# Patient Record
Sex: Female | Born: 1965 | Race: White | Hispanic: No | Marital: Single | Smoking: Current every day smoker
Health system: Southern US, Community
[De-identification: ages and names within clinical notes are randomized; demographics above are authoritative.]

## PROBLEM LIST (undated history)

## (undated) DIAGNOSIS — L680 Hirsutism: Secondary | ICD-10-CM

## (undated) DIAGNOSIS — K529 Noninfective gastroenteritis and colitis, unspecified: Secondary | ICD-10-CM

## (undated) DIAGNOSIS — F200 Paranoid schizophrenia: Secondary | ICD-10-CM

## (undated) DIAGNOSIS — R55 Syncope and collapse: Secondary | ICD-10-CM

## (undated) DIAGNOSIS — F209 Schizophrenia, unspecified: Secondary | ICD-10-CM

## (undated) DIAGNOSIS — G4733 Obstructive sleep apnea (adult) (pediatric): Secondary | ICD-10-CM

## (undated) DIAGNOSIS — G8929 Other chronic pain: Secondary | ICD-10-CM

## (undated) DIAGNOSIS — R7309 Other abnormal glucose: Secondary | ICD-10-CM

## (undated) DIAGNOSIS — F329 Major depressive disorder, single episode, unspecified: Secondary | ICD-10-CM

## (undated) DIAGNOSIS — Z9981 Dependence on supplemental oxygen: Secondary | ICD-10-CM

## (undated) DIAGNOSIS — R569 Unspecified convulsions: Secondary | ICD-10-CM

## (undated) DIAGNOSIS — N200 Calculus of kidney: Secondary | ICD-10-CM

## (undated) DIAGNOSIS — M7918 Myalgia, other site: Secondary | ICD-10-CM

## (undated) DIAGNOSIS — F32A Depression, unspecified: Secondary | ICD-10-CM

## (undated) DIAGNOSIS — K219 Gastro-esophageal reflux disease without esophagitis: Secondary | ICD-10-CM

## (undated) DIAGNOSIS — R51 Headache: Secondary | ICD-10-CM

## (undated) DIAGNOSIS — I2510 Atherosclerotic heart disease of native coronary artery without angina pectoris: Secondary | ICD-10-CM

## (undated) DIAGNOSIS — C3490 Malignant neoplasm of unspecified part of unspecified bronchus or lung: Secondary | ICD-10-CM

## (undated) DIAGNOSIS — J189 Pneumonia, unspecified organism: Secondary | ICD-10-CM

## (undated) DIAGNOSIS — N189 Chronic kidney disease, unspecified: Secondary | ICD-10-CM

## (undated) DIAGNOSIS — Z8619 Personal history of other infectious and parasitic diseases: Secondary | ICD-10-CM

## (undated) DIAGNOSIS — M5134 Other intervertebral disc degeneration, thoracic region: Secondary | ICD-10-CM

## (undated) DIAGNOSIS — C3412 Malignant neoplasm of upper lobe, left bronchus or lung: Secondary | ICD-10-CM

## (undated) DIAGNOSIS — Z87442 Personal history of urinary calculi: Secondary | ICD-10-CM

## (undated) DIAGNOSIS — I739 Peripheral vascular disease, unspecified: Secondary | ICD-10-CM

## (undated) DIAGNOSIS — R519 Headache, unspecified: Secondary | ICD-10-CM

## (undated) DIAGNOSIS — R030 Elevated blood-pressure reading, without diagnosis of hypertension: Secondary | ICD-10-CM

## (undated) DIAGNOSIS — F4310 Post-traumatic stress disorder, unspecified: Secondary | ICD-10-CM

## (undated) DIAGNOSIS — E66813 Obesity, class 3: Secondary | ICD-10-CM

## (undated) DIAGNOSIS — K625 Hemorrhage of anus and rectum: Secondary | ICD-10-CM

## (undated) DIAGNOSIS — Z860101 Personal history of adenomatous and serrated colon polyps: Secondary | ICD-10-CM

## (undated) DIAGNOSIS — J449 Chronic obstructive pulmonary disease, unspecified: Secondary | ICD-10-CM

## (undated) DIAGNOSIS — I890 Lymphedema, not elsewhere classified: Secondary | ICD-10-CM

## (undated) DIAGNOSIS — G2581 Restless legs syndrome: Secondary | ICD-10-CM

## (undated) DIAGNOSIS — M5030 Other cervical disc degeneration, unspecified cervical region: Secondary | ICD-10-CM

## (undated) DIAGNOSIS — F17210 Nicotine dependence, cigarettes, uncomplicated: Secondary | ICD-10-CM

## (undated) DIAGNOSIS — R590 Localized enlarged lymph nodes: Secondary | ICD-10-CM

## (undated) DIAGNOSIS — G4730 Sleep apnea, unspecified: Secondary | ICD-10-CM

## (undated) DIAGNOSIS — I872 Venous insufficiency (chronic) (peripheral): Secondary | ICD-10-CM

## (undated) HISTORY — DX: Schizophrenia, unspecified: F20.9

## (undated) HISTORY — DX: Other chronic pain: G89.29

## (undated) HISTORY — PX: TUBAL LIGATION: SHX77

## (undated) HISTORY — PX: OTHER SURGICAL HISTORY: SHX169

## (undated) HISTORY — PX: CHOLECYSTECTOMY: SHX55

## (undated) HISTORY — PX: KNEE SURGERY: SHX244

## (undated) HISTORY — PX: ESOPHAGOGASTRODUODENOSCOPY: SHX1529

## (undated) HISTORY — PX: CERVICAL SPINE SURGERY: SHX589

## (undated) HISTORY — DX: Gastro-esophageal reflux disease without esophagitis: K21.9

## (undated) HISTORY — PX: SPINE SURGERY: SHX786

## (undated) HISTORY — DX: Unspecified convulsions: R56.9

---

## 1998-02-26 ENCOUNTER — Emergency Department (HOSPITAL_COMMUNITY): Admission: EM | Admit: 1998-02-26 | Discharge: 1998-02-26 | Payer: Self-pay | Admitting: Emergency Medicine

## 1998-03-01 ENCOUNTER — Emergency Department (HOSPITAL_COMMUNITY): Admission: EM | Admit: 1998-03-01 | Discharge: 1998-03-01 | Payer: Self-pay | Admitting: Emergency Medicine

## 1998-03-09 ENCOUNTER — Emergency Department (HOSPITAL_COMMUNITY): Admission: EM | Admit: 1998-03-09 | Discharge: 1998-03-09 | Payer: Self-pay | Admitting: Emergency Medicine

## 2006-08-14 DIAGNOSIS — G8929 Other chronic pain: Secondary | ICD-10-CM | POA: Insufficient documentation

## 2006-11-06 ENCOUNTER — Emergency Department: Payer: Self-pay | Admitting: Emergency Medicine

## 2006-11-09 ENCOUNTER — Ambulatory Visit (HOSPITAL_BASED_OUTPATIENT_CLINIC_OR_DEPARTMENT_OTHER): Admission: RE | Admit: 2006-11-09 | Discharge: 2006-11-09 | Payer: Self-pay | Admitting: Orthopedic Surgery

## 2007-08-05 ENCOUNTER — Emergency Department: Payer: Self-pay | Admitting: Internal Medicine

## 2007-08-12 ENCOUNTER — Emergency Department: Payer: Self-pay | Admitting: Emergency Medicine

## 2007-10-17 ENCOUNTER — Emergency Department: Payer: Self-pay | Admitting: Emergency Medicine

## 2007-10-18 ENCOUNTER — Other Ambulatory Visit: Payer: Self-pay

## 2007-11-20 ENCOUNTER — Emergency Department: Payer: Self-pay | Admitting: Internal Medicine

## 2008-07-04 ENCOUNTER — Encounter: Admission: RE | Admit: 2008-07-04 | Discharge: 2008-07-09 | Payer: Self-pay | Admitting: Anesthesiology

## 2008-07-09 ENCOUNTER — Ambulatory Visit: Payer: Self-pay | Admitting: Anesthesiology

## 2008-07-19 ENCOUNTER — Ambulatory Visit (HOSPITAL_COMMUNITY): Admission: RE | Admit: 2008-07-19 | Discharge: 2008-07-19 | Payer: Self-pay | Admitting: Anesthesiology

## 2009-01-29 ENCOUNTER — Emergency Department: Payer: Self-pay | Admitting: Emergency Medicine

## 2009-10-16 ENCOUNTER — Ambulatory Visit: Payer: Self-pay | Admitting: Family Medicine

## 2009-10-30 ENCOUNTER — Ambulatory Visit: Payer: Self-pay | Admitting: Family Medicine

## 2010-07-21 NOTE — Op Note (Signed)
Brandi Fowler, Brandi Fowler              ACCOUNT NO.:  [id number]   MEDICAL RECORD NO.:  [id number]          PATIENT TYPE:  AMB   LOCATION:  DSC                          FACILITY:  [facility]   PHYSICIAN:  Mila Homer. Sherlean Foot, M.D. DATE OF BIRTH:  Aug 06, 1965   DATE OF PROCEDURE:  11/09/2006  DATE OF DISCHARGE:                               OPERATIVE REPORT   SURGEON:  Mila Homer. Sherlean Foot, M.D.   ASSISTANT:  None.   ANESTHESIA:  MAC.   PREOPERATIVE DIAGNOSIS:  Left knee internal derangement.   POSTOPERATIVE DIAGNOSIS:  Left knee plica syndrome.   INDICATIONS FOR PROCEDURE:  The patient is a 45 year old with a worker's  compensation injury to the knee and no improvement at three months but  with a normal MRI scan.  Diagnostic arthroscopy was suggested and she  elected for me to do the procedure.  Informed consent was obtained.   DESCRIPTION OF PROCEDURE:  The patient was laid supine and administered  MAC anesthesia.  Left leg was prepped and draped in usual sterile  fashion.  Inferolateral, inferomedial portals were created with a #11  blade, blunt trocar and cannula.  Diagnostic arthroscopy revealed no  chondromalacia about the knee.  There was a large synovitic plica on the  medial side.  This was debrided.  I think this was the only pathologic  lesion in the knee.  I checked all three compartments again.  ACL, PCL  were normal.  There were no loose bodies.  I then evacuated the knee of  fluid and instruments and then placed 4-0 nylon sutures in each portal  and dressed with Xeroform dressing sponges, sterile Webril and Ace wrap.   COMPLICATIONS:  None.   DRAINS:  None.           ______________________________  Mila Homer. Sherlean Foot, M.D.     SDL/MEDQ  D:  11/09/2006  T:  11/09/2006  Job:  [id number]

## 2010-07-21 NOTE — Assessment & Plan Note (Signed)
Br[facility].  I  evaluated him and reviewed the Health and History form and 14-point  review of systems.  I examined her, talked to her, and with her  permission case manager to the room.  She is an individual with  extensive medical history, left Fowler pain 45 years old, injured at work.  Date of injury June 2008 slip.  Reviewed records inclusive of Dr.  Wells Fowler assessment and Dr. Lynelle Fowler assessment, and I have reviewed  available imaging and the questions that are opposed to the assessment  today.  Ms. Brandi Fowler arrives here, and as I arrived  here on time, as I  entered the room, I find her in pajamas, somewhat disheveled, very  anxious and pressured.  We introduced each other, and began our initial  historical elements.  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  She frequently  see the emergency department, and gets injections for pain, and  apparently has been told by the ER they will not do that much anymore.  She relates her pain is 10/10, catastrophizes to most activities of  daily living.  She describes herself as almost invalid, unable to even  make sandwich.  She known __________ from a day ongoing pain, made worse  by walking, bending, sitting, standing; improves with heat and ice as  well as  TENS unit.  She has been on a multitude of medications, and she  is not sure which medicines help her.  She states she cannot even climbs  stairs, she cannot walk more than 5 minutes.  She sees herself as  totally disabled, unable to return to any kind of work.  She has  assistance with bathing, toileting, dressing, meal prep, household  duties, and shopping.  She relates this as numbness, tingling and some  weakness, although she has no breakaway.  Associated symptoms of neck  problems are also described as spondylitic, addressed as Dr. Wells Fowler  note, and attached to chart.   I reviewed available imaging, hard copy  and report.  A 14-point review of systems.  She denies significant  medical illnesses.   SOCIAL HISTORY:  She is single, lives alone, and we talked about the  legal history, she states that she has been convicted of crime.  She  states that those were mostly vehicle climbs, and has denied any other  issues here.  She is a smoker, now down to 1 pack a day from 2 packs per  day and she started at age 44.  She sees no relationship to her smoking  and her current difficulty with well health concerns.   SURGICAL HISTORY:  Arthroscopy to the left Fowler.   FAMILY HISTORY:  Diabetes, psychiatric disease, and disability.  The  psychiatric disease is undefined and I asked her specifically about  schizophrenia and bipolar disease.  She then relates to me that she was  recently diagnosed as having schizophrenia, her family doctors sending  her to Dr. Fannie Fowler and bridged her with antidepressants.  She states no wish  to harm self or others.  She states she has heard voices when she was  very young.   Review of systems and social history otherwise noncontributory, pain  problem.   PHYSICAL EXAMINATION:  GENERAL:  The patient is disheveled, somewhat  chaotic in appearance, and pressured speech.  She is oriented x3.  She  has  a limp with her gait, but it is variable.  HEENT:  Otherwise unremarkable with exception of poor dentition.  CHEST:  Clear to auscultation and percussion, increased AP diameter.  CARDIAC:  Regular rate and rhythm without rub, murmur, or gallop.  ABDOMEN:  Obese, benign, no hepatosplenomegaly.  MUSCULOSKELETAL:  She has diffuse suprascapular, paracervical, and  paralumbar myofascial with Fortin  test positive, distractible  myofascial elements.  I observed her Fowler, the left and right Fowler are  equal temperature, and no evidence of pseudomotor changes, full range of  motion, distractible to examination.  NEUROLOGIC:  She is intact  neurologically, and one ask for active  participation in weightbearing and movement, has exaggerated pain  behaviors.   IMPRESSION:  1. Osteoarthritis of the ankle.  2. Osteoarthritis of the Fowler.  3. Myofascial pain,  cervical lumbar.   PLAN:  1. I see no evidence of CRPS at this visit.  She describes pain, and      presents exaggerated to physical findings.  2. To be complete, we are going to order three-phase bone scan which      is nonspecific, but helpful in understanding washout effect, and if      this is not true a possibility for CRPS.  It is not pathognomonic.  3. As I go to questioning, it strikes me that she has either      undiagnosed schizoaffective disorder or bipolar disease.  This is      clearly contributory to her presentation today.  She comes very      anxious and agitated at simple question line that normally would      not be initiating a confrontational tone.  Also of note, she states      that she left Dr. Vear Fowler' office when they did not get along, and      in fact, Dr. Vear Fowler also had the same problem with some of her      compulsive activities.  I can see this is problematic as well.  4. Cigarette cessation is mandatory for best outcome.  I tried to      explained to her the contributory nature of this problem, and she      does not believe if that is the case.  5. I will see her in 2 weeks and have a better opportunity to      understand where to head next.  She has been through multiple      procedures, physical therapy, multiple medication trials, and she      wants to get out of pain.  We will work towards that goal, but it      is going to be approaching from multiple perspectives.  If she is      schizoaffective, we will unlikely proceed with any intervention,      and be very careful with what we prescribed.  The schizoaffective      and bipolar disease were not related to workman's comp.  Those were      preexisting, and not associated with  the injury.  6. Ms. Brandi Fowler was not entirely truthful with me when I asked her      about her arrest history.  She has been arrested for drug      paraphernalia, use and possession, possession of schedule 4;  DWI      and a previous possession of controlled substances, and when I      asked her about that she states  that they were wrong.  She was not      convicted, it was a friend of hers and, in fact, the offender      document does not clarify that.  She does have alias.   We will see her in 2 weeks and move forward with treatment  recommendations.  At this point, I am going to hold her out of work if I  do not see her for returning to work, by her own comments, although I do  think with proper treatment she might have a potential for return to  gainful employment.  Discharge instructions given.            ______________________________  Celene Kras, MD     HH/MedQ  D:  07/09/2008 11:51:44  T:  07/10/2008 01:46:38  Job #:  [id number]

## 2010-07-24 NOTE — Assessment & Plan Note (Signed)
Brandi Fowler has had a bone scan performed, three-phase.  I have  reviewed the report.  It appears that there is no evidence of CRPS,  supporting our clinical assumption, and we will relates this to the  patient.  Unfortunately, the patient has elected not to return, and the  door will remain open.           ______________________________  Celene Kras, MD     HH/MedQ  D:  08/27/2008 12:53:25  T:  08/28/2008 01:31:06  Job #:  [id number]

## 2010-08-18 ENCOUNTER — Ambulatory Visit: Payer: Self-pay | Admitting: Pain Medicine

## 2010-09-22 ENCOUNTER — Ambulatory Visit: Payer: Self-pay | Admitting: Pain Medicine

## 2010-12-18 LAB — POCT HEMOGLOBIN-HEMACUE: Operator id: 145061

## 2011-09-28 DIAGNOSIS — E669 Obesity, unspecified: Secondary | ICD-10-CM | POA: Diagnosis present

## 2011-12-08 ENCOUNTER — Ambulatory Visit: Payer: Self-pay | Admitting: Gastroenterology

## 2011-12-10 LAB — PATHOLOGY REPORT

## 2011-12-23 ENCOUNTER — Ambulatory Visit: Payer: Self-pay | Admitting: Emergency Medicine

## 2011-12-23 LAB — HEMOGLOBIN: HGB: 13.2 g/dL (ref 12.0–16.0)

## 2011-12-30 ENCOUNTER — Ambulatory Visit: Payer: Self-pay | Admitting: Emergency Medicine

## 2011-12-31 LAB — CBC
MCHC: 33 g/dL (ref 32.0–36.0)
MCV: 96 fL (ref 80–100)
RDW: 14 % (ref 11.5–14.5)

## 2011-12-31 LAB — COMPREHENSIVE METABOLIC PANEL
Albumin: 4 g/dL (ref 3.4–5.0)
Anion Gap: 9 (ref 7–16)
BUN: 14 mg/dL (ref 7–18)
Bilirubin,Total: 0.4 mg/dL (ref 0.2–1.0)
Chloride: 107 mmol/L (ref 98–107)
EGFR (African American): >60
Osmolality: 279 (ref 275–301)
Potassium: 3.9 mmol/L (ref 3.5–5.1)
SGOT(AST): 38 U/L — ABNORMAL HIGH (ref 15–37)
Sodium: 139 mmol/L (ref 136–145)
Total Protein: 7.6 g/dL (ref 6.4–8.2)

## 2012-01-01 ENCOUNTER — Ambulatory Visit: Payer: Self-pay | Admitting: Neurology

## 2012-01-01 LAB — HEPATIC FUNCTION PANEL A (ARMC)
Albumin: 3.7 g/dL (ref 3.4–5.0)
Alkaline Phosphatase: 89 U/L (ref 50–136)
Bilirubin,Total: 0.4 mg/dL (ref 0.2–1.0)
SGOT(AST): 24 U/L (ref 15–37)
SGPT (ALT): 38 U/L (ref 12–78)
Total Protein: 7.5 g/dL (ref 6.4–8.2)

## 2012-01-01 LAB — CBC WITH DIFFERENTIAL/PLATELET
Basophil %: 0.6 %
Eosinophil #: 0 10*3/uL (ref 0.0–0.7)
HCT: 39.8 % (ref 35.0–47.0)
HGB: 14 g/dL (ref 12.0–16.0)
Lymphocyte %: 7.8 %
Monocyte %: 5.7 %
Neutrophil #: 15.2 10*3/uL — ABNORMAL HIGH (ref 1.4–6.5)
Neutrophil %: 85.8 %
RBC: 4.13 10*6/uL (ref 3.80–5.20)
WBC: 17.8 10*3/uL — ABNORMAL HIGH (ref 3.6–11.0)

## 2012-01-01 LAB — URINALYSIS, COMPLETE
Bacteria: NONE SEEN
Blood: NEGATIVE
Ph: 5 (ref 4.5–8.0)
Protein: NEGATIVE
Specific Gravity: 1.021 (ref 1.003–1.030)
WBC UR: 2 /HPF (ref 0–5)

## 2012-01-02 ENCOUNTER — Inpatient Hospital Stay: Payer: Self-pay | Admitting: Emergency Medicine

## 2012-01-02 LAB — CBC WITH DIFFERENTIAL/PLATELET
Basophil %: 0.9 %
Eosinophil %: 1.7 %
HCT: 39.3 % (ref 35.0–47.0)
HGB: 13.3 g/dL (ref 12.0–16.0)
Lymphocyte #: 1.5 10*3/uL (ref 1.0–3.6)
MCV: 96 fL (ref 80–100)
Monocyte %: 7.6 %
Neutrophil #: 8.3 10*3/uL — ABNORMAL HIGH (ref 1.4–6.5)

## 2012-01-25 ENCOUNTER — Emergency Department: Payer: Self-pay | Admitting: Emergency Medicine

## 2012-01-25 LAB — CBC WITH DIFFERENTIAL/PLATELET
Basophil %: 0.7 %
Eosinophil #: 0.2 10*3/uL (ref 0.0–0.7)
Eosinophil %: 2.6 %
HGB: 13.2 g/dL (ref 12.0–16.0)
Lymphocyte #: 2.3 10*3/uL (ref 1.0–3.6)
MCH: 33.2 pg (ref 26.0–34.0)
MCHC: 35 g/dL (ref 32.0–36.0)
MCV: 95 fL (ref 80–100)
Monocyte #: 0.6 x10 3/mm (ref 0.2–0.9)
Neutrophil #: 5.7 10*3/uL (ref 1.4–6.5)
Neutrophil %: 64.4 %

## 2012-01-25 LAB — URINALYSIS, COMPLETE
Bilirubin,UR: NEGATIVE
Blood: NEGATIVE
Nitrite: POSITIVE
Ph: 5 (ref 4.5–8.0)
Specific Gravity: 1.024 (ref 1.003–1.030)
Squamous Epithelial: 7

## 2012-01-25 LAB — BASIC METABOLIC PANEL
BUN: 14 mg/dL (ref 7–18)
EGFR (African American): >60
EGFR (Non-African Amer.): >60
Glucose: 101 mg/dL — ABNORMAL HIGH (ref 65–99)
Sodium: 140 mmol/L (ref 136–145)

## 2012-01-25 LAB — PREGNANCY, URINE: Pregnancy Test, Urine: NEGATIVE m[IU]/mL

## 2012-02-02 ENCOUNTER — Ambulatory Visit: Payer: Self-pay | Admitting: Neurology

## 2012-03-24 ENCOUNTER — Ambulatory Visit: Payer: Self-pay | Admitting: Neurology

## 2012-03-24 LAB — CREATININE, SERUM
Creatinine: 0.58 mg/dL — ABNORMAL LOW (ref 0.60–1.30)
EGFR (African American): >60

## 2014-04-02 ENCOUNTER — Ambulatory Visit: Payer: Self-pay | Admitting: Pain Medicine

## 2014-04-08 ENCOUNTER — Ambulatory Visit: Payer: Self-pay | Admitting: Pain Medicine

## 2014-04-23 ENCOUNTER — Ambulatory Visit: Payer: Self-pay | Admitting: Pain Medicine

## 2014-05-07 ENCOUNTER — Ambulatory Visit: Payer: Self-pay | Admitting: Pain Medicine

## 2014-05-20 ENCOUNTER — Ambulatory Visit: Payer: Self-pay | Admitting: Pain Medicine

## 2014-06-18 ENCOUNTER — Ambulatory Visit
Admit: 2014-06-18 | Disposition: A | Discharge status: Home / Self Care | Payer: Self-pay | Attending: Pain Medicine | Admitting: Pain Medicine

## 2014-06-25 ENCOUNTER — Ambulatory Visit
Admit: 2014-06-25 | Disposition: A | Discharge status: Home / Self Care | Payer: Self-pay | Attending: Pain Medicine | Admitting: Pain Medicine

## 2014-06-25 NOTE — H&P (Signed)
PATIENT NAME:  Brandi Fowler, Brandi Fowler MR#:  [id number] DATE OF BIRTH:  04-18-1965  DATE OF ADMISSION:  12/31/2011  INDICATION FOR ADMISSION: Severe abdominal pain, hypoxia, mental status change.   CLINICAL NOTE: This 49 year old woman underwent elective cholecystectomy yesterday. Postoperative course was notable for poor p.o. tolerance and episodes of nausea through the night. As of this morning the patient was able to tolerate p.o. well, was voiding and had a bowel movement. At 11:00 a.m. her family member noted a change in her mental status for 2 or 3 minutes. She was unresponsive and she had no memory for the event. She was brought by rescue to the Emergency Department. Evaluation at that time is notable for moderate hypoxia with a saturation of 85%, improving to the high 90s on 3 liters. White blood cell count was noted to be 22,000 and she was appreciated to have right upper quadrant tenderness. She is admitted for further evaluation.   PAST MEDICAL HISTORY:  1. Long smoking history dating back almost 40 years. She is managed for chronic obstructive pulmonary disease and chronic back pain with long-acting narcotics through the [facility].  2. Depression.  3. Anxiety.  4. Schizophrenia. 5. Chronic back and knee pain.   ADMISSION MEDICATIONS:  1. Advair Diskus 500 mcg/50 mcg b.i.d.  2. Baclofen 20 mg q.i.d.  3. Gabapentin 600 mg t.i.d.  4. Metoclopramide 10 mg t.i.d.  5. Omeprazole 20 mg 2 p.o. daily. 6. Opana ER extended release 40 mg q.12h.  7. Oxycodone/Percocet p.r.n.  8. ProAir HFA 90 mcg 2 puffs twice a day if needed.   MEDICATION ALLERGIES: Penicillin with swelling and rash.   PHYSICAL EXAMINATION:  GENERAL: Examination showed the patient to be awake, alert, and oriented. She could describe events prior to and after the "seizure" described by family members.   VITAL SIGNS ON ADMISSION: Temperature 96.8 with a pulse 121, respirations 44, blood pressure 143/76, saturation 84%.  Most recent blood pressure completed one hour after admission showed a pulse 99, blood pressure 114/68 and sat of 96%.   HEAD AND NECK: Unremarkable. Sclerae were clear. Mucous membranes were moist.   LUNGS: Decreased breath sounds on the right base. No wheezes, rales, or rhonchi. No inhibition to deep inspiration.   CARDIAC: Mild tachycardia with a rate of 120. No murmurs or gallops.   ABDOMEN: Modestly distended/obese. Decreased bowel sounds were noted. She showed minimal discomfort with pressure on the left side. No referred pain. She did show tenderness in the right upper quadrant. Port site dressings were intact.   EXTREMITIES: Femoral pulses were intact as were pedal pulses. No peripheral edema was appreciated.   LABORATORY, RADIOLOGICAL AND DIAGNOSTIC DATA: Plain AP chest x-ray showed no free air or evidence of significant infiltrate. The radiologist reported bibasilar atelectasis. Plain films of the abdomen showed nonspecific gas pattern with a few scantly dilated small bowel loops. No air-fluid levels. No free air. Ultrasound of the right upper quadrant showed no evidence of fluid collection. Common bile duct 0.75 cm.   Laboratory studies showed a white blood cell count of 22,600 with a hemoglobin of 13.9 and a platelet count of 324,000. Liver function studies were notable only for scant elevation of SGOT at 38 (upper limit of normal 37). Basic metabolic panel is notable for an elevated blood sugar of 112, creatinine 0.7.   ASSESSMENT AND PLAN: The etiology of the patient's severe pain is unclear. The white blood cell count may be secondary to nausea and vomiting described  overnight and mild dehydration. No preprocedure labs are available. The patient's attending surgeon was unavailable to admit the patient. I did speak with him by phone. He described the procedures as being uneventful and normal cholangiograms. These have indeed been reviewed. The cystic duct was fairly prominent as was the  remaining biliary tree. No evidence of bile extravasation on plain films from the cholangiogram. Of note, the contrast from the cholangiograms is within the right colon.   The patient will be admitted and a medical consultation obtained for the reported seizure disorder as well as ongoing management of her pulmonary dysfunction.   ____________________________ Robert Bellow, MD jwb:ap D: 12/31/2011 16:50:04 ET T: 12/31/2011 17:20:31 ET JOB#: [id number]  cc: Robert Bellow, MD, <Dictator> Valera Castle, MD Masud S. Phylis Bougie, MD  Epsie Walthall Amedeo Kinsman MD ELECTRONICALLY SIGNED 01/02/2012 12:28

## 2014-06-25 NOTE — Consult Note (Signed)
PATIENT NAME:  Brandi Fowler, Brandi Fowler MR#:  [id number] DATE OF BIRTH:  [birth year]  DATE OF CONSULTATION:  01/01/2012  REFERRING PHYSICIAN:   CONSULTING PHYSICIAN:  Leotis Pain, MD  HISTORY OF PRESENT ILLNESS: This is a 49 year old female postoperative day two status post elective cholecystectomy which was performed laparoscopically. Postoperative patient was noted to have poor p.o. intact, nausea and vomiting, being sent to the hospital with a brief period of altered mental status and suspected to be possible seizure activity. When she came into the Emergency Department she was noted to have moderate hypoxia with saturation about 85%. She was noted also to have white blood cell count of 22,000 and complained of diffuse pain mostly in her abdomen. When questioned about seizure activity patient states that she doesn't remember the event but was told by her boyfriend that she stiffened up for a few minutes and wouldn't respond to him. At that time there was no tongue biting, no urinary incontinence. While admitted to the hospital patient had CT chest with pulmonary embolus protocol because of signs of hypoxia which was a negative study. She had a CAT scan of the head which did not show any acute abnormalities. While in nuclear medicine she had a suspected seizure activity which was not documented. Postoperative because of the pain she was started Dilaudid. At home she chronically takes oxycodone.   PAST MEDICAL HISTORY: 1. Long term history of smoking, at least 40 years.  2. Depression, anxiety, schizophrenia.  3. Chronic back and knee pain.   MEDICATIONS ON ADMISSION: 1. Advair. 2. Baclofen. 3. Gabapentin.  4. Metoclopramide.  5. Omeprazole.  6. Opana ER extended release. 7. Oxycodone.  8. Percocet. 9. ProAir.   LABORATORY, DIAGNOSTIC AND RADIOLOGICAL DATA: Patient had CT abdomen and pelvis that showed only suspicious bilateral pulmonary atelectasis. CT chest with pulmonary embolus protocol as  described above. No signs of pulmonary embolism. Patient was admitted with a white blood cell count of 22,000, overnight it decreased to 17.8  SOCIAL HISTORY: Described above. She is a chronic smoker.   FAMILY HISTORY: Noncontributory.   PHYSICAL EXAMINATION: VITAL SIGNS: Temperature 98.2, pulse 117, blood pressure 115/79, pulse ox 90% to 94% on 2 liters nasal cannula.   NEUROLOGICAL: Patient is able to tell me the day of the week but was unable to tell me the month and the date, was able to tell me the year and the president. Cranial nerve examination pupils are symmetrical and reactive bilaterally. Extraocular movements are intact. Visual fields appear to be intact. Sensation is intact bilaterally. Tongue is midline. Palate elevates symmetrically. Shoulder shrub is intact bilaterally. Motor strength examination 4+/5 bilateral upper extremities. She appears to be 4- on the lower extremities bilaterally as this is due to pain in her abdomen. There is normal sensation to light touch and temperature. Reflexes are 2+ symmetrically. Coordination, finger to nose intact. Gait was not assessed.  ASSESSMENT: This is a 49 year old female status post cholecystectomy presents with nausea, vomiting, altered mental status and suspected two episodes of seizure activity. Of note, patient was started on hydromorphone because of pain which can lower seizure threshold.   PLAN: Limit opioid use as it can lower seizure threshold. At the same time we are not sure if this was definitely provoked by Dilaudid use. Rule out signs of infection as patient's white count is over 17 today but it is declined, also less likely to cause seizure activity. Would start patient on Keppra dose of 750 mg twice a day as  there is no renal impairment. Would obtain an EEG and MRI of the brain which could be done as outpatient. Patient was told not to drive and to call [employer] to report he suspected seizure activity.  Thank you. It was a pleasure  seeing this patient.  ____________________________ Leotis Pain, MD yz:cms D: 01/01/2012 13:07:37 ET T: 01/01/2012 13:49:02 ET  JOB#: [id number] Leotis Pain MD ELECTRONICALLY SIGNED 02/22/2012 19:06

## 2014-06-25 NOTE — Consult Note (Signed)
Brief Consult Note: Comments: 49 yr old female s/p lapchole ,yesterday with hypoxic respiratory failure d/d include possible aspiration pneumonitis with  nausea,vomitng and h/o genral anesthesia,and taking pain meds;cover iwth clinda,o2,advair,proair also evaluate for PE as she is hypoxc and tachycardia:CT chest ordered possible seizure acivity at home and witnessed seizure while in nuclear medcicine;ct chead,ativan prn, hypoxia related seizure?EEG,neuro consult,ativan.  Electronic Signatures: Epifanio Lesches (MD)  (Signed 25-Oct-13 18:02)  Authored: Brief Consult Note   Last Updated: 25-Oct-13 18:02 by Epifanio Lesches (MD)

## 2014-06-25 NOTE — Consult Note (Signed)
PATIENT NAME:  Brandi Fowler, FORS MR#:  [id number] DATE OF BIRTH:  09-23-1965  DATE OF CONSULTATION:  12/31/2011  REFERRING PHYSICIAN:  Hervey Ard, MD CONSULTING PHYSICIAN:  Epifanio Lesches, MD  REASON FOR CONSULT: Shortness of breath and hypoxia.   HISTORY OF PRESENT ILLNESS: The patient is a 49 year old female status post laparoscopic cholecystectomy yesterday by Dr. Phylis Bougie. She came in this morning because the patient had abdominal pain. According to fiance the patient had some spaghetti around 8 and after that she went to sleep.  Her fiance noted her shoulders and body tensed.  She looked like she was about to have a seizure, and at that time she also had some drooling from her mouth.  No tongue biting, no jerky movements, just stiffness. No tonoclonic seizure was identified.  Oxygen saturations were 84% when EMS arrived.  They put her on three liters and sats went up to 91. At this time in the ER saturations were around 90 on 4 liters. The patient is awake, alert, and oriented, complains of abdominal pain. The patient had general anesthesia yesterday for surgery and after that she went home. The patient had some pain last night with nausea, unable to take anything last night.  This morning she ate some spaghetti.  She is on chronic pain medications and she is on Opana 40 mg extended release along with oxycodone.  The patient also is on  Percocet 5/325.  The patient called? the pain medicines this morning. The episode started around noon.  I was asked to see her for hypoxia. The patient has no cough, no fever. She feels short of breath with minimal activity. No leg pain.   PAST MEDICAL HISTORY:  1. History of chronic obstructive pulmonary disease. 2. Chronic back pain. 3. History of neuropathy.   ALLERGIES: She is allergic to penicillin.   SOCIAL HISTORY: Smokes about 1-1/2 packs per day. She started to smoke at the age of [age].  No alcohol. No drugs.   FAMILY HISTORY: No hypertension or  diabetes.   PAST SURGICAL HISTORY:  1. Disc fusion in the back. 2. Cholecystectomy.  3. Tubal ligation.   MEDICATIONS:  1. Advair 500/50, 1 puff twice a day.  2. Baclofen 20 mg 4 times daily.  3. Gabapentin 600 mg p.o. t.i.d.  4. Reglan 10 mg p.o. t.i.d.  5. Omeprazole 20 mg p.o. 2 times daily. 6. Opana ER 40 mg p.o. q.12 hours.  7. Oxycodone 10 mg p.o. 4 times daily as needed for pain. 8. Percocet 5/325, 1 tablet every four hours as needed for pain, started yesterday. 9. ProAir 2 puffs 2 times daily as needed for shortness of breath.    REVIEW OF SYSTEMS: CONSTITUTIONAL: Has fatigue. EYES: No blurred vision. ENT: No tinnitus. No epistaxis. No difficulty swallowing. RESPIRATORY: Has shortness of breath with minimal activity. No cough. CARDIOVASCULAR: No chest pain. GI: Has abdominal pain and nausea, abdominal pain mainly in the right upper quadrant. GU: No dysuria. ENDOCRINE: No polyuria or nocturia. INTEGUMENT: No skin rashes. MUSCULOSKELETAL: Complains of back pain. NEUROLOGIC: The patient has no numbness or weakness and no headache. No Postictal signs. PSYCH: No anxiety or insomnia.   PHYSICAL EXAMINATION:  VITAL SIGNS: Temperature 96.8, pulse 121, respirations 44, blood pressure 133/76, O2 sats right now she is on 4 liters saturating around 90,  but 84 on room air.  GENERAL:  Alert, awake, and oriented, not in distress.   HEENT: Head atraumatic, normocephalic. Pupils equally reacting to light. Extraocular movements intact.  ENT: No tympanic membrane congestion. No turbinate hypertrophy. No oropharyngeal erythema.   NECK: Normal range of motion. No JVD. No carotid bruit.  CARDIOVASCULAR: S1 and S2 regular. No murmurs. PMI not displaced. Good pedal and femoral pulses. No extremity edema. Slightly tachycardic.   RESPIRATORY: Diminished air entry bilaterally but no wheezing.   ABDOMEN: Slightly tender in epigastric, right upper quadrant. Dressing present status post   surgery  yesterday.   MUSCULOSKELETAL: Strength 5/5 upper and lower extremities.   SKIN: No skin rashes.   NEUROLOGICAL: Cranial nerves II through XII intact. Power 5/5 in upper and lower extremities. Sensations are intact. Deep tendon reflexes 2+ bilaterally.   PSYCH: Oriented to time, place, and person.   LABORATORY, DIAGNOSTIC, AND RADIOLOGIC DATA: Abdominal ultrasound shows no abdominal fluid collection in the gallbladder? and mild dilation of CBD, not unusual for post cholecystectomy state. pH 7.38,  pCO2 38, pO2 is 67. Chest x-ray shows findings of bibasilar atelectasis and follow-up chest x-ray AP and lateral views useful. WBC up to 22.6, hemoglobin 13.9, hematocrit 42.1, platelets 324, sodium 139, potassium 3.9, chloride 107, bicarbonate 23, BUN 14, creatinine 0.7, glucose 112. LFTs within normal limits.   ASSESSMENT AND PLAN:  1. This is a 49 year old female status post cholecystectomy, found to have hypoxic respiratory failure likely secondary to aspiration because of general anesthesia and also nausea and vomiting episodes. The patient will be hydrated. Repeat chest x-ray tomorrow. Also we have to rule out pulmonary emboli because of tachycardia and hypoxia. A CT of the chest has been ordered. Right now she has already received a dose of clindamycin in the Emergency Room. We will put her on Zosyn and continue oxygen to keep sats around 90.  2. History of smoking with chronic obstructive pulmonary disease.  Her lungs have no wheezing today.  Continue Advair that she takes. 3. Abdominal pain status post surgery. She is on hydromorphone. Seen by surgery, Dr. Bary Castilla. The patient is admitted to their service for possible biliary leak. The patient is on Zofran and pain medicines and clear liquid diet according to them. 4. Chronic pain. She is on Dilaudid at this time, so hold Opana. Can continue her baclofen and gabapentin if needed.      TIME SPENT: Time spent on consult including reviewing  documentation and speaking with surgeon  took more than 60 minutes.     ____________________________ Epifanio Lesches, MD sk:bjt D: 12/31/2011 17:02:56 ET T: 01/01/2012 12:17:37 ET JOB#: [id number]  cc: Epifanio Lesches, MD, <Dictator> Epifanio Lesches MD ELECTRONICALLY SIGNED 01/18/2012 19:41

## 2014-06-25 NOTE — Op Note (Signed)
PATIENT NAME:  Brandi Fowler, Brandi Fowler MR#:  [id number] DATE OF BIRTH:  1965/06/24  DATE OF PROCEDURE:  12/30/2011  PREOPERATIVE DIAGNOSIS: Acute cholecystitis.  POSTOPERATIVE DIAGNOSIS: Acute cholecystitis.  PROCEDURE: Laparoscopic cholecystectomy with cholangiogram.   HISTORY: This patient was brought to surgery. After putting her to sleep, a small incision was made above the umbilicus. After cutting skin and subcutaneous tissue, the abdomen was entered through with direct vision and Hassan trocar was inserted. Another trocar was inserted in the epigastric region, two 5 mm put in the right upper quadrant of the abdomen. First of all the gallbladder was found to have multiple adhesions. Those were released and the gallbladder was then lifted up. Dissection was done over the gallbladder/cystic duct junction and the cystic duct was then found out and the cystic artery was then clipped and cut. The cystic duct was then held with a clamp and a cholangiogram was performed which was normal. The cystic duct was then clipped and cut and the gallbladder was then removed from the liver bed without any problem. After the gallbladder was removed, we checked out to make sure there was no bleeding and there was no biliary leak. All the trocars were removed under direct vision. The gallbladder was recovered in a bag and pulled out through the belly button. The patient's skin was closed with staples. She tolerated the procedure well and was sent to the recovery room in satisfactory condition.  ____________________________ Welford Roche Phylis Bougie, MD msh:slb D: 12/30/2011 15:05:12 ET T: 12/30/2011 15:16:30 ET JOB#: [id number]  cc: Abhiraj Dozal S. Phylis Bougie, MD, <Dictator> Valera Castle, MD Sharene Butters MD ELECTRONICALLY SIGNED 01/04/2012 12:49

## 2014-06-26 ENCOUNTER — Ambulatory Visit
Admit: 2014-06-26 | Disposition: A | Discharge status: Home / Self Care | Payer: Self-pay | Attending: Pain Medicine | Admitting: Pain Medicine

## 2014-07-30 ENCOUNTER — Ambulatory Visit: Payer: Medicare Other | Attending: Pain Medicine | Admitting: Pain Medicine

## 2014-07-30 ENCOUNTER — Telehealth: Payer: Self-pay | Admitting: Pain Medicine

## 2014-07-30 ENCOUNTER — Encounter: Payer: Self-pay | Admitting: Pain Medicine

## 2014-07-30 VITALS — BP 109/73 | HR 88 | Temp 98.2°F | Resp 18 | Ht 65.0 in | Wt 239.0 lb

## 2014-07-30 DIAGNOSIS — M5134 Other intervertebral disc degeneration, thoracic region: Secondary | ICD-10-CM | POA: Insufficient documentation

## 2014-07-30 DIAGNOSIS — M542 Cervicalgia: Secondary | ICD-10-CM | POA: Diagnosis present

## 2014-07-30 DIAGNOSIS — Z9889 Other specified postprocedural states: Secondary | ICD-10-CM | POA: Diagnosis not present

## 2014-07-30 DIAGNOSIS — M546 Pain in thoracic spine: Secondary | ICD-10-CM | POA: Diagnosis present

## 2014-07-30 DIAGNOSIS — G588 Other specified mononeuropathies: Secondary | ICD-10-CM | POA: Insufficient documentation

## 2014-07-30 DIAGNOSIS — M545 Low back pain: Secondary | ICD-10-CM | POA: Diagnosis present

## 2014-07-30 DIAGNOSIS — M47816 Spondylosis without myelopathy or radiculopathy, lumbar region: Secondary | ICD-10-CM

## 2014-07-30 DIAGNOSIS — M5030 Other cervical disc degeneration, unspecified cervical region: Secondary | ICD-10-CM | POA: Insufficient documentation

## 2014-07-30 DIAGNOSIS — M47894 Other spondylosis, thoracic region: Secondary | ICD-10-CM | POA: Insufficient documentation

## 2014-07-30 DIAGNOSIS — M47814 Spondylosis without myelopathy or radiculopathy, thoracic region: Secondary | ICD-10-CM

## 2014-07-30 DIAGNOSIS — M51369 Other intervertebral disc degeneration, lumbar region without mention of lumbar back pain or lower extremity pain: Secondary | ICD-10-CM | POA: Insufficient documentation

## 2014-07-30 DIAGNOSIS — M5136 Other intervertebral disc degeneration, lumbar region: Secondary | ICD-10-CM | POA: Insufficient documentation

## 2014-07-30 NOTE — Patient Instructions (Addendum)
Continue present medications.. As discussed we will call Dr. Dara Lords your neurologist to inform him that we would prefer that he prescribed medications for treatment of your pain since we feel that you are at significant risk if you take such medication  F/U PCP for evaliation of  BP and general medical  condition.  F/U surgical evaluation.  F/U neurological evaluation.  May consider radiofrequency rhizolysis or intraspinal procedures pending response to present treatment and F/U evaluation.  Patient to call [facility] should patient have concerns prior to scheduled return appointment.

## 2014-07-30 NOTE — Progress Notes (Signed)
   Subjective:    Patient ID: Brandi Fowler, female    DOB: 1965-12-24, 49 y.o.   MRN: [id number]  HPI    Review of Systems     Objective:   Physical Exam        Assessment & Plan:

## 2014-07-30 NOTE — Progress Notes (Signed)
   Subjective:    Patient ID: Brandi Fowler, female    DOB: 21-Aug-1965, 49 y.o.   MRN: [id number]  HPI  Patient is 49 year old female returns to [place] for further evaluation and treatment of pain occurring in the upper mid and lower back region and the cervical region as well. Days visit we discussed patient's medication informed patient that we would prefer to have patient's neurologist prescribed medication for treatment of patient's pain and consideration of patient's seizures and general medical condition. The patient  provided [place] with the  name of Dr. Dara Lords her neurologist and we informed Dr. Dara Lords of our desire to have him prescribed medication for treatment of patient's pain is felt the patient was at significant risk due to her seizures and general medical condition. The patient and daughter were understanding and in agreement with suggested treatment plan.  Review of Systems     Objective:   Physical Exam  There was tenderness over the splenius capitis and occipitalis muscles of moderate degree. With moderate tenderness over the region of the cervical facet cervical paraspinal musculature regions. Palpation over the thoracic facet thoracic paraspinal musculature region with tinged palpation of moderately severe degree especially the upper and mid thoracic regions and the region of the subscapular area. There was a well-healed surgical scar the cervical region anteriorly without increased warmth or erythema in the region of the scar. There was unremarkable Spurling's maneuver and patient appeared to be with slightly decreased grip strength Tinel and Phalen's maneuver without increase of pain of significant degree. Facets lumbar paraspinal muscular region was a tends to palpation of moderate degree there was no tenderness over the region of the greater trochanteric region of significant degree. Straight leg raising tolerates approximately 20 without increased pain with  dorsiflexion noted. Abdomen was nontender and no costovertebral angle tenderness was noted.      Assessment & Plan:    Degenerative disc disease cervical spine Status post surgical intervention cervical region  Degenerative disc disease thoracic spine  Thoracic facet syndrome  Intercostal neuralgia    Plan   Continue present medications.. At this time patient is to follow-up with Dr. Dara Lords her neurologist to discuss medications. In consideration of patient's seizures in general medical condition we we'll avoid prescribing medications for treatment of patient's condition. Patient is understanding and will follow-up with Dr. Dara Lords as we discussed  F/U PCP for evaliation of  BP and general medical  condition.  F/U surgical evaluation.  F/U neurological evaluation.  May consider radiofrequency rhizolysis or intraspinal procedures pending response to present treatment and F/U evaluation.  Patient to call [facility] should patient have concerns prior to scheduled return appointment.

## 2014-07-30 NOTE — Progress Notes (Signed)
Discharged at 1135, ambulatory.

## 2014-07-30 NOTE — Telephone Encounter (Signed)
Pt does not understand why she cannot get something to help with her pain and why does she need to go to neurologist as they will not help her with pain. Please call to discuss with patient .

## 2014-08-01 NOTE — Telephone Encounter (Signed)
Juliann Pulse  Please refer this message to [place] . As explained to patient and patient's daughter at time of her previous appointment due to patient's seizures which have become increasingly more frequent I am reluctant to prescribe any medications for this patient. Patient clearly understood this and gave me the name of her neurologist Dr. Dara Lords because I told her that I would not be prescribing medications for her and felt that her neurologist may be willing to prescribe medications for her since he was treating her seizures. However I did not tell the patient that the neurologist will prescribe medications for her. I told the patient that she may also need to be referred to a tertiary pain clinic for further evaluation and treatment and that the tertiary pain clinic may be willing to prescribe medications for her condition both the patient and her daughter were with a clear understanding that the reason that I was not going to prescribe medications for her was due to her seizures which are becoming increasingly more frequent and because of her general medical condition. I also informed the patient that I would not perform any additional procedures at this time however I would consider a thoracic epidural steroid injection if the patient wished for me to do such. Please discuss this with me so that we can assist the patient. At this time the patient was to see her neurologist Dr. Dara Lords and was going to see if Dr. Dara Lords was willing to prescribe medications for treatment of her pain. I remain available to discuss additional options for this patient as I also explained to the patient and her daughter on the day of her previous visit Thank you

## 2014-09-03 ENCOUNTER — Ambulatory Visit: Payer: Medicare Other | Attending: Pain Medicine | Admitting: Pain Medicine

## 2014-09-03 ENCOUNTER — Encounter: Payer: Self-pay | Admitting: Pain Medicine

## 2014-09-03 ENCOUNTER — Other Ambulatory Visit: Payer: Self-pay | Admitting: Unknown Physician Specialty

## 2014-09-03 VITALS — BP 114/80 | HR 79 | Temp 97.9°F | Resp 20 | Ht 65.0 in | Wt 240.0 lb

## 2014-09-03 DIAGNOSIS — G588 Other specified mononeuropathies: Secondary | ICD-10-CM | POA: Diagnosis not present

## 2014-09-03 DIAGNOSIS — Z981 Arthrodesis status: Secondary | ICD-10-CM | POA: Diagnosis not present

## 2014-09-03 DIAGNOSIS — M5134 Other intervertebral disc degeneration, thoracic region: Secondary | ICD-10-CM

## 2014-09-03 DIAGNOSIS — G40909 Epilepsy, unspecified, not intractable, without status epilepticus: Secondary | ICD-10-CM | POA: Insufficient documentation

## 2014-09-03 DIAGNOSIS — M47816 Spondylosis without myelopathy or radiculopathy, lumbar region: Secondary | ICD-10-CM

## 2014-09-03 DIAGNOSIS — M79602 Pain in left arm: Secondary | ICD-10-CM | POA: Diagnosis present

## 2014-09-03 DIAGNOSIS — F209 Schizophrenia, unspecified: Secondary | ICD-10-CM | POA: Diagnosis not present

## 2014-09-03 DIAGNOSIS — M47894 Other spondylosis, thoracic region: Secondary | ICD-10-CM

## 2014-09-03 DIAGNOSIS — M79601 Pain in right arm: Secondary | ICD-10-CM | POA: Diagnosis present

## 2014-09-03 DIAGNOSIS — M5030 Other cervical disc degeneration, unspecified cervical region: Secondary | ICD-10-CM | POA: Insufficient documentation

## 2014-09-03 DIAGNOSIS — M542 Cervicalgia: Secondary | ICD-10-CM | POA: Diagnosis present

## 2014-09-03 DIAGNOSIS — M47814 Spondylosis without myelopathy or radiculopathy, thoracic region: Secondary | ICD-10-CM

## 2014-09-03 DIAGNOSIS — M5136 Other intervertebral disc degeneration, lumbar region: Secondary | ICD-10-CM

## 2014-09-03 DIAGNOSIS — R112 Nausea with vomiting, unspecified: Secondary | ICD-10-CM

## 2014-09-03 NOTE — Progress Notes (Signed)
Subjective:    Patient ID: Brandi Fowler, female    DOB: 1966/02/18, 49 y.o.   MRN: [id number]  HPI  Patient is 49 year old female returns to [place] for further evaluation and treatment of pain involving the neck upper extremity regions upper back region. Today's visit we discussed patient's condition and explained to patient that we would prefer to avoid prescribing medications for treatment of her pain and consideration of patient's seizure disorder and general medical condition. Patient was accompanied by her daughter on today's visit and this was explained to the daughter as well. We had charge nurse Ms. Jocelyn Lamer knees, and into the exam room as well to explained to patient. I'll be prefer to avoid prescribing medications for treatment of patient's condition. At this time we recommend patient return to Dr.Olmedo to discuss referral to [facility] pain clinic or other tertiary pain clinic for treatment of patient's condition. We also informed patient that if patient had a wrist excessive response to the treatment regimen at [facility] other facility after 3 months that we will consider assuming the treatment of the patient . The patient stated that she was told that surgery of her neck would get rid of the pain between her shoulder blades and that she was also told that her gallbladder surgery would get rid of the pain between her shoulder blades. The patient was quite frustrated as stated that she is just being caused from 1 Dr. to another. After a rather lengthy discussion with the patient and patient's daughter the patient was understanding and agreed with suggested treatment plan to see with evaluation at [facility] or other tertiary pain clinic All understanding and in agreement with suggested treatment plan.      Review of Systems     Objective:   Physical Exam  There was tenderness of the splenius capitis and occipitalis regions of mild to moderate degree. With no new lesions of the head  and neck noted. Patient appeared to be with unremarkable Spurling's maneuver. Patient was with slightly decreased grip strength. Tinel and Phalen's maneuver were without increase of pain of significant degree. Palpation over the trapezius levator scapula rhomboid musculature regions reproduced severe pain. There was tenderness over the lumbar paraspinal musculature region lumbar facet region a moderate degree. Palpation of the PSIS and PII S region reproduced mild discomfort. Straight leg raising was tolerates approximately 30 without increased pain with dorsiflexion noted. No sensory deficit of dermatomal distribution was detected. There was negative clonus negative Homans. Abdomen nontender and no costovertebral angle tenderness noted.      Assessment & Plan:  Degenerative disc disease cervical spine Status post C5-6 and C7 anterior cervical fusion with C5-6 level solidly fused right facet arthritis at C3-4  Cervical facet syndrome  Intercostal neuralgia  Seizure disorder  Schizophrenia     Plan   Continue present medications. We informed patient that we would be unable to prescribed medications for treatment of her pain and consideration of her first seizure disorder and other medical conditions. Therefore we recommended patient return to Dr.Olmedo to discuss referral to [facility] are other tertiary pain clinic for treatment. We will consider treatment of patient and patient has successfully response to treatment regimen at [facility] and continues to have successful response for 3 months after which we will consider assuming patient's care  F/U PCP for evaliation of  BP and general medical  Condition.  Cervical epidural steroid injection or cervical facet, medial branch nerve, blocks to be performed should patient wished  to proceed with such treatment.  F/U surgical evaluation. Patient to undergo neurosurgical reevaluation as discussed   F/U neurological evaluation Dr. Dara Lords and Dr.  Manuella Ghazi  F/U psych evaluation with Dr. Jacqualine Code as discussed  May consider radiofrequency rhizolysis or intraspinal procedures pending response to present treatment and F/U evaluation.  Patient to call [facility] Management Center should patient have concerns prior to scheduled return appointment.

## 2014-09-03 NOTE — Patient Instructions (Addendum)
Continue present medications. We are unable to prescribed medications for treatment of your pain at this time and recommend that you return to Dr. Astrid Divine or Dr Kym Groom to discuss referral to a tertiary pain clinic for evaluation and treatment of your pain   F/U PCP, Dr.Aldridge or Dr Kym Groom,  for evaliation of  BP and general medical  Condition.  F/U site with Dr. Jacqualine Code  F/U surgical evaluation.  F/U neurological evaluation with Dr. Dara Lords as discussed follow-up Dr. Manuella Ghazi as needed  May consider radiofrequency rhizolysis or intraspinal procedures pending response to present treatment and F/U evaluation.  Patient to call [facility] Management Center should patient have concerns prior to scheduled return appointment.

## 2014-09-03 NOTE — Progress Notes (Signed)
Safety precautions to be maintained throughout the outpatient stay will include: orient to surroundings, keep bed in low position, maintain call bell within reach at all times, provide assistance with transfer out of bed and ambulation.  

## 2014-09-05 ENCOUNTER — Ambulatory Visit: Payer: Medicare Other

## 2014-09-12 ENCOUNTER — Encounter
Admission: RE | Admit: 2014-09-12 | Discharge: 2014-09-12 | Disposition: A | Discharge status: Home / Self Care | Payer: Medicare Other | Source: Ambulatory Visit | Attending: Unknown Physician Specialty | Admitting: Unknown Physician Specialty

## 2014-09-12 DIAGNOSIS — R112 Nausea with vomiting, unspecified: Secondary | ICD-10-CM | POA: Insufficient documentation

## 2014-09-12 MED ORDER — TECHNETIUM TC 99M SULFUR COLLOID
2.0000 | Freq: Once | INTRAVENOUS | Status: AC | PRN
  Administered 2014-09-12: 2.16 via INTRAVENOUS

## 2014-09-26 ENCOUNTER — Other Ambulatory Visit
Admission: RE | Admit: 2014-09-26 | Discharge: 2014-09-26 | Disposition: A | Discharge status: Home / Self Care | Payer: Medicare Other | Attending: Nurse Practitioner | Admitting: Nurse Practitioner

## 2014-09-26 DIAGNOSIS — R197 Diarrhea, unspecified: Secondary | ICD-10-CM | POA: Insufficient documentation

## 2014-09-28 LAB — WBCS, STOOL: WBCs, Stool: NONE SEEN

## 2014-09-29 LAB — STOOL CULTURE

## 2014-09-30 ENCOUNTER — Other Ambulatory Visit
Admission: RE | Admit: 2014-09-30 | Discharge: 2014-09-30 | Disposition: A | Discharge status: Home / Self Care | Payer: Medicare Other | Attending: Nurse Practitioner | Admitting: Nurse Practitioner

## 2014-09-30 DIAGNOSIS — R197 Diarrhea, unspecified: Secondary | ICD-10-CM | POA: Insufficient documentation

## 2014-09-30 LAB — C DIFFICILE QUICK SCREEN W PCR REFLEX
C Diff antigen: NEGATIVE
C Diff toxin: NEGATIVE

## 2014-10-02 LAB — GIARDIA, EIA; OVA/PARASITE: Giardia Ag, Stl: NEGATIVE

## 2014-10-02 LAB — O&P RESULT

## 2014-11-18 ENCOUNTER — Ambulatory Visit: Payer: Medicare Other | Admitting: Anesthesiology

## 2014-11-18 ENCOUNTER — Encounter: Payer: Self-pay | Admitting: *Deleted

## 2014-11-18 ENCOUNTER — Encounter
Admission: RE | Disposition: A | Discharge status: Home / Self Care | Payer: Self-pay | Source: Ambulatory Visit | Attending: Unknown Physician Specialty

## 2014-11-18 ENCOUNTER — Ambulatory Visit
Admission: RE | Admit: 2014-11-18 | Discharge: 2014-11-18 | Disposition: A | Discharge status: Home / Self Care | Payer: Medicare Other | Source: Ambulatory Visit | Attending: Unknown Physician Specialty | Admitting: Unknown Physician Specialty

## 2014-11-18 DIAGNOSIS — K640 First degree hemorrhoids: Secondary | ICD-10-CM | POA: Insufficient documentation

## 2014-11-18 DIAGNOSIS — J449 Chronic obstructive pulmonary disease, unspecified: Secondary | ICD-10-CM | POA: Diagnosis not present

## 2014-11-18 DIAGNOSIS — Z1211 Encounter for screening for malignant neoplasm of colon: Secondary | ICD-10-CM | POA: Insufficient documentation

## 2014-11-18 DIAGNOSIS — Z888 Allergy status to other drugs, medicaments and biological substances status: Secondary | ICD-10-CM | POA: Diagnosis not present

## 2014-11-18 DIAGNOSIS — Z881 Allergy status to other antibiotic agents status: Secondary | ICD-10-CM | POA: Insufficient documentation

## 2014-11-18 DIAGNOSIS — K529 Noninfective gastroenteritis and colitis, unspecified: Secondary | ICD-10-CM | POA: Diagnosis not present

## 2014-11-18 DIAGNOSIS — K219 Gastro-esophageal reflux disease without esophagitis: Secondary | ICD-10-CM | POA: Diagnosis not present

## 2014-11-18 DIAGNOSIS — D123 Benign neoplasm of transverse colon: Secondary | ICD-10-CM | POA: Insufficient documentation

## 2014-11-18 DIAGNOSIS — Z880 Allergy status to penicillin: Secondary | ICD-10-CM | POA: Diagnosis not present

## 2014-11-18 DIAGNOSIS — R569 Unspecified convulsions: Secondary | ICD-10-CM | POA: Diagnosis not present

## 2014-11-18 DIAGNOSIS — Z885 Allergy status to narcotic agent status: Secondary | ICD-10-CM | POA: Insufficient documentation

## 2014-11-18 DIAGNOSIS — F17210 Nicotine dependence, cigarettes, uncomplicated: Secondary | ICD-10-CM | POA: Diagnosis not present

## 2014-11-18 DIAGNOSIS — G8929 Other chronic pain: Secondary | ICD-10-CM | POA: Diagnosis not present

## 2014-11-18 DIAGNOSIS — J45909 Unspecified asthma, uncomplicated: Secondary | ICD-10-CM | POA: Insufficient documentation

## 2014-11-18 DIAGNOSIS — D128 Benign neoplasm of rectum: Secondary | ICD-10-CM | POA: Insufficient documentation

## 2014-11-18 DIAGNOSIS — D120 Benign neoplasm of cecum: Secondary | ICD-10-CM | POA: Insufficient documentation

## 2014-11-18 DIAGNOSIS — F209 Schizophrenia, unspecified: Secondary | ICD-10-CM | POA: Insufficient documentation

## 2014-11-18 HISTORY — PX: COLONOSCOPY WITH PROPOFOL: SHX5780

## 2014-11-18 SURGERY — COLONOSCOPY WITH PROPOFOL
Anesthesia: General

## 2014-11-18 MED ORDER — PROPOFOL 10 MG/ML IV BOLUS
INTRAVENOUS | Status: DC | PRN
  Administered 2014-11-18: 50 mg via INTRAVENOUS

## 2014-11-18 MED ORDER — PROPOFOL INFUSION 10 MG/ML OPTIME
INTRAVENOUS | Status: DC | PRN
  Administered 2014-11-18: 120 ug/kg/min via INTRAVENOUS

## 2014-11-18 MED ORDER — SODIUM CHLORIDE 0.9 % IV SOLN
INTRAVENOUS | Status: DC
  Administered 2014-11-18 (×3): via INTRAVENOUS

## 2014-11-18 MED ORDER — SODIUM CHLORIDE 0.9 % IV SOLN: INTRAVENOUS | Status: DC

## 2014-11-18 MED ORDER — IPRATROPIUM-ALBUTEROL 0.5-2.5 (3) MG/3ML IN SOLN
3.0000 mL | Freq: Four times a day (QID) | RESPIRATORY_TRACT | Status: DC
  Administered 2014-11-18: 3 mL via RESPIRATORY_TRACT

## 2014-11-18 MED ORDER — PHENYLEPHRINE HCL 10 MG/ML IJ SOLN
INTRAMUSCULAR | Status: DC | PRN
  Administered 2014-11-18: 100 ug via INTRAVENOUS

## 2014-11-18 MED ORDER — LIDOCAINE HCL (CARDIAC) 20 MG/ML IV SOLN
INTRAVENOUS | Status: DC | PRN
  Administered 2014-11-18: 20 mg via INTRAVENOUS

## 2014-11-18 MED ORDER — MIDAZOLAM HCL 2 MG/2ML IJ SOLN
INTRAMUSCULAR | Status: DC | PRN
  Administered 2014-11-18: 1 mg via INTRAVENOUS

## 2014-11-18 MED ORDER — GLYCOPYRROLATE 0.2 MG/ML IJ SOLN
INTRAMUSCULAR | Status: DC | PRN
  Administered 2014-11-18: 0.2 mg via INTRAVENOUS

## 2014-11-18 NOTE — Anesthesia Preprocedure Evaluation (Signed)
Anesthesia Evaluation  Patient identified by MRN, date of birth, ID band Patient awake    Reviewed: Allergy & Precautions, NPO status , Patient's Chart, lab work & pertinent test results  History of Anesthesia Complications Negative for: history of anesthetic complications  Airway Mallampati: III  TM Distance: >3 FB Neck ROM: Full    Dental  (+) Upper Dentures, Edentulous Upper, Edentulous Lower   Pulmonary COPD (albuterol used this am),  COPD inhaler, Current Smoker (1 ppd),           Cardiovascular      Neuro/Psych Seizures - (2ndary to withdrawl from Opana),  Schizophrenia  Neuromuscular disease (intercostal neuralgia)    GI/Hepatic GERD (not on meds now)  ,  Endo/Other    Renal/GU      Musculoskeletal   Abdominal   Peds  Hematology   Anesthesia Other Findings   Reproductive/Obstetrics                             Anesthesia Physical Anesthesia Plan  ASA: III and emergent  Anesthesia Plan: General   Post-op Pain Management:    Induction: Intravenous  Airway Management Planned:   Additional Equipment:   Intra-op Plan:   Post-operative Plan:   Informed Consent: I have reviewed the patients History and Physical, chart, labs and discussed the procedure including the risks, benefits and alternatives for the proposed anesthesia with the patient or authorized representative who has indicated his/her understanding and acceptance.     Plan Discussed with:   Anesthesia Plan Comments:         Anesthesia Quick Evaluation

## 2014-11-18 NOTE — Transfer of Care (Signed)
Immediate Anesthesia Transfer of Care Note  Patient: Brandi Fowler  Procedure(s) Performed: Procedure(s): COLONOSCOPY WITH PROPOFOL (N/A)  Patient Location: PACU  Anesthesia Type:General  Level of Consciousness: awake, alert , oriented and patient cooperative  Airway & Oxygen Therapy: Patient Spontanous Breathing and Patient connected to nasal cannula oxygen  Post-op Assessment: Report given to RN, Post -op Vital signs reviewed and stable and Patient moving all extremities X 4  Post vital signs: Reviewed and stable  Last Vitals:  Filed Vitals:   11/18/14 1542  BP: 130/80  Pulse: 125  Temp: 36.6 C  Resp: 27    Complications: No apparent anesthesia complications

## 2014-11-18 NOTE — Anesthesia Postprocedure Evaluation (Signed)
  Anesthesia Post-op Note  Patient: Brandi Fowler  Procedure(s) Performed: Procedure(s): COLONOSCOPY WITH PROPOFOL (N/A)  Anesthesia type:General  Patient location: PACU  Post pain: Pain level controlled  Post assessment: Post-op Vital signs reviewed, Patient's Cardiovascular Status Stable, Respiratory Function Stable, Patent Airway and No signs of Nausea or vomiting  Post vital signs: Reviewed and stable  Last Vitals:  Filed Vitals:   11/18/14 1610  BP: 115/102  Pulse: 123  Temp:   Resp: 20    Level of consciousness: awake, alert  and patient cooperative  Complications: No apparent anesthesia complications

## 2014-11-18 NOTE — H&P (Signed)
Primary Care Physician:  Valera Castle, MD Primary Gastroenterologist:  Dr. Vira Agar  Pre-Procedure History & Physical:c HPI:  Brandi Fowler is a 49 y.o. female is here for an colonoscopy.   Past Medical History  Diagnosis Date  . Seizures   . Chronic pain   . Schizophrenia   . GERD (gastroesophageal reflux disease)     Past Surgical History  Procedure Laterality Date  . Cholecystectomy    . Spine surgery      Prior to Admission medications   Medication Sig Start Date End Date Taking? Authorizing Provider  citalopram (CELEXA) 20 MG tablet Take 20 mg by mouth daily.   Yes Historical Provider, MD  diazepam (VALIUM) 10 MG tablet Take 10 mg by mouth at bedtime.   Yes Historical Provider, MD  topiramate (TOPAMAX) 200 MG tablet Take 250 mg by mouth 2 (two) times daily.    Yes Historical Provider, MD  levETIRAcetam (KEPPRA) 750 MG tablet Take 750 mg by mouth 2 (two) times daily.    Historical Provider, MD  metoCLOPramide (REGLAN) 10 MG tablet Take 10 mg by mouth 3 (three) times daily.    Historical Provider, MD  omeprazole (PRILOSEC) 20 MG capsule Take 20 mg by mouth 2 (two) times daily.    Historical Provider, MD    Allergies as of 10/14/2014 - Review Complete 09/12/2014  Allergen Reaction Noted  . Abilify [aripiprazole] Swelling 07/30/2014  . Bromfenac  07/30/2014  . Buprenorphine  07/30/2014  . Bupropion  07/30/2014  . Ciprofloxacin  07/30/2014  . Clindamycin/lincomycin  07/30/2014  . Clonazepam  07/30/2014  . Codeine  07/30/2014  . Doxepin  07/30/2014  . Etodolac  07/30/2014  . Fentanyl  07/30/2014  . Flexeril [cyclobenzaprine]  07/30/2014  . Flurbiprofen  07/30/2014  . Gabapentin  07/30/2014  . Hydrocodone  07/30/2014  . Ibuprofen Other (See Comments) 07/30/2014  . Indocin [indomethacin]  07/30/2014  . Ketoprofen  07/30/2014  . Latuda [lurasidone hcl]  07/30/2014  . Lyrica [pregabalin]  07/30/2014  . Meloxicam  07/30/2014  . Methadone  07/30/2014  .  Mirtazapine  07/30/2014  . Nabumetone  07/30/2014  . Naproxen  07/30/2014  . Opana [oxymorphone hcl] Other (See Comments) 07/30/2014  . Oxaprozin  07/30/2014  . Oxycodone  07/30/2014  . Paroxetine  07/30/2014  . Penicillins Swelling 07/30/2014  . Seroquel [quetiapine fumarate]  07/30/2014  . Suboxone [buprenorphine hcl-naloxone hcl] Other (See Comments) 07/30/2014  . Tolmetin  07/30/2014  . Tramadol  07/30/2014  . Tylenol [acetaminophen]  07/30/2014  . Zoloft [sertraline hcl] Nausea And Vomiting 07/30/2014    Family History  Problem Relation Age of Onset  . Alcohol abuse Mother   . Cancer Mother   . COPD Mother   . Hearing loss Mother   . Vision loss Mother   . Alcohol abuse Father   . Depression Father   . Early death Father   . Mental illness Father     Social History   Social History  . Marital Status: Single    Spouse Name: N/A  . Number of Children: N/A  . Years of Education: N/A   Occupational History  . Not on file.   Social History Main Topics  . Smoking status: Current Every Day Smoker -- 0.25 packs/day    Types: Cigarettes  . Smokeless tobacco: Not on file  . Alcohol Use: 0.6 oz/week    1 Standard drinks or equivalent per week  . Drug Use: Not on file  .  Sexual Activity: Not on file   Other Topics Concern  . Not on file   Social History Narrative    Review of Systems: See HPI, otherwise negative ROS  Physical Exam: BP 133/82 mmHg  Pulse 116  Temp(Src) 99 F (37.2 C) (Tympanic)  Resp 19  Ht 5\' 5"  (1.651 m)  Wt 113.399 kg (250 lb)  BMI 41.60 kg/m2  SpO2 96%  LMP  General:   Alert,  pleasant and cooperative in NAD Head:  Normocephalic and atraumatic. Neck:  Supple; no masses or thyromegaly. Lungs:  Clear throughout to auscultation.    Heart:  Regular rate and rhythm. Abdomen:  Soft, nontender and nondistended. Normal bowel sounds, without guarding, and without rebound.   Neurologic:  Alert and  oriented x4;  grossly normal  neurologically.  Impression/Plan: Brandi Fowler is here for an colonoscopy to be performed for screening and chronic diarrhea.  Risks, benefits, limitations, and alternatives regarding  colonoscopy have been reviewed with the patient.  Questions have been answered.  All parties agreeable.   Gaylyn Cheers, MD  11/18/2014, 2:58 PM

## 2014-11-18 NOTE — Op Note (Signed)
[facility] Gastroenterology Patient Name: Brandi Fowler Procedure Date: 11/18/2014 2:56 PM MRN: [id number] Account #: [id number] Date of Birth: [birth year] Admit Type: Outpatient Age: 49 Room: [facility] ENDO ROOM 1 Gender: Female Note Status: Finalized Procedure:         Colonoscopy Indications:       Screening for colorectal malignant neoplasm Providers:         Manya Silvas, MD Referring MD:      Wynona Canes. Kym Groom, MD (Referring MD) Medicines:         Propofol per Anesthesia Complications:     No immediate complications. Procedure:         Pre-Anesthesia Assessment:                    - After reviewing the risks and benefits, the patient was                     deemed in satisfactory condition to undergo the procedure.                    After obtaining informed consent, the colonoscope was                     passed under direct vision. Throughout the procedure, the                     patient's blood pressure, pulse, and oxygen saturations                     were monitored continuously. The Colonoscope was                     introduced through the anus and advanced to the the cecum,                     identified by appendiceal orifice and ileocecal valve. The                     patient tolerated the procedure. The quality of the bowel                     preparation was adequate to identify polyps. Findings:      A small polyp was found in the cecum. The polyp was sessile. The polyp       was removed with a hot snare. Resection and retrieval were complete.      A small polyp was found in the transverse colon. The polyp was sessile.       The polyp was removed with a hot snare. Resection and retrieval were       complete.      A medium polyp was found in the rectum. The polyp was sessile. The polyp       was removed with a hot snare. Resection and retrieval were complete.      Internal hemorrhoids were found during endoscopy. The hemorrhoids were   medium-sized and Grade I (internal hemorrhoids that do not prolapse). Impression:        - One small polyp in the cecum. Resected and retrieved.                    - One small polyp in the transverse colon. Resected and  retrieved.                    - One medium polyp in the rectum. Resected and retrieved.                    - Internal hemorrhoids. Recommendation:    - Await pathology results. Manya Silvas, MD 11/18/2014 3:38:21 PM This report has been signed electronically. Number of Addenda: 0 Note Initiated On: 11/18/2014 2:56 PM Scope Withdrawal Time: 0 hours 21 minutes 30 seconds  Total Procedure Duration: 0 hours 28 minutes 39 seconds       [facility]

## 2014-11-18 NOTE — Anesthesia Procedure Notes (Signed)
Performed by: Elbony Mcclimans Pre-anesthesia Checklist: Patient identified, Emergency Drugs available, Suction available, Patient being monitored and Timeout performed Patient Re-evaluated:Patient Re-evaluated prior to inductionOxygen Delivery Method: Nasal cannula Intubation Type: IV induction       

## 2014-11-19 ENCOUNTER — Encounter: Payer: Self-pay | Admitting: Unknown Physician Specialty

## 2014-11-20 LAB — SURGICAL PATHOLOGY

## 2015-01-11 ENCOUNTER — Encounter: Payer: Self-pay | Admitting: Emergency Medicine

## 2015-01-11 ENCOUNTER — Emergency Department: Payer: Medicare Other

## 2015-01-11 ENCOUNTER — Emergency Department
Admission: EM | Admit: 2015-01-11 | Discharge: 2015-01-11 | Disposition: A | Discharge status: Home / Self Care | Payer: Medicare Other | Attending: Emergency Medicine | Admitting: Emergency Medicine

## 2015-01-11 DIAGNOSIS — Y9289 Other specified places as the place of occurrence of the external cause: Secondary | ICD-10-CM | POA: Insufficient documentation

## 2015-01-11 DIAGNOSIS — Z79891 Long term (current) use of opiate analgesic: Secondary | ICD-10-CM | POA: Insufficient documentation

## 2015-01-11 DIAGNOSIS — S92331A Displaced fracture of third metatarsal bone, right foot, initial encounter for closed fracture: Secondary | ICD-10-CM | POA: Diagnosis not present

## 2015-01-11 DIAGNOSIS — Z880 Allergy status to penicillin: Secondary | ICD-10-CM | POA: Insufficient documentation

## 2015-01-11 DIAGNOSIS — S92301A Fracture of unspecified metatarsal bone(s), right foot, initial encounter for closed fracture: Secondary | ICD-10-CM

## 2015-01-11 DIAGNOSIS — S92351A Displaced fracture of fifth metatarsal bone, right foot, initial encounter for closed fracture: Secondary | ICD-10-CM | POA: Insufficient documentation

## 2015-01-11 DIAGNOSIS — Y9301 Activity, walking, marching and hiking: Secondary | ICD-10-CM | POA: Diagnosis not present

## 2015-01-11 DIAGNOSIS — W010XXA Fall on same level from slipping, tripping and stumbling without subsequent striking against object, initial encounter: Secondary | ICD-10-CM | POA: Diagnosis not present

## 2015-01-11 DIAGNOSIS — S92341A Displaced fracture of fourth metatarsal bone, right foot, initial encounter for closed fracture: Secondary | ICD-10-CM | POA: Diagnosis not present

## 2015-01-11 DIAGNOSIS — Z720 Tobacco use: Secondary | ICD-10-CM | POA: Insufficient documentation

## 2015-01-11 DIAGNOSIS — S99911A Unspecified injury of right ankle, initial encounter: Secondary | ICD-10-CM | POA: Diagnosis present

## 2015-01-11 DIAGNOSIS — Z79899 Other long term (current) drug therapy: Secondary | ICD-10-CM | POA: Diagnosis not present

## 2015-01-11 DIAGNOSIS — Y998 Other external cause status: Secondary | ICD-10-CM | POA: Diagnosis not present

## 2015-01-11 DIAGNOSIS — S92321A Displaced fracture of second metatarsal bone, right foot, initial encounter for closed fracture: Secondary | ICD-10-CM | POA: Diagnosis not present

## 2015-01-11 HISTORY — DX: Chronic obstructive pulmonary disease, unspecified: J44.9

## 2015-01-11 HISTORY — DX: Noninfective gastroenteritis and colitis, unspecified: K52.9

## 2015-01-11 HISTORY — DX: Other cervical disc degeneration, unspecified cervical region: M50.30

## 2015-01-11 MED ORDER — MORPHINE SULFATE ER 15 MG PO TBCR: 15.0000 mg | EXTENDED_RELEASE_TABLET | Freq: Two times a day (BID) | ORAL | Status: DC

## 2015-01-11 MED ORDER — OXYCODONE HCL ER 10 MG PO T12A
10.0000 mg | EXTENDED_RELEASE_TABLET | Freq: Two times a day (BID) | ORAL | Status: DC
  Administered 2015-01-11: 10 mg via ORAL

## 2015-01-11 MED ORDER — OXYCODONE HCL ER 10 MG PO T12A
EXTENDED_RELEASE_TABLET | ORAL | Status: AC
  Administered 2015-01-11: 10 mg via ORAL
  Filled 2015-01-11: qty 1

## 2015-01-11 MED ORDER — MORPHINE SULFATE (PF) 4 MG/ML IV SOLN
4.0000 mg | Freq: Once | INTRAVENOUS | Status: AC
  Administered 2015-01-11: 4 mg via INTRAMUSCULAR
  Filled 2015-01-11: qty 1

## 2015-01-11 NOTE — ED Notes (Signed)
Per verbal order from Dr Cinda Quest, pt given 1--10 mg oxycontin due to severe pain from multiple fracture in right foot. Dr Cinda Quest ordered this medication due to pt not being able to fill prescription for pain medication that was ordered with discharge

## 2015-01-11 NOTE — ED Notes (Addendum)
Pt here from home via [facility] with c/o right ankle pain and right top of foot pain. Pt with redness and swelling noted to both areas. Pt reports she tripped and fell tonight.

## 2015-01-11 NOTE — ED Provider Notes (Signed)
[facility] Emergency Department Provider Note ____________________________________________  Time seen: Approximately 6:42 PM  I have reviewed the triage vital signs and the nursing notes.   HISTORY  Chief Complaint Ankle Pain  HPI Brandi Fowler is a 49 y.o. female who presents to the emergency department for evaluationof right foot and ankle pain. She states that she was walking her dog when his leash got wrapped around her foot and caused her to trip and fall. She complains of severe pain in the top of her right foot and ankle.   Past Medical History  Diagnosis Date  . Seizures ([place])   . Chronic pain   . Schizophrenia ([place])   . GERD (gastroesophageal reflux disease)   . COPD (chronic obstructive pulmonary disease) ([place])   . DDD (degenerative disc disease), cervical   . Chronic diarrhea     Patient Active Problem List   Diagnosis Date Noted  . 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 DDD (degenerative disc disease), lumbar 07/30/2014  . Thoracic facet syndrome 07/30/2014  . Facet syndrome, lumbar 07/30/2014  . 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 Cholecystectomy    . Spine surgery    . Colonoscopy with propofol N/A 11/18/2014    Procedure: COLONOSCOPY WITH PROPOFOL;  Surgeon: Manya Silvas, MD;  Location: [facility] ENDOSCOPY;  Service: Endoscopy;  Laterality: N/A;    Current Outpatient Rx  Name  Route  Sig  Dispense  Refill  . citalopram (CELEXA) 20 MG tablet   Oral   Take 20 mg by mouth daily.         . diazepam (VALIUM) 10 MG tablet   Oral   Take 10 mg by mouth at bedtime.         . levETIRAcetam (KEPPRA) 750 MG tablet   Oral   Take 750 mg by mouth 2 (two) times daily.         . metoCLOPramide (REGLAN) 10 MG tablet   Oral   Take 10 mg by mouth 3 (three) times daily.         Marland Kitchen morphine (MS CONTIN) 15 MG 12 hr tablet   Oral   Take 1 tablet (15 mg total) by  mouth every 12 (twelve) hours.   12 tablet   0   . omeprazole (PRILOSEC) 20 MG capsule   Oral   Take 20 mg by mouth 2 (two) times daily.         Marland Kitchen topiramate (TOPAMAX) 200 MG tablet   Oral   Take 250 mg by mouth 2 (two) times daily.            Allergies Abilify; Bromfenac; Buprenorphine; Bupropion; Ciprofloxacin; Clindamycin/lincomycin; Clonazepam; Codeine; Colestipol; Doxepin; Etodolac; Fentanyl; Flexeril; Flurbiprofen; Gabapentin; Hydrocodone; Ibuprofen; Indocin; Ketoprofen; Latuda; Lyrica; Meloxicam; Methadone; Metronidazole; Mirtazapine; Nabumetone; Naproxen; Opana; Oxaprozin; Oxycodone; Paroxetine; Penicillins; Seroquel; Suboxone; Tolmetin; Tramadol; Tylenol; and Zoloft  Family History  Problem Relation Age of Onset  . Alcohol abuse Mother   . Cancer Mother   . COPD Mother   . Hearing loss Mother   . Vision loss Mother   . Alcohol abuse Father   . Depression Father   . Early death Father   . Mental illness Father     Social History Social History  Substance Use Topics  . Smoking status: Current Every Day Smoker -- 0.25 packs/day    Types: Cigarettes  . Smokeless tobacco: None  . Alcohol Use: 0.6 oz/week  1 Standard drinks or equivalent per week    Review of Systems Constitutional: No recent illness. Eyes: No visual changes. ENT: No sore throat. Cardiovascular: Denies chest pain or palpitations. Respiratory: Denies shortness of breath. Gastrointestinal: No abdominal pain.  Genitourinary: Negative for dysuria. Musculoskeletal: Pain in right foot and ankle Skin: Negative for rash. Neurological: Negative for headaches, focal weakness or numbness. 10-point ROS otherwise negative.  ____________________________________________   PHYSICAL EXAM:  VITAL SIGNS: ED Triage Vitals  Enc Vitals Group     BP --      Pulse Rate 01/11/15 1814 107     Resp 01/11/15 1814 24     Temp 01/11/15 1814 97.9 F (36.6 C)     Temp Source 01/11/15 1814 Oral     SpO2  01/11/15 1814 97 %     Weight 01/11/15 1814 242 lb (109.77 kg)     Height 01/11/15 1814 5\' 5"  (1.651 m)     Head Cir --      Peak Flow --      Pain Score 01/11/15 1814 10     Pain Loc --      Pain Edu? --      Excl. in [place]? --     Constitutional: Alert and oriented. Well appearing and in no acute distress. Eyes: Conjunctivae are normal. EOMI. Head: Atraumatic. Nose: No congestion/rhinnorhea. Neck: No stridor.  Respiratory: Normal respiratory effort.   Musculoskeletal: Tenderness in the midfoot and over the lateral malleolus of the right foot and ankle. Neurologic:  Normal speech and language. No gross focal neurologic deficits are appreciated. Speech is normal. No gait instability. Skin:  Skin is warm, dry and intact. Atraumatic. Psychiatric: Mood and affect are normal. Speech and behavior are normal.  ____________________________________________   LABS (all labs ordered are listed, but only abnormal results are displayed)  Labs Reviewed - No data to display ____________________________________________  RADIOLOGY  Proximal metatarsal fractures crossing the 2nd-5th with fracture of the 4th metatarsal head.  ____________________________________________   PROCEDURES  Procedure(s) performed:  SPLINT APPLICATION Date/Time: 54:27 AM Authorized by: Sherrie George Consent: Verbal consent obtained. Risks and benefits: risks, benefits and alternatives were discussed Consent given by: patient Splint applied by: ER Tech Location details: right foot Splint type: Posterior toes to mid calf Supplies used: OCL and ACE Post-procedure: The splinted body part was neurovascularly unchanged following the procedure. Patient tolerance: Patient tolerated the procedure well with no immediate complications.      ____________________________________________   INITIAL IMPRESSION / ASSESSMENT AND PLAN / ED COURSE  Pertinent labs & imaging results that were available during my care of  the patient were reviewed by me and considered in my medical decision making (see chart for details).  Case discussed with Dr. Daine Gip who viewed the films as well. Patient is to follow up in the clinic. She will remain non weight bearing. She was given MS Contin for pain as that is one of the few she is not allergic to.  ____________________________________________   FINAL CLINICAL IMPRESSION(S) / ED DIAGNOSES  Final diagnoses:  Multiple closed fractures of metatarsal bone, right, initial encounter       Victorino Dike, FNP 01/12/15 1019  Daymon Larsen, MD 01/15/15 [phone]

## 2015-01-11 NOTE — Discharge Instructions (Signed)
Metatarsal Fracture A metatarsal fracture is a break in a metatarsal bone. Metatarsal bones connect your toe bones to your ankle bones. CAUSES This type of fracture may be caused by:  A sudden twisting of your foot.  A fall onto your foot.  Overuse or repetitive exercise. RISK FACTORS This condition is more likely to develop in people who:  Play contact sports.  Have a bone disease.  Have a low calcium level. SYMPTOMS Symptoms of this condition include:  Pain that is worse when walking or standing.  Pain when pressing on the foot or moving the toes.  Swelling.  Bruising on the top or bottom of the foot.  A foot that appears shorter than the other one. DIAGNOSIS This condition is diagnosed with a physical exam. You may also have imaging tests, such as:  X-rays.  A CT scan.  MRI. TREATMENT Treatment for this condition depends on its severity and whether a bone has moved out of place. Treatment may involve:  Rest.  Wearing foot support such as a cast, splint, or boot for several weeks.  Using crutches.  Surgery to move bones back into the right position. Surgery is usually needed if there are many pieces of broken bone or bones that are very out of place (displaced fracture).  Physical therapy. This may be needed to help you regain full movement and strength in your foot. You will need to return to your health care provider to have X-rays taken until your bones heal. Your health care provider will look at the X-rays to make sure that your foot is healing well. HOME CARE INSTRUCTIONS  If You Have a Cast:  Do not stick anything inside the cast to scratch your skin. Doing that increases your risk of infection.  Check the skin around the cast every day. Report any concerns to your health care provider. You may put lotion on dry skin around the edges of the cast. Do not apply lotion to the skin underneath the cast.  Keep the cast clean and dry. If You Have a Splint  or a Supportive Boot:  Wear it as directed by your health care provider. Remove it only as directed by your health care provider.  Loosen it if your toes become numb and tingle, or if they turn cold and blue.  Keep it clean and dry. Bathing  Do not take baths, swim, or use a hot tub until your health care provider approves. Ask your health care provider if you can take showers. You may only be allowed to take sponge baths for bathing.  If your health care provider approves bathing and showering, cover the cast or splint with a watertight plastic bag to protect it from water. Do not let the cast or splint get wet. Managing Pain, Stiffness, and Swelling  If directed, apply ice to the injured area (if you have a splint, not a cast).  Put ice in a plastic bag.  Place a towel between your skin and the bag.  Leave the ice on for 20 minutes, 2-3 times per day.  Move your toes often to avoid stiffness and to lessen swelling.  Raise (elevate) the injured area above the level of your heart while you are sitting or lying down. Driving  Do not drive or operate heavy machinery while taking pain medicine.  Do not drive while wearing foot support on a foot that you use for driving. Activity  Return to your normal activities as directed by your health care  provider. Ask your health care provider what activities are safe for you.  Perform exercises as directed by your health care provider or physical therapist. Safety  Do not use the injured foot to support your body weight until your health care provider says that you can. Use crutches as directed by your health care provider. General Instructions  Do not put pressure on any part of the cast or splint until it is fully hardened. This may take several hours.  Do not use any tobacco products, including cigarettes, chewing tobacco, or e-cigarettes. Tobacco can delay bone healing. If you need help quitting, ask your health care  provider.  Take medicines only as directed by your health care provider.  Keep all follow-up visits as directed by your health care provider. This is important. SEEK MEDICAL CARE IF:  You have a fever.  Your cast, splint, or boot is too loose or too tight.  Your cast, splint, or boot is damaged.  Your pain medicine is not helping.  You have pain, tingling, or numbness in your foot that is not going away. SEEK IMMEDIATE MEDICAL CARE IF:  You have severe pain.  You have tingling or numbness in your foot that is getting worse.  Your foot feels cold or becomes numb.  Your foot changes color.   This information is not intended to replace advice given to you by your health care provider. Make sure you discuss any questions you have with your health care provider.   Document Released: 11/14/2001 Document Revised: 07/09/2014 Document Reviewed: 12/19/2013 Elsevier Interactive Patient Education 2016 [place] or Splint Care Casts and splints support injured limbs and keep bones from moving while they heal. It is important to care for your cast or splint at home.  HOME CARE INSTRUCTIONS  Keep the cast or splint uncovered during the drying period. It can take 24 to 48 hours to dry if it is made of plaster. A fiberglass cast will dry in less than 1 hour.  Do not rest the cast on anything harder than a pillow for the first 24 hours.  Do not put weight on your injured limb or apply pressure to the cast until your health care provider gives you permission.  Keep the cast or splint dry. Wet casts or splints can lose their shape and may not support the limb as well. A wet cast that has lost its shape can also create harmful pressure on your skin when it dries. Also, wet skin can become infected.  Cover the cast or splint with a plastic bag when bathing or when out in the rain or snow. If the cast is on the trunk of the body, take sponge baths until the cast is removed.  If your  cast does become wet, dry it with a towel or a blow dryer on the cool setting only.  Keep your cast or splint clean. Soiled casts may be wiped with a moistened cloth.  Do not place any hard or soft foreign objects under your cast or splint, such as cotton, toilet paper, lotion, or powder.  Do not try to scratch the skin under the cast with any object. The object could get stuck inside the cast. Also, scratching could lead to an infection. If itching is a problem, use a blow dryer on a cool setting to relieve discomfort.  Do not trim or cut your cast or remove padding from inside of it.  Exercise all joints next to the injury that are  not immobilized by the cast or splint. For example, if you have a long leg cast, exercise the hip joint and toes. If you have an arm cast or splint, exercise the shoulder, elbow, thumb, and fingers.  Elevate your injured arm or leg on 1 or 2 pillows for the first 1 to 3 days to decrease swelling and pain.It is best if you can comfortably elevate your cast so it is higher than your heart. SEEK MEDICAL CARE IF:   Your cast or splint cracks.  Your cast or splint is too tight or too loose.  You have unbearable itching inside the cast.  Your cast becomes wet or develops a soft spot or area.  You have a bad smell coming from inside your cast.  You get an object stuck under your cast.  Your skin around the cast becomes red or raw.  You have new pain or worsening pain after the cast has been applied. SEEK IMMEDIATE MEDICAL CARE IF:   You have fluid leaking through the cast.  You are unable to move your fingers or toes.  You have discolored (blue or white), cool, painful, or very swollen fingers or toes beyond the cast.  You have tingling or numbness around the injured area.  You have severe pain or pressure under the cast.  You have any difficulty with your breathing or have shortness of breath.  You have chest pain.   This information is not  intended to replace advice given to you by your health care provider. Make sure you discuss any questions you have with your health care provider.   Document Released: 02/20/2000 Document Revised: 12/13/2012 Document Reviewed: 08/31/2012 Elsevier Interactive Patient Education [employer].

## 2015-12-15 ENCOUNTER — Other Ambulatory Visit: Payer: Self-pay | Admitting: Pain Medicine

## 2016-03-02 DIAGNOSIS — F17200 Nicotine dependence, unspecified, uncomplicated: Secondary | ICD-10-CM | POA: Insufficient documentation

## 2016-04-26 ENCOUNTER — Ambulatory Visit (INDEPENDENT_AMBULATORY_CARE_PROVIDER_SITE_OTHER): Payer: Medicare Other | Admitting: Licensed Clinical Social Worker

## 2016-04-26 DIAGNOSIS — F200 Paranoid schizophrenia: Secondary | ICD-10-CM | POA: Diagnosis not present
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
## 2016-05-18 ENCOUNTER — Ambulatory Visit: Payer: Medicare Other | Admitting: Licensed Clinical Social Worker

## 2016-06-01 ENCOUNTER — Ambulatory Visit (INDEPENDENT_AMBULATORY_CARE_PROVIDER_SITE_OTHER): Payer: Medicare Other | Admitting: Licensed Clinical Social Worker

## 2016-06-01 ENCOUNTER — Other Ambulatory Visit
Admission: RE | Admit: 2016-06-01 | Discharge: 2016-06-01 | Disposition: A | Discharge status: Home / Self Care | Payer: Medicare Other | Source: Ambulatory Visit | Attending: Nurse Practitioner | Admitting: Nurse Practitioner

## 2016-06-01 DIAGNOSIS — F200 Paranoid schizophrenia: Secondary | ICD-10-CM

## 2016-06-01 DIAGNOSIS — K529 Noninfective gastroenteritis and colitis, unspecified: Secondary | ICD-10-CM | POA: Insufficient documentation

## 2016-06-01 LAB — GASTROINTESTINAL PANEL BY PCR, STOOL (REPLACES STOOL CULTURE)

## 2016-06-01 LAB — C DIFFICILE QUICK SCREEN W PCR REFLEX
C Diff antigen: NEGATIVE
C Diff interpretation: NOT DETECTED
C Diff toxin: NEGATIVE

## 2016-06-03 NOTE — Progress Notes (Signed)
   THERAPIST PROGRESS NOTE  Session Time: 44min  Participation Level: Active  Behavioral Response: DisheveledAlertEuthymic  Type of Therapy: Individual Therapy  Treatment Goals addressed: Coping  Interventions: Supportive  Summary: Brandi Fowler is a 51 y.o. female who presents with continued symptoms of her diagnosis.  LCSW discussed what psychotherapy is and is not and the importance of the therapeutic relationship to include open and honest communication between client and therapist and building trust.  Reviewed advantages and disadvantages of the therapeutic process and limitations to the therapeutic relationship including LCSW's role in maintaining the safety of the client, others and those in client's care.    Suicidal/Homicidal: No  Therapist Response: LCSW provided Patient with  emotional support and encouragement.  Normalized her feelings.  Commended Patient on her progress and reinforced the importance of client staying focused on her own strengths and resources and resiliency. Processed various strategies for dealing with stressors.    Plan: Return again in 2 weeks.  Diagnosis: Axis I: Paranoid Schizophrenia    Axis II: No diagnosis    Lubertha South, LCSW 06/03/2016

## 2016-06-14 ENCOUNTER — Ambulatory Visit: Payer: Medicare Other | Admitting: Psychiatry

## 2016-06-29 ENCOUNTER — Other Ambulatory Visit: Payer: Self-pay | Admitting: Family

## 2016-06-29 ENCOUNTER — Ambulatory Visit
Admission: RE | Admit: 2016-06-29 | Discharge: 2016-06-29 | Disposition: A | Discharge status: Home / Self Care | Payer: Medicare Other | Source: Ambulatory Visit | Attending: Family | Admitting: Family

## 2016-06-29 DIAGNOSIS — J441 Chronic obstructive pulmonary disease with (acute) exacerbation: Secondary | ICD-10-CM | POA: Insufficient documentation

## 2016-07-20 ENCOUNTER — Other Ambulatory Visit: Payer: Self-pay | Admitting: *Deleted

## 2016-07-20 ENCOUNTER — Ambulatory Visit
Admission: RE | Admit: 2016-07-20 | Discharge: 2016-07-20 | Disposition: A | Discharge status: Home / Self Care | Payer: Medicare Other | Source: Ambulatory Visit | Attending: Specialist | Admitting: Specialist

## 2016-07-20 ENCOUNTER — Ambulatory Visit
Admission: RE | Admit: 2016-07-20 | Discharge: 2016-07-20 | Disposition: A | Discharge status: Home / Self Care | Payer: Medicare Other | Source: Ambulatory Visit | Attending: *Deleted | Admitting: *Deleted

## 2016-07-20 DIAGNOSIS — M546 Pain in thoracic spine: Secondary | ICD-10-CM | POA: Insufficient documentation

## 2016-07-26 ENCOUNTER — Encounter (HOSPITAL_COMMUNITY): Payer: Self-pay | Admitting: Psychiatry

## 2016-07-26 ENCOUNTER — Ambulatory Visit (INDEPENDENT_AMBULATORY_CARE_PROVIDER_SITE_OTHER): Payer: Medicare Other | Admitting: Psychiatry

## 2016-07-26 VITALS — BP 128/74 | HR 90 | Ht 65.5 in | Wt 266.4 lb

## 2016-07-26 DIAGNOSIS — Z811 Family history of alcohol abuse and dependence: Secondary | ICD-10-CM | POA: Diagnosis not present

## 2016-07-26 DIAGNOSIS — F17210 Nicotine dependence, cigarettes, uncomplicated: Secondary | ICD-10-CM | POA: Diagnosis not present

## 2016-07-26 DIAGNOSIS — Z818 Family history of other mental and behavioral disorders: Secondary | ICD-10-CM | POA: Diagnosis not present

## 2016-07-26 DIAGNOSIS — F200 Paranoid schizophrenia: Secondary | ICD-10-CM | POA: Diagnosis not present

## 2016-07-26 MED ORDER — DIVALPROEX SODIUM 250 MG PO DR TAB: 250.0000 mg | DELAYED_RELEASE_TABLET | Freq: Two times a day (BID) | ORAL | 0 refills | Status: DC

## 2016-07-26 MED ORDER — HALOPERIDOL 1 MG PO TABS: 1.0000 mg | ORAL_TABLET | Freq: Every day | ORAL | 0 refills | Status: DC
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
## 2016-08-06 ENCOUNTER — Encounter: Payer: Self-pay | Admitting: *Deleted

## 2016-08-09 ENCOUNTER — Ambulatory Visit
Admission: RE | Admit: 2016-08-09 | Payer: Medicare Other | Source: Ambulatory Visit | Admitting: Unknown Physician Specialty

## 2016-08-09 ENCOUNTER — Encounter: Admission: RE | Payer: Self-pay | Source: Ambulatory Visit

## 2016-08-09 HISTORY — DX: Major depressive disorder, single episode, unspecified: F32.9

## 2016-08-09 HISTORY — DX: Calculus of kidney: N20.0

## 2016-08-09 HISTORY — DX: Obesity, class 3: E66.813

## 2016-08-09 HISTORY — DX: Other chronic pain: G89.29

## 2016-08-09 HISTORY — DX: Myalgia, other site: M79.18

## 2016-08-09 HISTORY — DX: Headache, unspecified: R51.9

## 2016-08-09 HISTORY — DX: Chronic kidney disease, unspecified: N18.9

## 2016-08-09 HISTORY — DX: Headache: R51

## 2016-08-09 HISTORY — DX: Hemorrhage of anus and rectum: K62.5

## 2016-08-09 HISTORY — DX: Depression, unspecified: F32.A

## 2016-08-09 HISTORY — DX: Morbid (severe) obesity due to excess calories: E66.01

## 2016-08-09 SURGERY — COLONOSCOPY WITH PROPOFOL
Anesthesia: General

## 2016-08-17 ENCOUNTER — Ambulatory Visit (HOSPITAL_COMMUNITY): Payer: Self-pay | Admitting: Psychiatry

## 2016-11-18 ENCOUNTER — Encounter: Payer: Self-pay | Admitting: *Deleted

## 2016-11-22 ENCOUNTER — Encounter: Admission: RE | Payer: Self-pay | Source: Ambulatory Visit

## 2016-11-22 ENCOUNTER — Ambulatory Visit
Admission: RE | Admit: 2016-11-22 | Payer: Medicare Other | Source: Ambulatory Visit | Admitting: Unknown Physician Specialty

## 2016-11-22 SURGERY — COLONOSCOPY WITH PROPOFOL
Anesthesia: General

## 2017-08-11 ENCOUNTER — Other Ambulatory Visit: Payer: Self-pay | Admitting: Physician Assistant

## 2017-08-11 DIAGNOSIS — M546 Pain in thoracic spine: Secondary | ICD-10-CM

## 2018-02-09 ENCOUNTER — Encounter (INDEPENDENT_AMBULATORY_CARE_PROVIDER_SITE_OTHER): Payer: Self-pay | Admitting: Vascular Surgery

## 2018-02-09 ENCOUNTER — Ambulatory Visit (INDEPENDENT_AMBULATORY_CARE_PROVIDER_SITE_OTHER): Payer: Medicare HMO | Admitting: Vascular Surgery

## 2018-02-09 VITALS — BP 126/81 | HR 94 | Resp 22 | Ht 65.5 in | Wt 294.0 lb

## 2018-02-09 DIAGNOSIS — M79604 Pain in right leg: Secondary | ICD-10-CM | POA: Diagnosis not present

## 2018-02-09 DIAGNOSIS — M5136 Other intervertebral disc degeneration, lumbar region: Secondary | ICD-10-CM

## 2018-02-09 DIAGNOSIS — J449 Chronic obstructive pulmonary disease, unspecified: Secondary | ICD-10-CM

## 2018-02-09 DIAGNOSIS — J441 Chronic obstructive pulmonary disease with (acute) exacerbation: Secondary | ICD-10-CM | POA: Insufficient documentation

## 2018-02-09 DIAGNOSIS — M79605 Pain in left leg: Secondary | ICD-10-CM | POA: Diagnosis not present

## 2018-02-09 DIAGNOSIS — M79606 Pain in leg, unspecified: Secondary | ICD-10-CM | POA: Insufficient documentation

## 2018-02-09 NOTE — Progress Notes (Signed)
MRN : [id number]  Brandi Fowler is a 52 y.o. (1966/03/02) female who presents with chief complaint of leg pain.  History of Present Illness:   The patient is seen for evaluation of painful lower extremities. Patient notes the pain is variable and not always associated with activity.  The pain is somewhat consistent day to day occurring on most days. The patient notes the pain also occurs with standing and routinely seems worse as the day wears on. The pain has been progressive over the past several years. The patient states these symptoms are causing  a profound negative impact on quality of life and daily activities.  The patient denies rest pain or dangling of an extremity off the side of the bed during the night for relief. No open wounds or sores at this time. No history of DVT or phlebitis. No prior interventions or surgeries.  There is a  history of back problems and DJD of the lumbar and sacral spine.    No outpatient medications have been marked as taking for the 02/09/18 encounter (Appointment) with Delana Meyer, Dolores Lory, MD.    Past Medical History:  Diagnosis Date  . BRBPR (bright red blood per rectum)   . Chronic diarrhea   . Chronic kidney disease   . Chronic myofascial pain   . Chronic pain   . COPD (chronic obstructive pulmonary disease) ([place])   . DDD (degenerative disc disease), cervical   . DDD (degenerative disc disease), cervical   . Depression   . GERD (gastroesophageal reflux disease)   . Headache    migraines  . Kidney stones   . Obesity, Class III, BMI 40-49.9 (morbid obesity) ([place])   . Schizophrenia ([place])   . Seizures ([place])     Past Surgical History:  Procedure Laterality Date  . CHOLECYSTECTOMY    . COLONOSCOPY WITH PROPOFOL N/A 11/18/2014   Procedure: COLONOSCOPY WITH PROPOFOL;  Surgeon: Manya Silvas, MD;  Location: [facility] ENDOSCOPY;  Service: Endoscopy;  Laterality: N/A;  . ESOPHAGOGASTRODUODENOSCOPY    . KNEE SURGERY Left   . polpectomy  N/A   . SPINE SURGERY    . TUBAL LIGATION      Social History Social History   Tobacco Use  . Smoking status: Current Every Day Smoker    Packs/day: 0.25    Types: Cigarettes  . Smokeless tobacco: Former Network engineer Use Topics  . Alcohol use: Yes    Alcohol/week: 1.0 standard drinks    Types: 1 Standard drinks or equivalent per week  . Drug use: No    Family History Family History  Problem Relation Age of Onset  . Alcohol abuse Mother   . Cancer Mother   . COPD Mother   . Hearing loss Mother   . Vision loss Mother   . Alcohol abuse Father   . Depression Father   . Early death Father   . Mental illness Father   No family history of bleeding/clotting disorders, porphyria or autoimmune disease   Allergies  Allergen Reactions  . Abilify [Aripiprazole] Swelling  . Bromfenac   . Buprenorphine   . Buprenorphine-Naloxone Other (See Comments)  . Bupropion   . Ciprofloxacin   . Ciprofloxacin Hcl Other (See Comments)  . Clindamycin/Lincomycin   . Clonazepam   . Codeine   . Colestipol   . Doxepin   . Etodolac   . Fentanyl   . Flexeril [Cyclobenzaprine]   . Flurbiprofen   . Gabapentin   . Hydrocodone   .  Ibuprofen Other (See Comments)    Bleeding in stomach  . Indocin [Indomethacin]   . Ketoprofen   . Latuda [Lurasidone Hcl]   . Lurasidone Swelling  . Lyrica [Pregabalin]   . Meloxicam   . Methadone   . Metronidazole   . Mirtazapine   . Nabumetone   . Naproxen   . Opana [Oxymorphone Hcl] Other (See Comments)    seizures  . Oxaprozin   . Oxycodone   . Oxymorphone Other (See Comments)  . Paroxetine   . Penicillins Swelling  . Quetiapine Other (See Comments)  . Seroquel [Quetiapine Fumarate]   . Sertraline Diarrhea  . Suboxone [Buprenorphine Hcl-Naloxone Hcl] Other (See Comments)    seizures  . Tolmetin   . Tramadol   . Trazodone Itching  . Tylenol [Acetaminophen]   . Zoloft [Sertraline Hcl] Nausea And Vomiting  . Clindamycin Rash     REVIEW  OF SYSTEMS (Negative unless checked)  Constitutional: [] Weight loss  [] Fever  [] Chills Cardiac: [] Chest pain   [] Chest pressure   [] Palpitations   [] Shortness of breath when laying flat   [] Shortness of breath with exertion. Vascular:  [x] Pain in legs with walking   [x] Pain in legs at rest  [] History of DVT   [] Phlebitis   [] Swelling in legs   [] Varicose veins   [] Non-healing ulcers Pulmonary:   [] Uses home oxygen   [] Productive cough   [] Hemoptysis   [] Wheeze  [] COPD   [] Asthma Neurologic:  [] Dizziness   [] Seizures   [] History of stroke   [] History of TIA  [] Aphasia   [] Vissual changes   [] Weakness or numbness in arm   [x] Weakness or numbness in leg Musculoskeletal:   [] Joint swelling   [x] Joint pain   [x] Low back pain Hematologic:  [] Easy bruising  [] Easy bleeding   [] Hypercoagulable state   [] Anemic Gastrointestinal:  [] Diarrhea   [] Vomiting  [] Gastroesophageal reflux/heartburn   [] Difficulty swallowing. Genitourinary:  [] Chronic kidney disease   [] Difficult urination  [] Frequent urination   [] Blood in urine Skin:  [] Rashes   [] Ulcers  Psychological:  [] History of anxiety   []  History of major depression.  Physical Examination  There were no vitals filed for this visit. There is no height or weight on file to calculate BMI. Gen: WD/WN, NAD Head: [place]/AT, No temporalis wasting.  Ear/Nose/Throat: Hearing grossly intact, nares w/o erythema or drainage, poor dentition Eyes: PER, EOMI, sclera nonicteric.  Neck: Supple, no masses.  No bruit or JVD.  Pulmonary:  Good air movement, clear to auscultation bilaterally, no use of accessory muscles.  Cardiac: RRR, normal S1, S2, no Murmurs. Vascular: scattered varicosities present bilaterally.  Mild venous stasis changes to the right leg anf severe venous changes to the left leg.  2-3+ soft pitting edema left greater than right Vessel Right Left  Radial Palpable Palpable  PT Not Palpable Not Palpable  DP Trace Palpable Trace Palpable    Gastrointestinal: soft, non-distended. No guarding/no peritoneal signs.  Musculoskeletal: M/S 5/5 throughout.  No deformity or atrophy.  Neurologic: CN 2-12 intact. Pain and light touch impaired both lower extremities.  Symmetrical.  Speech is fluent. Motor exam as listed above. Psychiatric: Judgment intact, Mood & affect appropriate for pt's clinical situation. Dermatologic: venous rashes no ulcers noted.  No changes consistent with cellulitis. Lymph : No Cervical lymphadenopathy, no lichenification or skin changes of chronic lymphedema.  CBC Lab Results  Component Value Date   WBC 8.9 01/25/2012   HGB 13.2 01/25/2012   HCT 37.8 01/25/2012   MCV 95  01/25/2012   PLT 259 01/25/2012    BMET    Component Value Date/Time   NA 140 01/25/2012 1944   K 3.7 01/25/2012 1944   CL 108 (H) 01/25/2012 1944   CO2 22 01/25/2012 1944   GLUCOSE 101 (H) 01/25/2012 1944   BUN 14 01/25/2012 1944   CREATININE 0.58 (L) 03/24/2012 0719   CALCIUM 9.2 01/25/2012 1944   GFRNONAA >60 03/24/2012 0719   GFRAA >60 03/24/2012 0719   CrCl cannot be calculated (Patient's most recent lab result is older than the maximum 21 days allowed.).  COAG No results found for: INR, PROTIME  Radiology No results found.  Assessment/Plan 1. Pain in both lower extremities  Recommend:  The patient has atypical pain symptoms for pure atherosclerotic disease. However, on physical exam there is evidence of mixed venous and arterial disease, given the diminished pulses and the edema associated with venous changes of the legs.  Noninvasive studies including ABI's and venous ultrasound of the legs will be obtained and the patient will follow up with me to review these studies.  The patient should continue walking and begin a more formal exercise program. The patient should continue his antiplatelet therapy and aggressive treatment of the lipid abnormalities.  The patient should begin wearing graduated compression  socks 15-20 mmHg strength to control edema.  - VAS [place] ABI WITH/WO TBI; Future - VAS [place] LOWER EXTREMITY VENOUS REFLUX; Future  2. DDD (degenerative disc disease), lumbar Continue NSAID medications as already ordered, these medications have been reviewed and there are no changes at this time.  Continued activity and therapy was stressed.   3. Chronic obstructive pulmonary disease, unspecified COPD type ([place]) Continue pulmonary medications and aerosols as already ordered, these medications have been reviewed and there are no changes at this time.     Hortencia Pilar, MD  02/09/2018 1:06 PM

## 2018-02-11 ENCOUNTER — Encounter (INDEPENDENT_AMBULATORY_CARE_PROVIDER_SITE_OTHER): Payer: Self-pay | Admitting: Vascular Surgery

## 2018-05-18 ENCOUNTER — Encounter (INDEPENDENT_AMBULATORY_CARE_PROVIDER_SITE_OTHER): Payer: Medicare HMO

## 2018-05-18 ENCOUNTER — Ambulatory Visit (INDEPENDENT_AMBULATORY_CARE_PROVIDER_SITE_OTHER): Payer: Medicare HMO | Admitting: Nurse Practitioner

## 2018-06-15 ENCOUNTER — Ambulatory Visit (INDEPENDENT_AMBULATORY_CARE_PROVIDER_SITE_OTHER): Payer: Medicare HMO

## 2018-06-15 ENCOUNTER — Encounter (INDEPENDENT_AMBULATORY_CARE_PROVIDER_SITE_OTHER): Payer: Medicare HMO

## 2018-06-15 ENCOUNTER — Encounter (INDEPENDENT_AMBULATORY_CARE_PROVIDER_SITE_OTHER): Payer: Self-pay | Admitting: Vascular Surgery

## 2018-06-15 ENCOUNTER — Other Ambulatory Visit: Payer: Self-pay

## 2018-06-15 ENCOUNTER — Ambulatory Visit (INDEPENDENT_AMBULATORY_CARE_PROVIDER_SITE_OTHER): Payer: Medicare HMO | Admitting: Vascular Surgery

## 2018-06-15 ENCOUNTER — Ambulatory Visit (INDEPENDENT_AMBULATORY_CARE_PROVIDER_SITE_OTHER): Payer: Medicare HMO | Admitting: Nurse Practitioner

## 2018-06-15 VITALS — BP 131/88 | HR 94 | Resp 20 | Ht 65.0 in | Wt 298.0 lb

## 2018-06-15 DIAGNOSIS — M79604 Pain in right leg: Secondary | ICD-10-CM

## 2018-06-15 DIAGNOSIS — M79605 Pain in left leg: Secondary | ICD-10-CM

## 2018-06-15 DIAGNOSIS — I890 Lymphedema, not elsewhere classified: Secondary | ICD-10-CM | POA: Insufficient documentation

## 2018-06-15 DIAGNOSIS — I872 Venous insufficiency (chronic) (peripheral): Secondary | ICD-10-CM | POA: Diagnosis not present

## 2018-06-15 DIAGNOSIS — G2581 Restless legs syndrome: Secondary | ICD-10-CM | POA: Insufficient documentation

## 2018-06-15 DIAGNOSIS — J449 Chronic obstructive pulmonary disease, unspecified: Secondary | ICD-10-CM

## 2018-06-15 DIAGNOSIS — Z791 Long term (current) use of non-steroidal anti-inflammatories (NSAID): Secondary | ICD-10-CM

## 2018-06-15 DIAGNOSIS — M5136 Other intervertebral disc degeneration, lumbar region: Secondary | ICD-10-CM

## 2018-06-15 DIAGNOSIS — Z79899 Other long term (current) drug therapy: Secondary | ICD-10-CM

## 2018-06-15 MED ORDER — PREGABALIN 25 MG PO CAPS: 25.0000 mg | ORAL_CAPSULE | Freq: Two times a day (BID) | ORAL | 2 refills | Status: DC

## 2018-06-15 NOTE — Progress Notes (Signed)
MRN : [id number]  Brandi Fowler is a 53 y.o. (Feb 03, 1966) female who presents with chief complaint of No chief complaint on file. Marland Kitchen  History of Present Illness:   The patient returns to the office for followup evaluation regarding leg swelling.  The swelling has persisted and the pain associated with swelling continues. There have not been any interval development of a ulcerations or wounds.  Since the previous visit the patient has been wearing graduated compression stockings and has noted little if any improvement in the lymphedema. The patient has been using compression routinely morning until night.  She is also now complaining of restless leg syndrome which is very painful and keeps her awake all night.  The patient also states elevation during the day and exercise is being done too.  ABI's are normal bilaterally Duplex ultrasound of the venous system bilaterally is negative for DVT mild superficial reflux on the right, no superficial reflux on the left.  Mild deep venous reflux left  more than right.  No outpatient medications have been marked as taking for the 06/15/18 encounter (Appointment) with Delana Meyer, Dolores Lory, MD.    Past Medical History:  Diagnosis Date  . BRBPR (bright red blood per rectum)   . Chronic diarrhea   . Chronic kidney disease   . Chronic myofascial pain   . Chronic pain   . COPD (chronic obstructive pulmonary disease) ([place])   . DDD (degenerative disc disease), cervical   . DDD (degenerative disc disease), cervical   . Depression   . GERD (gastroesophageal reflux disease)   . Headache    migraines  . Kidney stones   . Obesity, Class III, BMI 40-49.9 (morbid obesity) ([place])   . Schizophrenia ([place])   . Seizures ([place])     Past Surgical History:  Procedure Laterality Date  . CHOLECYSTECTOMY    . COLONOSCOPY WITH PROPOFOL N/A 11/18/2014   Procedure: COLONOSCOPY WITH PROPOFOL;  Surgeon: Manya Silvas, MD;  Location: [facility] ENDOSCOPY;  Service:  Endoscopy;  Laterality: N/A;  . ESOPHAGOGASTRODUODENOSCOPY    . KNEE SURGERY Left   . polpectomy N/A   . SPINE SURGERY    . TUBAL LIGATION      Social History Social History   Tobacco Use  . Smoking status: Current Every Day Smoker    Packs/day: 0.25    Types: Cigarettes  . Smokeless tobacco: Former Network engineer Use Topics  . Alcohol use: Yes    Alcohol/week: 1.0 standard drinks    Types: 1 Standard drinks or equivalent per week  . Drug use: No    Family History Family History  Problem Relation Age of Onset  . Alcohol abuse Mother   . Cancer Mother   . COPD Mother   . Hearing loss Mother   . Vision loss Mother   . Alcohol abuse Father   . Depression Father   . Early death Father   . Mental illness Father     Allergies  Allergen Reactions  . Abilify [Aripiprazole] Swelling  . Baclofen Swelling  . Bromfenac   . Buprenorphine   . Buprenorphine-Naloxone Other (See Comments)  . Bupropion   . Ciprofloxacin   . Ciprofloxacin Hcl Other (See Comments)  . Clindamycin/Lincomycin   . Clonazepam   . Codeine   . Colestipol   . Doxepin   . Etodolac   . Fentanyl   . Flexeril [Cyclobenzaprine]   . Flurbiprofen   . Gabapentin   . Haloperidol  Other reaction(s): Headache  . Hydrocodone   . Ibuprofen Other (See Comments)    Bleeding in stomach  . Indocin [Indomethacin]   . Ketoprofen   . Latuda [Lurasidone Hcl]   . Lurasidone Swelling  . Lyrica [Pregabalin]   . Meloxicam   . Methadone   . Metronidazole   . Mirtazapine   . Nabumetone   . Naproxen   . Opana [Oxymorphone Hcl] Other (See Comments)    seizures  . Oxaprozin   . Oxycodone   . Oxymorphone Other (See Comments)  . Paroxetine   . Penicillins Swelling  . Quetiapine Other (See Comments)  . Seroquel [Quetiapine Fumarate]   . Sertraline Diarrhea  . Suboxone [Buprenorphine Hcl-Naloxone Hcl] Other (See Comments)    seizures  . Tolmetin   . Tramadol   . Trazodone Itching  . Tylenol  [Acetaminophen]   . Valproic Acid Diarrhea  . Zoloft [Sertraline Hcl] Nausea And Vomiting  . Clindamycin Rash     REVIEW OF SYSTEMS (Negative unless checked)  Constitutional: [] Weight loss  [] Fever  [] Chills Cardiac: [] Chest pain   [] Chest pressure   [] Palpitations   [] Shortness of breath when laying flat   [] Shortness of breath with exertion. Vascular:  [] Pain in legs with walking   [] Pain in legs at rest  [] History of DVT   [] Phlebitis   [] Swelling in legs   [] Varicose veins   [] Non-healing ulcers Pulmonary:   [] Uses home oxygen   [] Productive cough   [] Hemoptysis   [] Wheeze  [] COPD   [] Asthma Neurologic:  [] Dizziness   [] Seizures   [] History of stroke   [] History of TIA  [] Aphasia   [] Vissual changes   [] Weakness or numbness in arm   [] Weakness or numbness in leg Musculoskeletal:   [] Joint swelling   [] Joint pain   [] Low back pain Hematologic:  [] Easy bruising  [] Easy bleeding   [] Hypercoagulable state   [] Anemic Gastrointestinal:  [] Diarrhea   [] Vomiting  [] Gastroesophageal reflux/heartburn   [] Difficulty swallowing. Genitourinary:  [] Chronic kidney disease   [] Difficult urination  [] Frequent urination   [] Blood in urine Skin:  [] Rashes   [] Ulcers  Psychological:  [] History of anxiety   []  History of major depression.  Physical Examination  There were no vitals filed for this visit. There is no height or weight on file to calculate BMI. Gen: WD/WN, NAD Head: [place]/AT, No temporalis wasting.  Ear/Nose/Throat: Hearing grossly intact, nares w/o erythema or drainage Eyes: PER, EOMI, sclera nonicteric.  Neck: Supple, no large masses.   Pulmonary:  Good air movement, no audible wheezing bilaterally, no use of accessory muscles.  Cardiac: RRR, no JVD Vascular: scattered varicosities present bilaterally.  Moderate venous stasis changes to the legs bilaterally.  4+ soft pitting edema Gastrointestinal: Non-distended. No guarding/no peritoneal signs.  Musculoskeletal: M/S 5/5 throughout.  No  deformity or atrophy.  Neurologic: CN 2-12 intact. Symmetrical.  Speech is fluent. Motor exam as listed above. Psychiatric: Judgment intact, Mood & affect appropriate for pt's clinical situation. Dermatologic: venous rashes no ulcers noted.  No changes consistent with cellulitis. Lymph : No lichenification or skin changes of chronic lymphedema.  CBC Lab Results  Component Value Date   WBC 8.9 01/25/2012   HGB 13.2 01/25/2012   HCT 37.8 01/25/2012   MCV 95 01/25/2012   PLT 259 01/25/2012    BMET    Component Value Date/Time   NA 140 01/25/2012 1944   K 3.7 01/25/2012 1944   CL 108 (H) 01/25/2012 1944   CO2 22 01/25/2012  1944   GLUCOSE 101 (H) 01/25/2012 1944   BUN 14 01/25/2012 1944   CREATININE 0.58 (L) 03/24/2012 0719   CALCIUM 9.2 01/25/2012 1944   GFRNONAA >60 03/24/2012 0719   GFRAA >60 03/24/2012 0719   CrCl cannot be calculated (Patient's most recent lab result is older than the maximum 21 days allowed.).  COAG No results found for: INR, PROTIME  Radiology No results found.   Assessment/Plan 1. Lymphedema Recommend:  No surgery or intervention at this point in time.    I have reviewed my previous discussion with the patient regarding swelling and why it causes symptoms.  Patient will continue wearing graduated compression stockings class 1 (20-30 mmHg) on a daily basis. The patient will  beginning wearing the stockings first thing in the morning and removing them in the evening. The patient is instructed specifically not to sleep in the stockings.    In addition, behavioral modification including several periods of elevation of the lower extremities during the day will be continued.  This was reviewed with the patient during the initial visit.  The patient will also continue routine exercise, especially walking on a daily basis as was discussed during the initial visit.    Despite conservative treatments including graduated compression therapy class 1 and  behavioral modification including exercise and elevation the patient  has not obtained adequate control of the lymphedema.  The patient still has stage 3 lymphedema and therefore, I believe that a lymph pump should be added to improve the control of the patient's lymphedema.  Additionally, a lymph pump is warranted because it will reduce the risk of cellulitis and ulceration in the future.  Patient should follow-up in six months    2. Restless legs syndrome The patient is complaining of severe symptoms and is requesting treatment.  We have reviewed the possibility of using Lyrica.  She does have it listed as an allergy but she is unaware of what the reaction was.  After significant discussion we have decided to begin a low dose and so we will start at 25 mg twice daily.  Prescription was sent.  We can adjust the dose pending her tolerance.  3. Chronic venous insufficiency No surgery or intervention at this point in time.    I have had a long discussion with the patient regarding venous insufficiency and why it  causes symptoms. I have discussed with the patient the chronic skin changes that accompany venous insufficiency and the long term sequela such as infection and ulceration.  Patient will begin wearing graduated compression stockings class 1 (20-30 mmHg) or compression wraps on a daily basis a prescription was given. The patient will put the stockings on first thing in the morning and removing them in the evening. The patient is instructed specifically not to sleep in the stockings.    In addition, behavioral modification including several periods of elevation of the lower extremities during the day will be continued. I have demonstrated that proper elevation is a position with the ankles at heart level.  The patient is instructed to begin routine exercise, especially walking on a daily basis  4. DDD (degenerative disc disease), lumbar Continue NSAID medications as already ordered, these  medications have been reviewed and there are no changes at this time.  Continued activity and therapy was stressed.   5. Chronic obstructive pulmonary disease, unspecified COPD type ([place]) Continue pulmonary medications and aerosols as already ordered, these medications have been reviewed and there are no changes at this time.  Hortencia Pilar, MD  06/15/2018 3:07 PM

## 2018-06-20 ENCOUNTER — Other Ambulatory Visit (INDEPENDENT_AMBULATORY_CARE_PROVIDER_SITE_OTHER): Payer: Self-pay | Admitting: Nurse Practitioner

## 2018-06-22 ENCOUNTER — Other Ambulatory Visit (INDEPENDENT_AMBULATORY_CARE_PROVIDER_SITE_OTHER): Payer: Self-pay | Admitting: Nurse Practitioner

## 2018-06-22 DIAGNOSIS — G2581 Restless legs syndrome: Secondary | ICD-10-CM

## 2018-06-22 MED ORDER — ROPINIROLE HCL 0.5 MG PO TABS
0.5000 mg | ORAL_TABLET | Freq: Three times a day (TID) | ORAL | 11 refills | Status: DC
Start: 2018-06-22 — End: 2018-06-22

## 2018-06-22 MED ORDER — ROPINIROLE HCL 0.5 MG PO TABS: 0.5000 mg | ORAL_TABLET | Freq: Every day | ORAL | 11 refills | Status: AC

## 2018-07-28 ENCOUNTER — Other Ambulatory Visit: Payer: Self-pay

## 2018-07-28 ENCOUNTER — Other Ambulatory Visit: Payer: Self-pay | Admitting: Family

## 2018-07-28 ENCOUNTER — Ambulatory Visit
Admission: RE | Admit: 2018-07-28 | Discharge: 2018-07-28 | Disposition: A | Discharge status: Home / Self Care | Payer: Medicare HMO | Source: Ambulatory Visit | Attending: Family | Admitting: Family

## 2018-07-28 DIAGNOSIS — M79671 Pain in right foot: Secondary | ICD-10-CM

## 2018-08-30 ENCOUNTER — Other Ambulatory Visit: Payer: Self-pay

## 2018-08-30 ENCOUNTER — Inpatient Hospital Stay
Admission: EM | Admit: 2018-08-30 | Discharge: 2018-08-31 | DRG: 871 | Discharge status: Against Medical Advice | Payer: Medicare Other | Attending: Internal Medicine | Admitting: Internal Medicine

## 2018-08-30 ENCOUNTER — Emergency Department: Payer: Medicare Other

## 2018-08-30 ENCOUNTER — Encounter: Payer: Self-pay | Admitting: Emergency Medicine

## 2018-08-30 DIAGNOSIS — G8929 Other chronic pain: Secondary | ICD-10-CM | POA: Diagnosis present

## 2018-08-30 DIAGNOSIS — Z6841 Body Mass Index (BMI) 40.0 and over, adult: Secondary | ICD-10-CM | POA: Diagnosis not present

## 2018-08-30 DIAGNOSIS — A419 Sepsis, unspecified organism: Secondary | ICD-10-CM | POA: Diagnosis not present

## 2018-08-30 DIAGNOSIS — Z79899 Other long term (current) drug therapy: Secondary | ICD-10-CM

## 2018-08-30 DIAGNOSIS — F17210 Nicotine dependence, cigarettes, uncomplicated: Secondary | ICD-10-CM | POA: Diagnosis not present

## 2018-08-30 DIAGNOSIS — K219 Gastro-esophageal reflux disease without esophagitis: Secondary | ICD-10-CM | POA: Diagnosis present

## 2018-08-30 DIAGNOSIS — Z87442 Personal history of urinary calculi: Secondary | ICD-10-CM | POA: Diagnosis not present

## 2018-08-30 DIAGNOSIS — Z885 Allergy status to narcotic agent status: Secondary | ICD-10-CM

## 2018-08-30 DIAGNOSIS — G2581 Restless legs syndrome: Secondary | ICD-10-CM | POA: Diagnosis not present

## 2018-08-30 DIAGNOSIS — J449 Chronic obstructive pulmonary disease, unspecified: Secondary | ICD-10-CM | POA: Diagnosis present

## 2018-08-30 DIAGNOSIS — F329 Major depressive disorder, single episode, unspecified: Secondary | ICD-10-CM | POA: Diagnosis not present

## 2018-08-30 DIAGNOSIS — J441 Chronic obstructive pulmonary disease with (acute) exacerbation: Secondary | ICD-10-CM | POA: Diagnosis present

## 2018-08-30 DIAGNOSIS — N189 Chronic kidney disease, unspecified: Secondary | ICD-10-CM | POA: Diagnosis not present

## 2018-08-30 DIAGNOSIS — Z888 Allergy status to other drugs, medicaments and biological substances status: Secondary | ICD-10-CM

## 2018-08-30 DIAGNOSIS — Z818 Family history of other mental and behavioral disorders: Secondary | ICD-10-CM

## 2018-08-30 DIAGNOSIS — J9601 Acute respiratory failure with hypoxia: Secondary | ICD-10-CM | POA: Diagnosis present

## 2018-08-30 DIAGNOSIS — Z1159 Encounter for screening for other viral diseases: Secondary | ICD-10-CM

## 2018-08-30 DIAGNOSIS — Z880 Allergy status to penicillin: Secondary | ICD-10-CM

## 2018-08-30 DIAGNOSIS — G40909 Epilepsy, unspecified, not intractable, without status epilepticus: Secondary | ICD-10-CM

## 2018-08-30 DIAGNOSIS — R0602 Shortness of breath: Secondary | ICD-10-CM | POA: Diagnosis present

## 2018-08-30 DIAGNOSIS — J440 Chronic obstructive pulmonary disease with acute lower respiratory infection: Secondary | ICD-10-CM | POA: Diagnosis present

## 2018-08-30 DIAGNOSIS — K529 Noninfective gastroenteritis and colitis, unspecified: Secondary | ICD-10-CM | POA: Diagnosis not present

## 2018-08-30 DIAGNOSIS — Z881 Allergy status to other antibiotic agents status: Secondary | ICD-10-CM

## 2018-08-30 DIAGNOSIS — J189 Pneumonia, unspecified organism: Secondary | ICD-10-CM | POA: Diagnosis not present

## 2018-08-30 DIAGNOSIS — F209 Schizophrenia, unspecified: Secondary | ICD-10-CM | POA: Diagnosis present

## 2018-08-30 DIAGNOSIS — Z886 Allergy status to analgesic agent status: Secondary | ICD-10-CM

## 2018-08-30 DIAGNOSIS — Z825 Family history of asthma and other chronic lower respiratory diseases: Secondary | ICD-10-CM

## 2018-08-30 DIAGNOSIS — Z7952 Long term (current) use of systemic steroids: Secondary | ICD-10-CM

## 2018-08-30 DIAGNOSIS — Z79891 Long term (current) use of opiate analgesic: Secondary | ICD-10-CM

## 2018-08-30 LAB — COMPREHENSIVE METABOLIC PANEL
ALT: 36 U/L (ref 0–44)
AST: 25 U/L (ref 15–41)
Albumin: 4 g/dL (ref 3.5–5.0)
Alkaline Phosphatase: 75 U/L (ref 38–126)
Anion gap: 10 (ref 5–15)
BUN: 19 mg/dL (ref 6–20)
CO2: 25 mmol/L (ref 22–32)
Calcium: 9.4 mg/dL (ref 8.9–10.3)
Chloride: 105 mmol/L (ref 98–111)
Creatinine, Ser: 0.86 mg/dL (ref 0.44–1.00)
GFR calc Af Amer: >60 mL/min (ref >60)
GFR calc non Af Amer: >60 mL/min (ref >60)
Glucose, Bld: 111 mg/dL — ABNORMAL HIGH (ref 70–99)
Potassium: 3.7 mmol/L (ref 3.5–5.1)
Sodium: 140 mmol/L (ref 135–145)
Total Bilirubin: 0.3 mg/dL (ref 0.3–1.2)
Total Protein: 7.5 g/dL (ref 6.5–8.1)

## 2018-08-30 LAB — CBC WITH DIFFERENTIAL/PLATELET
Abs Immature Granulocytes: 0.1 10*3/uL — ABNORMAL HIGH (ref 0.00–0.07)
Basophils Absolute: 0.1 10*3/uL (ref 0.0–0.1)
Basophils Relative: 1 %
Eosinophils Absolute: 0.2 10*3/uL (ref 0.0–0.5)
Eosinophils Relative: 2 %
HCT: 46 % (ref 36.0–46.0)
Hemoglobin: 15.1 g/dL — ABNORMAL HIGH (ref 12.0–15.0)
Immature Granulocytes: 1 %
Lymphocytes Relative: 18 %
Lymphs Abs: 2.4 10*3/uL (ref 0.7–4.0)
MCH: 31.9 pg (ref 26.0–34.0)
MCHC: 32.8 g/dL (ref 30.0–36.0)
MCV: 97 fL (ref 80.0–100.0)
Monocytes Absolute: 0.8 10*3/uL (ref 0.1–1.0)
Monocytes Relative: 6 %
Neutro Abs: 9.5 10*3/uL — ABNORMAL HIGH (ref 1.7–7.7)
Neutrophils Relative %: 72 %
Platelets: 300 10*3/uL (ref 150–400)
RBC: 4.74 MIL/uL (ref 3.87–5.11)
RDW: 14.3 % (ref 11.5–15.5)
WBC: 13 10*3/uL — ABNORMAL HIGH (ref 4.0–10.5)
nRBC: 0 % (ref 0.0–0.2)

## 2018-08-30 LAB — SARS CORONAVIRUS 2 BY RT PCR (HOSPITAL ORDER, PERFORMED IN ~~LOC~~ HOSPITAL LAB): SARS Coronavirus 2: NEGATIVE

## 2018-08-30 LAB — BRAIN NATRIURETIC PEPTIDE: B Natriuretic Peptide: 58 pg/mL (ref 0.0–100.0)

## 2018-08-30 MED ORDER — SODIUM CHLORIDE 0.9 % IV SOLN
500.0000 mg | INTRAVENOUS | Status: DC
  Administered 2018-08-30: 500 mg via INTRAVENOUS
  Filled 2018-08-30 (×2): qty 500

## 2018-08-30 MED ORDER — METHYLPREDNISOLONE SODIUM SUCC 125 MG IJ SOLR
125.0000 mg | Freq: Once | INTRAMUSCULAR | Status: AC
  Administered 2018-08-30: 23:00:00 125 mg via INTRAVENOUS
  Filled 2018-08-30: qty 2

## 2018-08-30 MED ORDER — IPRATROPIUM-ALBUTEROL 0.5-2.5 (3) MG/3ML IN SOLN
3.0000 mL | Freq: Once | RESPIRATORY_TRACT | Status: AC
  Administered 2018-08-30: 3 mL via RESPIRATORY_TRACT
  Filled 2018-08-30: qty 3

## 2018-08-30 MED ORDER — SODIUM CHLORIDE 0.9 % IV SOLN
2.0000 g | INTRAVENOUS | Status: DC
  Administered 2018-08-30: 2 g via INTRAVENOUS
  Filled 2018-08-30 (×2): qty 20

## 2018-08-30 MED ORDER — ALBUTEROL SULFATE (2.5 MG/3ML) 0.083% IN NEBU
5.0000 mg | INHALATION_SOLUTION | Freq: Once | RESPIRATORY_TRACT | Status: AC
  Administered 2018-08-30: 5 mg via RESPIRATORY_TRACT
  Filled 2018-08-30: qty 6

## 2018-08-30 NOTE — ED Triage Notes (Signed)
Pt presents to ED with sob that increases with exertion. Denies chest pain. Increased work of breathing noted at this time. Symptoms started Friday.

## 2018-08-30 NOTE — ED Notes (Signed)
Pt oxygen saturation was 86% on RA. Pt placed on 2L with oxygen improving to 97%. EDP made aware.

## 2018-08-30 NOTE — ED Provider Notes (Addendum)
[facility] Emergency Department Provider Note  ____________________________________________  Time seen: Approximately 10:19 PM  I have reviewed the triage vital signs and the nursing notes.   HISTORY  Chief Complaint Shortness of Breath    HPI Brandi Fowler is a 53 y.o. female with a history of COPD, chronic pain, kidney stones, seizure disorder who comes the ED complaining of worsening shortness of breath on exertion for the past 4 days.  No orthopnea.  No chest pain.  She has a nonproductive cough.  Shortness of breath is constant, worse with exertion, better at rest sitting upright.      Past Medical History:  Diagnosis Date  . BRBPR (bright red blood per rectum)   . Chronic diarrhea   . Chronic kidney disease   . Chronic myofascial pain   . Chronic pain   . COPD (chronic obstructive pulmonary disease) ([place])   . DDD (degenerative disc disease), cervical   . DDD (degenerative disc disease), cervical   . Depression   . GERD (gastroesophageal reflux disease)   . Headache    migraines  . Kidney stones   . Obesity, Class III, BMI 40-49.9 (morbid obesity) ([place])   . Schizophrenia ([place])   . Seizures [facility])      Patient Active Problem List   Diagnosis Date Noted  . Restless legs syndrome 06/15/2018  . Chronic venous insufficiency 06/15/2018  . Lymphedema 06/15/2018  . Leg pain 02/09/2018  . COPD (chronic obstructive pulmonary disease) ([place]) 02/09/2018  . 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 DDD (degenerative disc disease), lumbar 07/30/2014  . Thoracic facet syndrome 07/30/2014  . Facet syndrome, lumbar 07/30/2014  . 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 CHOLECYSTECTOMY    . COLONOSCOPY WITH PROPOFOL N/A 11/18/2014   Procedure: COLONOSCOPY WITH PROPOFOL;  Surgeon: Manya Silvas, MD;  Location: [facility] ENDOSCOPY;  Service: Endoscopy;  Laterality: N/A;  .  ESOPHAGOGASTRODUODENOSCOPY    . KNEE SURGERY Left   . polpectomy N/A   . SPINE SURGERY    . TUBAL LIGATION       Prior to Admission medications   Medication Sig Start Date End Date Taking? Authorizing Provider  albuterol (ACCUNEB) 1.25 MG/3ML nebulizer solution Take 1 ampule by nebulization every 6 (six) hours as needed for wheezing.    [provider]  fluticasone (FLONASE) 50 MCG/ACT nasal spray Place into the nose. 04/07/17 04/07/18  [provider]  haloperidol (HALDOL) 1 MG tablet Take 1 tablet (1 mg total) by mouth at bedtime. 07/26/16 07/26/17  Arfeen, Arlyce Harman, MD  ipratropium-albuterol (DUONEB) 0.5-2.5 (3) MG/3ML SOLN Take 3 mLs by nebulization.    [provider]  morphine (MS CONTIN) 15 MG 12 hr tablet Take 15 mg by mouth every 12 (twelve) hours.    [provider]  naloxone [facility]) nasal spray 4 mg/0.1 mL CALL 911. INSTILL 1 SPRAY IN ONE NOSTRIL. MAY REPEAT Q 2 TO 3 MINUTES IF SYMPTOMS OF AN OPIOID EMERGENCY PERSISTS. ALTERNATE NOSTRILS. 07/13/16   [provider]  oxyCODONE (OXY IR/ROXICODONE) 5 MG immediate release tablet 15 mg every 6 (six) hours as needed.  07/13/16   [provider]  Roflumilast (DALIRESP) 250 MCG TABS Take by mouth.    [provider]  rOPINIRole (REQUIP) 0.5 MG tablet Take 1 tablet (0.5 mg total) by mouth at bedtime. 06/22/18 06/22/19  Kris Hartmann, NP  tiotropium (SPIRIVA) 18 MCG inhalation capsule Place  into inhaler and inhale. 04/07/17 04/07/18  [provider]  tiZANidine (ZANAFLEX) 4 MG tablet TK 1 T PO BID PRN 01/30/18   [provider]  traZODone (DESYREL) 50 MG tablet TK 1 TO 2 T PO HS 01/17/18   [provider]     Allergies Abilify [aripiprazole], Baclofen, Bromfenac, Buprenorphine, Buprenorphine-naloxone, Bupropion, Ciprofloxacin, Ciprofloxacin hcl, Clindamycin/lincomycin, Clonazepam, Codeine, Colestipol, Doxepin, Etodolac, Fentanyl, Flexeril [cyclobenzaprine],  Flurbiprofen, Gabapentin, Haloperidol, Hydrocodone, Ibuprofen, Indocin [indomethacin], Ketoprofen, Latuda [lurasidone hcl], Lurasidone, Lyrica [pregabalin], Meloxicam, Methadone, Metronidazole, Mirtazapine, Nabumetone, Naproxen, Opana [oxymorphone hcl], Oxaprozin, Oxycodone, Oxymorphone, Paroxetine, Penicillins, Quetiapine, Seroquel [quetiapine fumarate], Sertraline, Suboxone [buprenorphine hcl-naloxone hcl], Tolmetin, Tramadol, Trazodone, Tylenol [acetaminophen], Valproic acid, Zoloft [sertraline hcl], and Clindamycin   Family History  Problem Relation Age of Onset  . Alcohol abuse Mother   . Cancer Mother   . COPD Mother   . Hearing loss Mother   . Vision loss Mother   . Alcohol abuse Father   . Depression Father   . Early death Father   . Mental illness Father     Social History Social History   Tobacco Use  . Smoking status: Current Every Day Smoker    Packs/day: 0.25    Types: Cigarettes  . Smokeless tobacco: Former Network engineer Use Topics  . Alcohol use: Yes    Alcohol/week: 1.0 standard drinks    Types: 1 Standard drinks or equivalent per week  . Drug use: No    Review of Systems  Constitutional:   No fever or chills.  ENT:   No sore throat. No rhinorrhea. Cardiovascular:   No chest pain or syncope. Respiratory:   Positive shortness of breath and nonproductive cough. Gastrointestinal:   Negative for abdominal pain, vomiting and diarrhea.  Musculoskeletal: Positive chronic left leg swelling All other systems reviewed and are negative except as documented above in ROS and HPI.  ____________________________________________   PHYSICAL EXAM:  VITAL SIGNS: ED Triage Vitals  Enc Vitals Group     BP 08/30/18 1933 (!) 159/87     Pulse Rate 08/30/18 1933 (!) 120     Resp 08/30/18 1933 (!) 32     Temp 08/30/18 1955 98.2 F (36.8 C)     Temp Source 08/30/18 1933 Oral     SpO2 08/30/18 1933 (!) 84 %     Weight 08/30/18 1934 (!) 311 lb (141.1 kg)     Height  08/30/18 1934 5\' 5"  (1.651 m)     Head Circumference --      Peak Flow --      Pain Score 08/30/18 1933 0     Pain Loc --      Pain Edu? --      Excl. in [place]? --     Vital signs reviewed, nursing assessments reviewed.   Constitutional:   Alert and oriented. Non-toxic appearance.  Morbidly obese. Eyes:   Conjunctivae are normal. EOMI. PERRL. ENT      Head:   Normocephalic and atraumatic.      Nose:   No congestion/rhinnorhea.       Mouth/Throat:   MMM, no pharyngeal erythema. No peritonsillar mass.       Neck:   No meningismus. Full ROM. Hematological/Lymphatic/Immunilogical:   No cervical lymphadenopathy. Cardiovascular:   RRR, heart rate 90. Symmetric bilateral radial and DP pulses.  No murmurs. Cap refill less than 2 seconds. Respiratory:   Tachypnea.  Normal work of breathing.  Symmetric air entry.  No wheezing.  Bilateral basilar crackles.  Gastrointestinal:   Soft and nontender. Non distended. There is no CVA tenderness.  No rebound, rigidity, or guarding.  Musculoskeletal:   Normal range of motion in all extremities. No joint effusions.  No lower extremity tenderness.  Left calf circumference greater than right, chronic per patient.  No edema. Neurologic:   Normal speech and language.  Motor grossly intact. No acute focal neurologic deficits are appreciated.  Skin:    Skin is warm, dry and intact. No rash noted.  No petechiae, purpura, or bullae.  ____________________________________________    LABS (pertinent positives/negatives) (all labs ordered are listed, but only abnormal results are displayed) Labs Reviewed  CBC WITH DIFFERENTIAL/PLATELET - Abnormal; Notable for the following components:      Result Value   WBC 13.0 (*)    Hemoglobin 15.1 (*)    Neutro Abs 9.5 (*)    Abs Immature Granulocytes 0.10 (*)    All other components within normal limits  COMPREHENSIVE METABOLIC PANEL - Abnormal; Notable for the following components:   Glucose, Bld 111 (*)    All  other components within normal limits  SARS CORONAVIRUS 2 (HOSPITAL ORDER, [facility])  BRAIN NATRIURETIC PEPTIDE   ____________________________________________   EKG  Interpreted by me Sinus tachycardia rate 107, normal axis intervals QRS ST segments and T waves.  ____________________________________________    RDEYCXKGY  Dg Chest 2 View  Result Date: 08/30/2018 CLINICAL DATA:  Shortness of breath EXAM: CHEST - 2 VIEW COMPARISON:  X-ray from 08/31/2017 could not be retrieved for comparison. FINDINGS: The heart size is mildly enlarged. There are bibasilar airspace opacities. No pneumothorax. There appear to be trace bilateral pleural effusions. There is no acute osseous abnormality. The lungs appear somewhat hyperexpanded. The patient is status post prior ACDF of the lower cervical spine. IMPRESSION: 1. Streaky bibasilar airspace opacities which are nonspecific but can be seen in patients with volume overload/pulmonary edema or an atypical infectious process. Atelectasis can have a similar appearance. 2. The lungs are somewhat hyperexpanded which can be seen in patients with COPD. Electronically Signed   By: Constance Holster M.D.   On: 08/30/2018 20:12    ____________________________________________   PROCEDURES .Critical Care Performed by: Carrie Mew, MD Authorized by: Carrie Mew, MD   Critical care provider statement:    Critical care time (minutes):  35   Critical care time was exclusive of:  Separately billable procedures and treating other patients   Critical care was necessary to treat or prevent imminent or life-threatening deterioration of the following conditions:  Respiratory failure   Critical care was time spent personally by me on the following activities:  Development of treatment plan with patient or surrogate, discussions with consultants, evaluation of patient's response to treatment, examination of patient, obtaining  history from patient or surrogate, ordering and performing treatments and interventions, ordering and review of laboratory studies, ordering and review of radiographic studies, pulse oximetry, re-evaluation of patient's condition and review of old charts    ____________________________________________  DIFFERENTIAL DIAGNOSIS   Pneumonia, COPD exacerbation, COVID-19.  Doubt ACS PE dissection AAA pneumothorax pericarditis pulmonary edema  CLINICAL IMPRESSION / ASSESSMENT AND PLAN / ED COURSE  Medications ordered in the ED: Medications  cefTRIAXone (ROCEPHIN) 2 g in sodium chloride 0.9 % 100 mL IVPB (2 g Intravenous New Bag/Given 08/30/18 2053)  azithromycin (ZITHROMAX) 500 mg in sodium chloride 0.9 % 250 mL IVPB (500 mg Intravenous New Bag/Given 08/30/18 2147)  methylPREDNISolone sodium succinate (SOLU-MEDROL) 125 mg/2 mL injection  125 mg (has no administration in time range)  ipratropium-albuterol (DUONEB) 0.5-2.5 (3) MG/3ML nebulizer solution 3 mL (has no administration in time range)  albuterol (PROVENTIL) (2.5 MG/3ML) 0.083% nebulizer solution 5 mg (has no administration in time range)    Pertinent labs & imaging results that were available during my care of the patient were reviewed by me and considered in my medical decision making (see chart for details).  Brandi Fowler was evaluated in Emergency Department on 08/30/2018 for the symptoms described in the history of present illness. She was evaluated in the context of the global COVID-19 pandemic, which necessitated consideration that the patient might be at risk for infection with the SARS-CoV-2 virus that causes COVID-19. Institutional protocols and algorithms that pertain to the evaluation of patients at risk for COVID-19 are in a state of rapid change based on information released by regulatory bodies including the CDC and federal and state organizations. These policies and algorithms were followed during the patient's care in the  ED.   Patient presents with acute hypoxic respiratory failure.  Exam and history are most consistent with pneumonia/COPD exacerbation.  Chest x-ray does show bilateral basilar infiltrates.  Coronavirus test is negative.  On my assessment the heart rate is 90, not tachycardic as suggested by triage documentation.  Additionally, her presentation seems purely respiratory, she is nontoxic, I do not think she is septic on initial assessment and so code sepsis was not initiated.  Am giving her ceftriaxone and azithromycin for her pneumonia.      ____________________________________________   FINAL CLINICAL IMPRESSION(S) / ED DIAGNOSES    Final diagnoses:  Atypical pneumonia  Acute respiratory failure with hypoxia ([place])  COPD exacerbation [facility])     ED Discharge Orders    None      Portions of this note were generated with dragon dictation software. Dictation errors may occur despite best attempts at proofreading.   Carrie Mew, MD 08/30/18 2229    Carrie Mew, MD 09/14/18 1450

## 2018-08-30 NOTE — H&P (Signed)
[facility] [place] NAME: Brandi Fowler    MR#:  [id number]  DATE OF BIRTH:  16-May-1965  DATE OF ADMISSION:  08/30/2018  PRIMARY CARE PHYSICIAN: Laneta Simmers, NP   REQUESTING/REFERRING PHYSICIAN: Joni Fears, MD  CHIEF COMPLAINT:   Chief Complaint  Patient presents with  . Shortness of Breath    HISTORY OF PRESENT ILLNESS:  Brandi Fowler  is a 53 y.o. female who presents with chief complaint as above.  Patient presents the ED with a complaint of increasing shortness of breath.  She states that she has been feeling short of breath for the past 4 days or so.  She denies any increased cough.  She has had some wheezing, but states that she has wheezing at baseline.  Here in the ED she required oxygen via nasal cannula.  Work-up showed likely atypical pneumonia.  Hospitalist were called for admission  PAST MEDICAL HISTORY:   Past Medical History:  Diagnosis Date  . BRBPR (bright red blood per rectum)   . Chronic diarrhea   . Chronic kidney disease   . Chronic myofascial pain   . Chronic pain   . COPD (chronic obstructive pulmonary disease) ([place])   . DDD (degenerative disc disease), cervical   . DDD (degenerative disc disease), cervical   . Depression   . GERD (gastroesophageal reflux disease)   . Headache    migraines  . Kidney stones   . Obesity, Class III, BMI 40-49.9 (morbid obesity) ([place])   . Schizophrenia ([place])   . Seizures ([place])      PAST SURGICAL HISTORY:   Past Surgical History:  Procedure Laterality Date  . CHOLECYSTECTOMY    . COLONOSCOPY WITH PROPOFOL N/A 11/18/2014   Procedure: COLONOSCOPY WITH PROPOFOL;  Surgeon: Manya Silvas, MD;  Location: [facility]COPY;  Service: Endoscopy;  Laterality: N/A;  . ESOPHAGOGASTRODUODENOSCOPY    . KNEE SURGERY Left   . polpectomy N/A   . SPINE SURGERY    . TUBAL LIGATION       SOCIAL HISTORY:   Social History   Tobacco Use  . Smoking status: Current  Every Day Smoker    Packs/day: 0.25    Types: Cigarettes  . Smokeless tobacco: Former Network engineer Use Topics  . Alcohol use: Yes    Alcohol/week: 1.0 standard drinks    Types: 1 Standard drinks or equivalent per week     FAMILY HISTORY:   Family History  Problem Relation Age of Onset  . Alcohol abuse Mother   . Cancer Mother   . COPD Mother   . Hearing loss Mother   . Vision loss Mother   . Alcohol abuse Father   . Depression Father   . Early death Father   . Mental illness Father      DRUG ALLERGIES:   Allergies  Allergen Reactions  . Abilify [Aripiprazole] Swelling  . Baclofen Swelling  . Bromfenac   . Buprenorphine   . Buprenorphine-Naloxone Other (See Comments)  . Bupropion   . Ciprofloxacin   . Ciprofloxacin Hcl Other (See Comments)  . Clindamycin/Lincomycin   . Clonazepam   . Codeine   . Colestipol   . Doxepin   . Etodolac   . Fentanyl   . Flexeril [Cyclobenzaprine]   . Flurbiprofen   . Gabapentin   . Haloperidol     Other reaction(s): Headache  . Hydrocodone   . Ibuprofen Other (See Comments)    Bleeding in stomach  .  Indocin [Indomethacin]   . Ketoprofen   . Latuda [Lurasidone Hcl]   . Lurasidone Swelling  . Lyrica [Pregabalin]   . Meloxicam   . Methadone   . Metronidazole   . Mirtazapine   . Nabumetone   . Naproxen   . Opana [Oxymorphone Hcl] Other (See Comments)    seizures  . Oxaprozin   . Oxycodone   . Oxymorphone Other (See Comments)  . Paroxetine   . Penicillins Swelling  . Quetiapine Other (See Comments)  . Seroquel [Quetiapine Fumarate]   . Sertraline Diarrhea  . Suboxone [Buprenorphine Hcl-Naloxone Hcl] Other (See Comments)    seizures  . Tolmetin   . Tramadol   . Trazodone Itching  . Tylenol [Acetaminophen]   . Valproic Acid Diarrhea  . Zoloft [Sertraline Hcl] Nausea And Vomiting  . Clindamycin Rash    MEDICATIONS AT HOME:   Prior to Admission medications   Medication Sig Start Date End Date Taking?  Authorizing Provider  albuterol (ACCUNEB) 1.25 MG/3ML nebulizer solution Take 1 ampule by nebulization every 6 (six) hours as needed for wheezing.   Yes [provider]  azelastine (ASTELIN) 0.1 % nasal spray Place 1 spray into both nostrils 2 (two) times daily. Use in each nostril as directed   Yes [provider]  docusate sodium (COLACE) 100 MG capsule Take 100 mg by mouth 2 (two) times daily as needed for mild constipation.   Yes [provider]  fluticasone (FLONASE) 50 MCG/ACT nasal spray Place 1 spray into the nose 2 (two) times a day.  04/07/17 08/30/18 Yes [provider]  methocarbamol (ROBAXIN) 500 MG tablet Take 500-1,000 mg by mouth every 6 (six) hours as needed for muscle spasms.   Yes [provider]  Multiple Vitamin (MULTIVITAMIN WITH MINERALS) TABS tablet Take 1 tablet by mouth daily.   Yes [provider]  omega-3 acid ethyl esters (LOVAZA) 1 g capsule Take 2 g by mouth daily.   Yes [provider]  oxyCODONE (OXY IR/ROXICODONE) 5 MG immediate release tablet Take 10 mg by mouth every 6 (six) hours.  07/13/16  Yes [provider]  perphenazine (TRILAFON) 8 MG tablet Take 16 mg by mouth 2 (two) times daily.   Yes [provider]  predniSONE (DELTASONE) 5 MG tablet Take 5 mg by mouth daily with breakfast.   Yes [provider]  rOPINIRole (REQUIP) 0.5 MG tablet Take 1 tablet (0.5 mg total) by mouth at bedtime. 06/22/18 06/22/19 Yes Kris Hartmann, NP  haloperidol (HALDOL) 1 MG tablet Take 1 tablet (1 mg total) by mouth at bedtime. 07/26/16 07/26/17  Arfeen, Arlyce Harman, MD  ipratropium-albuterol (DUONEB) 0.5-2.5 (3) MG/3ML SOLN Take 3 mLs by nebulization.    [provider]  morphine (MS CONTIN) 15 MG 12 hr tablet Take 15 mg by mouth every 12 (twelve) hours.    [provider]  naloxone [facility]) nasal spray 4 mg/0.1 mL CALL 911. INSTILL 1 SPRAY IN ONE NOSTRIL. MAY REPEAT Q 2 TO 3 MINUTES IF  SYMPTOMS OF AN OPIOID EMERGENCY PERSISTS. ALTERNATE NOSTRILS. 07/13/16   [provider]  Roflumilast (DALIRESP) 250 MCG TABS Take by mouth.    [provider]  tiotropium (SPIRIVA) 18 MCG inhalation capsule Place into inhaler and inhale. 04/07/17 04/07/18  [provider]  tiZANidine (ZANAFLEX) 4 MG tablet TK 1 T PO BID PRN 01/30/18   [provider]  traZODone (DESYREL) 50 MG tablet TK 1 TO 2 T PO HS 01/17/18  [provider]    REVIEW OF SYSTEMS:  Review of Systems  Constitutional: Negative for chills, fever, malaise/fatigue and weight loss.  HENT: Negative for ear pain, hearing loss and tinnitus.   Eyes: Negative for blurred vision, double vision, pain and redness.  Respiratory: Positive for shortness of breath. Negative for cough and hemoptysis.   Cardiovascular: Negative for chest pain, palpitations, orthopnea and leg swelling.  Gastrointestinal: Negative for abdominal pain, constipation, diarrhea, nausea and vomiting.  Genitourinary: Negative for dysuria, frequency and hematuria.  Musculoskeletal: Negative for back pain, joint pain and neck pain.  Skin:       No acne, rash, or lesions  Neurological: Negative for dizziness, tremors, focal weakness and weakness.  Endo/Heme/Allergies: Negative for polydipsia. Does not bruise/bleed easily.  Psychiatric/Behavioral: Negative for depression. The patient is not nervous/anxious and does not have insomnia.      VITAL SIGNS:   Vitals:   08/30/18 1955 08/30/18 2108 08/30/18 2230 08/30/18 2311  BP:  (!) 154/91 (!) 154/86 137/87  Pulse:  94 (!) 118 (!) 101  Resp:    (!) 23  Temp: 98.2 F (36.8 C)     TempSrc: Oral     SpO2:  99% 91% 90%  Weight:      Height:       Wt Readings from Last 3 Encounters:  08/30/18 (!) 141.1 kg  06/15/18 135.2 kg  02/09/18 133.4 kg    PHYSICAL EXAMINATION:  Physical Exam  Vitals reviewed. Constitutional: She is oriented to person, place, and time. She  appears well-developed and well-nourished. No distress.  HENT:  Head: Normocephalic and atraumatic.  Mouth/Throat: Oropharynx is clear and moist.  Eyes: Pupils are equal, round, and reactive to light. Conjunctivae and EOM are normal. No scleral icterus.  Neck: Normal range of motion. Neck supple. No JVD present. No thyromegaly present.  Cardiovascular: Regular rhythm and intact distal pulses. Exam reveals no gallop and no friction rub.  No murmur heard. Borderline tachycardic  Respiratory: Effort normal. No respiratory distress. She has wheezes. She has no rales.  GI: Soft. Bowel sounds are normal. She exhibits no distension. There is no abdominal tenderness.  Musculoskeletal: Normal range of motion.        General: No edema.     Comments: No arthritis, no gout  Lymphadenopathy:    She has no cervical adenopathy.  Neurological: She is alert and oriented to person, place, and time. No cranial nerve deficit.  No dysarthria, no aphasia  Skin: Skin is warm and dry. No rash noted. No erythema.  Psychiatric: She has a normal mood and affect. Her behavior is normal. Judgment and thought content normal.    LABORATORY PANEL:   CBC Recent Labs  Lab 08/30/18 1955  WBC 13.0*  HGB 15.1*  HCT 46.0  PLT 300   ------------------------------------------------------------------------------------------------------------------  Chemistries  Recent Labs  Lab 08/30/18 1955  NA 140  K 3.7  CL 105  CO2 25  GLUCOSE 111*  BUN 19  CREATININE 0.86  CALCIUM 9.4  AST 25  ALT 36  ALKPHOS 75  BILITOT 0.3   ------------------------------------------------------------------------------------------------------------------  Cardiac Enzymes No results for input(s): TROPONINI in the last 168 hours. ------------------------------------------------------------------------------------------------------------------  RADIOLOGY:  Dg Chest 2 View  Result Date: 08/30/2018 CLINICAL DATA:  Shortness of  breath EXAM: CHEST - 2 VIEW COMPARISON:  X-ray from 08/31/2017 could not be retrieved for comparison. FINDINGS: The heart size is mildly enlarged. There are bibasilar airspace opacities. No pneumothorax. There appear to be trace bilateral  pleural effusions. There is no acute osseous abnormality. The lungs appear somewhat hyperexpanded. The patient is status post prior ACDF of the lower cervical spine. IMPRESSION: 1. Streaky bibasilar airspace opacities which are nonspecific but can be seen in patients with volume overload/pulmonary edema or an atypical infectious process. Atelectasis can have a similar appearance. 2. The lungs are somewhat hyperexpanded which can be seen in patients with COPD. Electronically Signed   By: Constance Holster M.D.   On: 08/30/2018 20:12    EKG:   Orders placed or performed during the hospital encounter of 08/30/18  . EKG 12-Lead  . EKG 12-Lead  . ED EKG  . ED EKG    IMPRESSION AND PLAN:  Principal Problem:   Sepsis ([place]) -blood pressure stable, lactic acid pending, sepsis is due to pneumonia, IV antibiotics given, blood cultures ordered Active Problems:   CAP (community acquired pneumonia) -IV antibiotics, PRN duo nebs, IV Solu-Medrol as below   COPD (chronic obstructive pulmonary disease) ([place]) -continue home meds, as needed treatment as above, IV Solu-Medrol   Seizure disorder ([place]) -home dose antiepileptic   Schizophrenia ([place]) -continue home meds   Restless legs syndrome -continue home meds  Chart review performed and case discussed with ED provider. Labs, imaging and/or ECG reviewed by provider and discussed with patient/family. Management plans discussed with the patient and/or family.  COVID-19 status: Tested negative     DVT PROPHYLAXIS: SubQ lovenox   GI PROPHYLAXIS:  None  ADMISSION STATUS: Inpatient     CODE STATUS: Full  TOTAL TIME TAKING CARE OF THIS PATIENT: 45 minutes.   This patient was evaluated in the context of the global  COVID-19 pandemic, which necessitated consideration that the patient might be at risk for infection with the SARS-CoV-2 virus that causes COVID-19. Institutional protocols and algorithms that pertain to the evaluation of patients at risk for COVID-19 are in a state of rapid change based on information released by regulatory bodies including the CDC and federal and state organizations. These policies and algorithms were followed to the best of this provider's knowledge to date during the patient's care at this facility.  Ethlyn Daniels 08/30/2018, 11:34 PM  [facility]  Office  [phone]  CC: Primary care physician; Laneta Simmers, NP  Note:  This document was prepared using Dragon voice recognition software and may include unintentional dictation errors.

## 2018-08-30 NOTE — ED Notes (Signed)
Resumed care dee rn  Pt alert and sitting on stretcher.  Iv in place.  Oxygen in place.  Pt waiting on admission. nsr on monitor.

## 2018-08-31 ENCOUNTER — Other Ambulatory Visit: Payer: Self-pay

## 2018-08-31 DIAGNOSIS — R0602 Shortness of breath: Secondary | ICD-10-CM | POA: Diagnosis not present

## 2018-08-31 DIAGNOSIS — A419 Sepsis, unspecified organism: Secondary | ICD-10-CM | POA: Diagnosis not present

## 2018-08-31 LAB — CBC
HCT: 45.9 % (ref 36.0–46.0)
Hemoglobin: 15 g/dL (ref 12.0–15.0)
MCH: 32.1 pg (ref 26.0–34.0)
MCHC: 32.7 g/dL (ref 30.0–36.0)
MCV: 98.1 fL (ref 80.0–100.0)
Platelets: 267 10*3/uL (ref 150–400)
RBC: 4.68 MIL/uL (ref 3.87–5.11)
RDW: 14.4 % (ref 11.5–15.5)
WBC: 13.7 10*3/uL — ABNORMAL HIGH (ref 4.0–10.5)
nRBC: 0 % (ref 0.0–0.2)

## 2018-08-31 LAB — BASIC METABOLIC PANEL
Anion gap: 12 (ref 5–15)
BUN: 21 mg/dL — ABNORMAL HIGH (ref 6–20)
CO2: 24 mmol/L (ref 22–32)
Calcium: 9.3 mg/dL (ref 8.9–10.3)
Chloride: 106 mmol/L (ref 98–111)
Creatinine, Ser: 0.71 mg/dL (ref 0.44–1.00)
GFR calc Af Amer: >60 mL/min (ref >60)
GFR calc non Af Amer: >60 mL/min (ref >60)
Glucose, Bld: 141 mg/dL — ABNORMAL HIGH (ref 70–99)
Potassium: 4.1 mmol/L (ref 3.5–5.1)
Sodium: 142 mmol/L (ref 135–145)

## 2018-08-31 LAB — LACTIC ACID, PLASMA: Lactic Acid, Venous: 1.6 mmol/L (ref 0.5–1.9)

## 2018-08-31 MED ORDER — TIOTROPIUM BROMIDE MONOHYDRATE 18 MCG IN CAPS
18.0000 ug | ORAL_CAPSULE | Freq: Every day | RESPIRATORY_TRACT | Status: DC
  Filled 2018-08-31: qty 5

## 2018-08-31 MED ORDER — AZELASTINE HCL 0.1 % NA SOLN
1.0000 | Freq: Two times a day (BID) | NASAL | Status: DC
  Filled 2018-08-31: qty 30

## 2018-08-31 MED ORDER — DOCUSATE SODIUM 100 MG PO CAPS: 100.0000 mg | ORAL_CAPSULE | Freq: Two times a day (BID) | ORAL | Status: DC | PRN

## 2018-08-31 MED ORDER — ENOXAPARIN SODIUM 40 MG/0.4ML ~~LOC~~ SOLN
40.0000 mg | Freq: Two times a day (BID) | SUBCUTANEOUS | Status: DC
  Administered 2018-08-31: 40 mg via SUBCUTANEOUS
  Filled 2018-08-31: qty 0.4

## 2018-08-31 MED ORDER — NALOXONE HCL 4 MG/0.1ML NA LIQD
1.0000 | Freq: Every day | NASAL | Status: DC | PRN
  Filled 2018-08-31: qty 8

## 2018-08-31 MED ORDER — OXYCODONE HCL 5 MG PO TABS
10.0000 mg | ORAL_TABLET | Freq: Four times a day (QID) | ORAL | Status: DC | PRN
  Administered 2018-08-31: 05:00:00 10 mg via ORAL
  Filled 2018-08-31: qty 2

## 2018-08-31 MED ORDER — ONDANSETRON HCL 4 MG/2ML IJ SOLN: 4.0000 mg | Freq: Four times a day (QID) | INTRAMUSCULAR | Status: DC | PRN

## 2018-08-31 MED ORDER — FLUTICASONE PROPIONATE 50 MCG/ACT NA SUSP
1.0000 | Freq: Two times a day (BID) | NASAL | Status: DC
  Filled 2018-08-31: qty 16

## 2018-08-31 MED ORDER — TRAZODONE HCL 50 MG PO TABS: 50.0000 mg | ORAL_TABLET | Freq: Every evening | ORAL | Status: DC | PRN

## 2018-08-31 MED ORDER — ONDANSETRON HCL 4 MG PO TABS: 4.0000 mg | ORAL_TABLET | Freq: Four times a day (QID) | ORAL | Status: DC | PRN

## 2018-08-31 MED ORDER — ACETAMINOPHEN 325 MG PO TABS: 650.0000 mg | ORAL_TABLET | Freq: Four times a day (QID) | ORAL | Status: DC | PRN

## 2018-08-31 MED ORDER — METHOCARBAMOL 500 MG PO TABS
500.0000 mg | ORAL_TABLET | Freq: Four times a day (QID) | ORAL | Status: DC | PRN
  Filled 2018-08-31: qty 2

## 2018-08-31 MED ORDER — LORAZEPAM 0.5 MG PO TABS
0.5000 mg | ORAL_TABLET | ORAL | Status: DC | PRN
  Filled 2018-08-31: qty 1

## 2018-08-31 MED ORDER — OMEGA-3-ACID ETHYL ESTERS 1 G PO CAPS: 2.0000 g | ORAL_CAPSULE | Freq: Every day | ORAL | Status: DC

## 2018-08-31 MED ORDER — SODIUM CHLORIDE 0.9 % IV SOLN: INTRAVENOUS | Status: DC | PRN

## 2018-08-31 MED ORDER — PERPHENAZINE 4 MG PO TABS
16.0000 mg | ORAL_TABLET | Freq: Two times a day (BID) | ORAL | Status: DC
  Administered 2018-08-31: 11:00:00 16 mg via ORAL
  Filled 2018-08-31 (×2): qty 4

## 2018-08-31 MED ORDER — TIZANIDINE HCL 2 MG PO TABS
2.0000 mg | ORAL_TABLET | Freq: Three times a day (TID) | ORAL | Status: DC | PRN
  Filled 2018-08-31: qty 1

## 2018-08-31 MED ORDER — ACETAMINOPHEN 650 MG RE SUPP: 650.0000 mg | Freq: Four times a day (QID) | RECTAL | Status: DC | PRN

## 2018-08-31 MED ORDER — ADULT MULTIVITAMIN W/MINERALS CH: 1.0000 | ORAL_TABLET | Freq: Every day | ORAL | Status: DC

## 2018-08-31 MED ORDER — METHYLPREDNISOLONE SODIUM SUCC 125 MG IJ SOLR
60.0000 mg | Freq: Four times a day (QID) | INTRAMUSCULAR | Status: DC
  Administered 2018-08-31 (×2): 60 mg via INTRAVENOUS
  Filled 2018-08-31 (×2): qty 2

## 2018-08-31 MED ORDER — ROPINIROLE HCL 1 MG PO TABS: 0.5000 mg | ORAL_TABLET | Freq: Every day | ORAL | Status: DC

## 2018-08-31 MED ORDER — PERPHENAZINE 4 MG PO TABS: 16.0000 mg | ORAL_TABLET | Freq: Two times a day (BID) | ORAL | Status: DC

## 2018-08-31 MED ORDER — METHOCARBAMOL 500 MG PO TABS
1000.0000 mg | ORAL_TABLET | Freq: Four times a day (QID) | ORAL | Status: DC | PRN
  Administered 2018-08-31: 11:00:00 1000 mg via ORAL
  Filled 2018-08-31 (×2): qty 2

## 2018-08-31 MED ORDER — IPRATROPIUM-ALBUTEROL 0.5-2.5 (3) MG/3ML IN SOLN
3.0000 mL | RESPIRATORY_TRACT | Status: DC | PRN
  Administered 2018-08-31: 08:00:00 3 mL via RESPIRATORY_TRACT
  Filled 2018-08-31: qty 3

## 2018-08-31 NOTE — ED Notes (Signed)
ED Provider at bedside. 

## 2018-08-31 NOTE — ED Notes (Signed)
Report called to debbie rn floor nurse 

## 2018-08-31 NOTE — ED Notes (Signed)
ED TO INPATIENT HANDOFF REPORT  ED Nurse Name and Phone #: Nafis Farnan   S Name/Age/Gender Brandi Fowler 53 y.o. female Room/Bed: ED19A/ED19A  Code Status   Code Status: Not on file  Home/SNF/Other Home Patient oriented to: self, place, time and situation Is this baseline? Yes   Triage Complete: Triage complete  Chief Complaint Sob  Triage Note Pt presents to ED with sob that increases with exertion. Denies chest pain. Increased work of breathing noted at this time. Symptoms started Friday.    Allergies Allergies  Allergen Reactions  . Abilify [Aripiprazole] Swelling  . Baclofen Swelling  . Bromfenac   . Buprenorphine   . Buprenorphine-Naloxone Other (See Comments)  . Bupropion   . Ciprofloxacin   . Ciprofloxacin Hcl Other (See Comments)  . Clindamycin/Lincomycin   . Clonazepam   . Codeine   . Colestipol   . Doxepin   . Etodolac   . Fentanyl   . Flexeril [Cyclobenzaprine]   . Flurbiprofen   . Gabapentin   . Haloperidol     Other reaction(s): Headache  . Hydrocodone   . Ibuprofen Other (See Comments)    Bleeding in stomach  . Indocin [Indomethacin]   . Ketoprofen   . Latuda [Lurasidone Hcl]   . Lurasidone Swelling  . Lyrica [Pregabalin]   . Meloxicam   . Methadone   . Metronidazole   . Mirtazapine   . Nabumetone   . Naproxen   . Opana [Oxymorphone Hcl] Other (See Comments)    seizures  . Oxaprozin   . Oxycodone   . Oxymorphone Other (See Comments)  . Paroxetine   . Penicillins Swelling  . Quetiapine Other (See Comments)  . Seroquel [Quetiapine Fumarate]   . Sertraline Diarrhea  . Suboxone [Buprenorphine Hcl-Naloxone Hcl] Other (See Comments)    seizures  . Tolmetin   . Tramadol   . Trazodone Itching  . Tylenol [Acetaminophen]   . Valproic Acid Diarrhea  . Zoloft [Sertraline Hcl] Nausea And Vomiting  . Clindamycin Rash    Level of Care/Admitting Diagnosis ED Disposition    ED Disposition Condition [facility] Area:  [place] [[id number]]  Level of Care: Med-Surg [16]  Covid Evaluation: Confirmed COVID Negative  Diagnosis: CAP (community acquired pneumonia) [532992]  Admitting Physician: Lance Coon [[id number]]  Attending Physician: Lance Coon [phone]  Estimated length of stay: past midnight tomorrow  Certification:: I certify this patient will need inpatient services for at least 2 midnights  PT Class (Do Not Modify): Inpatient [101]  PT Acc Code (Do Not Modify): Private [1]       B Medical/Surgery History Past Medical History:  Diagnosis Date  . BRBPR (bright red blood per rectum)   . Chronic diarrhea   . Chronic kidney disease   . Chronic myofascial pain   . Chronic pain   . COPD (chronic obstructive pulmonary disease) ([place])   . DDD (degenerative disc disease), cervical   . DDD (degenerative disc disease), cervical   . Depression   . GERD (gastroesophageal reflux disease)   . Headache    migraines  . Kidney stones   . Obesity, Class III, BMI 40-49.9 (morbid obesity) ([place])   . Schizophrenia ([place])   . Seizures ([place])    Past Surgical History:  Procedure Laterality Date  . CHOLECYSTECTOMY    . COLONOSCOPY WITH PROPOFOL N/A 11/18/2014   Procedure: COLONOSCOPY WITH PROPOFOL;  Surgeon: Manya Silvas, MD;  Location: [facility] ENDOSCOPY;  Service: Endoscopy;  Laterality: N/A;  . ESOPHAGOGASTRODUODENOSCOPY    . KNEE SURGERY Left   . polpectomy N/A   . SPINE SURGERY    . TUBAL LIGATION       A IV Location/Drains/Wounds Patient Lines/Drains/Airways Status   Active Line/Drains/Airways    Name:   Placement date:   Placement time:   Site:   Days:   Peripheral IV 08/30/18 Left Forearm   08/30/18    1954    Forearm   1          Intake/Output Last 24 hours No intake or output data in the 24 hours ending 08/31/18 0049  Labs/Imaging Results for orders placed or performed during the hospital encounter of 08/30/18 (from the past 48 hour(s))  Brain natriuretic  peptide     Status: None   Collection Time: 08/30/18  7:37 PM  Result Value Ref Range   B Natriuretic Peptide 58.0 0.0 - 100.0 pg/mL    Comment: Performed at [facility], [address] [place], [place] [address]  CBC with Differential     Status: Abnormal   Collection Time: 08/30/18  7:55 PM  Result Value Ref Range   WBC 13.0 (H) 4.0 - 10.5 K/uL   RBC 4.74 3.87 - 5.11 MIL/uL   Hemoglobin 15.1 (H) 12.0 - 15.0 g/dL   HCT 46.0 36.0 - 46.0 %   MCV 97.0 80.0 - 100.0 fL   MCH 31.9 26.0 - 34.0 pg   MCHC 32.8 30.0 - 36.0 g/dL   RDW 14.3 11.5 - 15.5 %   Platelets 300 150 - 400 K/uL   nRBC 0.0 0.0 - 0.2 %   Neutrophils Relative % 72 %   Neutro Abs 9.5 (H) 1.7 - 7.7 K/uL   Lymphocytes Relative 18 %   Lymphs Abs 2.4 0.7 - 4.0 K/uL   Monocytes Relative 6 %   Monocytes Absolute 0.8 0.1 - 1.0 K/uL   Eosinophils Relative 2 %   Eosinophils Absolute 0.2 0.0 - 0.5 K/uL   Basophils Relative 1 %   Basophils Absolute 0.1 0.0 - 0.1 K/uL   Immature Granulocytes 1 %   Abs Immature Granulocytes 0.10 (H) 0.00 - 0.07 K/uL    Comment: Performed at [facility], [address] [place], [place] [address]  Comprehensive metabolic panel     Status: Abnormal   Collection Time: 08/30/18  7:55 PM  Result Value Ref Range   Sodium 140 135 - 145 mmol/L   Potassium 3.7 3.5 - 5.1 mmol/L   Chloride 105 98 - 111 mmol/L   CO2 25 22 - 32 mmol/L   Glucose, Bld 111 (H) 70 - 99 mg/dL   BUN 19 6 - 20 mg/dL   Creatinine, Ser 0.86 0.44 - 1.00 mg/dL   Calcium 9.4 8.9 - 10.3 mg/dL   Total Protein 7.5 6.5 - 8.1 g/dL   Albumin 4.0 3.5 - 5.0 g/dL   AST 25 15 - 41 U/L   ALT 36 0 - 44 U/L   Alkaline Phosphatase 75 38 - 126 U/L   Total Bilirubin 0.3 0.3 - 1.2 mg/dL   GFR calc non Af Amer >60 >60 mL/min   GFR calc Af Amer >60 >60 mL/min   Anion gap 10 5 - 15    Comment: Performed at [facility], [address], [place], [place] [address]  SARS Coronavirus 2 (CEPHEID- Performed in [place] hospital lab), Hosp Order     Status: None   Collection Time:  08/30/18  7:55 PM   Specimen: Nasopharyngeal Swab  Result Value Ref Range   SARS Coronavirus 2 NEGATIVE NEGATIVE    Comment: (NOTE) If result is NEGATIVE SARS-CoV-2 target nucleic acids are NOT DETECTED. The SARS-CoV-2 RNA is generally detectable in upper and lower  respiratory specimens during the acute phase of infection. The lowest  concentration of SARS-CoV-2 viral copies this assay can detect is 250  copies / mL. A negative result does not preclude SARS-CoV-2 infection  and should not be used as the sole basis for treatment or other  patient management decisions.  A negative result may occur with  improper specimen collection / handling, submission of specimen other  than nasopharyngeal swab, presence of viral mutation(s) within the  areas targeted by this assay, and inadequate number of viral copies  (<250 copies / mL). A negative result must be combined with clinical  observations, patient history, and epidemiological information. If result is POSITIVE SARS-CoV-2 target nucleic acids are DETECTED. The SARS-CoV-2 RNA is generally detectable in upper and lower  respiratory specimens dur ing the acute phase of infection.  Positive  results are indicative of active infection with SARS-CoV-2.  Clinical  correlation with patient history and other diagnostic information is  necessary to determine patient infection status.  Positive results do  not rule out bacterial infection or co-infection with other viruses. If result is PRESUMPTIVE POSTIVE SARS-CoV-2 nucleic acids MAY BE PRESENT.   A presumptive positive result was obtained on the submitted specimen  and confirmed on repeat testing.  While 2019 novel coronavirus  (SARS-CoV-2) nucleic acids may be present in the submitted sample  additional confirmatory testing may be necessary for epidemiological  and / or clinical management purposes  to differentiate between   SARS-CoV-2 and other Sarbecovirus currently known to infect humans.  If clinically indicated additional testing with an alternate test  methodology [phone]) is advised. The SARS-CoV-2 RNA is generally  detectable in upper and lower respiratory sp ecimens during the acute  phase of infection. The expected result is Negative. Fact Sheet for Patients:  StrictlyIdeas.no Fact Sheet for Healthcare Providers: BankingDealers.co.za This test is not yet approved or cleared by the [place] FDA and has been authorized for detection and/or diagnosis of SARS-CoV-2 by FDA under an Emergency Use Authorization (EUA).  This EUA will remain in effect (meaning this test can be used) for the duration of the COVID-19 declaration under Section 564(b)(1) of the Act, 21 U.S.C. section 360bbb-3(b)(1), unless the authorization is terminated or revoked sooner. Performed at [facility], [address] [place],  [address]    Dg Chest 2 View  Result Date: 08/30/2018 CLINICAL DATA:  Shortness of breath EXAM: CHEST - 2 VIEW COMPARISON:  X-ray from 08/31/2017 could not be retrieved for comparison. FINDINGS: The heart size is mildly enlarged. There are bibasilar airspace opacities. No pneumothorax. There appear to be trace bilateral pleural effusions. There is no acute osseous abnormality. The lungs appear somewhat hyperexpanded. The patient is status post prior ACDF of the lower cervical spine. IMPRESSION: 1. Streaky bibasilar airspace opacities which are nonspecific but can be seen in patients with volume overload/pulmonary edema or an atypical infectious process. Atelectasis can have a similar appearance. 2. The lungs are somewhat hyperexpanded which can be seen in patients with COPD. Electronically Signed   By: Constance Holster M.D.   On: 08/30/2018 20:12    Pending Labs [employer] (From admission, onward)    Start     Ordered  08/31/18  0034  Culture, blood (Routine X 2) w Reflex to ID Panel  BLOOD CULTURE X 2,   STAT     08/31/18 0033   08/31/18 0033  Lactic acid, plasma  ONCE - STAT,   STAT     08/31/18 0032   Signed and Held  HIV antibody (Routine Testing)  Once,   R     Signed and Held   Signed and Held  CBC  (enoxaparin (LOVENOX)    CrCl >/= 30 ml/min)  Once,   R    Comments: Baseline for enoxaparin therapy IF NOT ALREADY DRAWN.  Notify MD if PLT < 100 K.    Signed and Held   Signed and Held  Creatinine, serum  (enoxaparin (LOVENOX)    CrCl >/= 30 ml/min)  Once,   R    Comments: Baseline for enoxaparin therapy IF NOT ALREADY DRAWN.    Signed and Held   Signed and Held  Creatinine, serum  (enoxaparin (LOVENOX)    CrCl >/= 30 ml/min)  Weekly,   R    Comments: while on enoxaparin therapy    Signed and Held   Signed and Held  Basic metabolic panel  Tomorrow morning,   R     Signed and Held   Signed and Held  CBC  Tomorrow morning,   R     Signed and Held          Vitals/Pain Today's Vitals   08/30/18 2230 08/30/18 2311 08/30/18 2320 08/30/18 2340  BP: (!) 154/86 137/87 (!) 149/90 130/79  Pulse: (!) 118 (!) 101 (!) 113 95  Resp:  (!) 23 (!) 28 (!) 28  Temp:      TempSrc:      SpO2: 91% 90% 91% 90%  Weight:      Height:      PainSc:        Isolation Precautions No active isolations  Medications Medications  cefTRIAXone (ROCEPHIN) 2 g in sodium chloride 0.9 % 100 mL IVPB (0 g Intravenous Stopped 08/30/18 2124)  azithromycin (ZITHROMAX) 500 mg in sodium chloride 0.9 % 250 mL IVPB (0 mg Intravenous Stopped 08/30/18 2306)  methylPREDNISolone sodium succinate (SOLU-MEDROL) 125 mg/2 mL injection 60 mg (has no administration in time range)  methylPREDNISolone sodium succinate (SOLU-MEDROL) 125 mg/2 mL injection 125 mg (125 mg Intravenous Given 08/30/18 2234)  ipratropium-albuterol (DUONEB) 0.5-2.5 (3) MG/3ML nebulizer solution 3 mL (3 mLs Nebulization Given 08/30/18 2253)  albuterol (PROVENTIL) (2.5 MG/3ML)  0.083% nebulizer solution 5 mg (5 mg Nebulization Given 08/30/18 2232)    Mobility walks Low fall risk   Focused Assessments Cardiac Assessment Handoff:    No results found for: CKTOTAL, CKMB, CKMBINDEX, TROPONINI No results found for: DDIMER Does the Patient currently have chest pain? No      R Recommendations: See Admitting Provider Note  Report given to:   Additional Notes: none

## 2018-09-01 LAB — HIV ANTIBODY (ROUTINE TESTING W REFLEX): HIV Screen 4th Generation wRfx: NONREACTIVE

## 2018-09-01 NOTE — Discharge Summary (Signed)
Pt was admitted with Pneumonia, Sepsis She decided to leave against medical advise. Further details- please see progress note from same day.

## 2018-09-01 NOTE — Progress Notes (Signed)
[place] at [place] NAME: Brandi Fowler    MR#:  [id number]  DATE OF BIRTH:  [birth year]  SUBJECTIVE:  CHIEF COMPLAINT:   Chief Complaint  Patient presents with  . Shortness of Breath   Came with SOB and cough- felt slightly better. Still was on oxygen during my visit.  REVIEW OF SYSTEMS:  CONSTITUTIONAL: No fever, fatigue or weakness.  EYES: No blurred or double vision.  EARS, NOSE, AND THROAT: No tinnitus or ear pain.  RESPIRATORY: No cough, shortness of breath, wheezing or hemoptysis.  CARDIOVASCULAR: No chest pain, orthopnea, edema.  GASTROINTESTINAL: No nausea, vomiting, diarrhea or abdominal pain.  GENITOURINARY: No dysuria, hematuria.  ENDOCRINE: No polyuria, nocturia,  HEMATOLOGY: No anemia, easy bruising or bleeding SKIN: No rash or lesion. MUSCULOSKELETAL: No joint pain or arthritis.   NEUROLOGIC: No tingling, numbness, weakness.  PSYCHIATRY: No anxiety or depression.   ROS  DRUG ALLERGIES:   Allergies  Allergen Reactions  . Abilify [Aripiprazole] Swelling  . Baclofen Swelling  . Bromfenac   . Buprenorphine   . Buprenorphine-Naloxone Other (See Comments)  . Bupropion   . Ciprofloxacin   . Ciprofloxacin Hcl Other (See Comments)  . Clindamycin/Lincomycin   . Clonazepam   . Codeine   . Colestipol   . Doxepin   . Etodolac   . Fentanyl   . Flexeril [Cyclobenzaprine]   . Flurbiprofen   . Gabapentin   . Haloperidol     Other reaction(s): Headache  . Hydrocodone   . Ibuprofen Other (See Comments)    Bleeding in stomach  . Indocin [Indomethacin]   . Ketoprofen   . Latuda [Lurasidone Hcl]   . Lurasidone Swelling  . Lyrica [Pregabalin]   . Meloxicam   . Methadone   . Metronidazole   . Mirtazapine   . Nabumetone   . Naproxen   . Opana [Oxymorphone Hcl] Other (See Comments)    seizures  . Oxaprozin   . Oxycodone   . Oxymorphone Other (See Comments)  . Paroxetine   . Penicillins Swelling  . Quetiapine  Other (See Comments)  . Seroquel [Quetiapine Fumarate]   . Sertraline Diarrhea  . Suboxone [Buprenorphine Hcl-Naloxone Hcl] Other (See Comments)    seizures  . Tolmetin   . Tramadol   . Trazodone Itching  . Tylenol [Acetaminophen] Nausea And Vomiting  . Valproic Acid Diarrhea  . Zoloft [Sertraline Hcl] Nausea And Vomiting  . Clindamycin Rash    VITALS:  Blood pressure 133/72, pulse 95, temperature (!) 97.5 F (36.4 C), temperature source Oral, resp. rate 19, height 5\' 5"  (1.651 m), weight (!) 142.2 kg, last menstrual period 08/13/2014, SpO2 92 %.  PHYSICAL EXAMINATION:  GENERAL:  53 y.o.-year-old patient lying in the bed with no acute distress.  EYES: Pupils equal, round, reactive to light and accommodation. No scleral icterus. Extraocular muscles intact.  HEENT: Head atraumatic, normocephalic. Oropharynx and nasopharynx clear.  NECK:  Supple, no jugular venous distention. No thyroid enlargement, no tenderness.  LUNGS: Normal breath sounds bilaterally, some wheezing, rales,rhonchi or crepitation. No use of accessory muscles of respiration.  CARDIOVASCULAR: S1, S2 normal. No murmurs, rubs, or gallops.  ABDOMEN: Soft, nontender, nondistended. Bowel sounds present. No organomegaly or mass.  EXTREMITIES: No pedal edema, cyanosis, or clubbing.  NEUROLOGIC: Cranial nerves II through XII are intact. Muscle strength 5/5 in all extremities. Sensation intact. Gait not checked.  PSYCHIATRIC: The patient is alert and oriented x 3.  SKIN: No obvious rash, lesion, or  ulcer.   Physical Exam LABORATORY PANEL:   CBC Recent Labs  Lab 08/31/18 0153  WBC 13.7*  HGB 15.0  HCT 45.9  PLT 267   ------------------------------------------------------------------------------------------------------------------  Chemistries  Recent Labs  Lab 08/30/18 1955 08/31/18 0153  NA 140 142  K 3.7 4.1  CL 105 106  CO2 25 24  GLUCOSE 111* 141*  BUN 19 21*  CREATININE 0.86 0.71  CALCIUM 9.4 9.3   AST 25  --   ALT 36  --   ALKPHOS 75  --   BILITOT 0.3  --    ------------------------------------------------------------------------------------------------------------------  Cardiac Enzymes No results for input(s): TROPONINI in the last 168 hours. ------------------------------------------------------------------------------------------------------------------  RADIOLOGY:  Dg Chest 2 View  Result Date: 08/30/2018 CLINICAL DATA:  Shortness of breath EXAM: CHEST - 2 VIEW COMPARISON:  X-ray from 08/31/2017 could not be retrieved for comparison. FINDINGS: The heart size is mildly enlarged. There are bibasilar airspace opacities. No pneumothorax. There appear to be trace bilateral pleural effusions. There is no acute osseous abnormality. The lungs appear somewhat hyperexpanded. The patient is status post prior ACDF of the lower cervical spine. IMPRESSION: 1. Streaky bibasilar airspace opacities which are nonspecific but can be seen in patients with volume overload/pulmonary edema or an atypical infectious process. Atelectasis can have a similar appearance. 2. The lungs are somewhat hyperexpanded which can be seen in patients with COPD. Electronically Signed   By: Constance Holster M.D.   On: 08/30/2018 20:12    ASSESSMENT AND PLAN:   Principal Problem:   Sepsis ([place]) Active Problems:   COPD (chronic obstructive pulmonary disease) (HCC)   Restless legs syndrome   CAP (community acquired pneumonia)   Seizure disorder (HCC)   Schizophrenia (HCC)   GERD (gastroesophageal reflux disease)    Sepsis ([place]) -blood pressure stable, lactic acid pending, sepsis is due to pneumonia, IV antibiotics given, blood cultures ordered    CAP (community acquired pneumonia) -IV antibiotics, PRN duo nebs, IV Solu-Medrol as below   COPD (chronic obstructive pulmonary disease) ([place]) -continue home meds, as needed treatment as above, IV Solu-Medrol   Seizure disorder ([place]) -home dose antiepileptic    Schizophrenia ([place]) -continue home meds   Restless legs syndrome -continue home meds  All the records are reviewed and case discussed with Care Management/Social Workerr. Management plans discussed with the patient, family and they are in agreement.  CODE STATUS:full.  TOTAL TIME TAKING CARE OF THIS PATIENT: 35 minutes.    POSSIBLE D/C IN 1-2 DAYS, DEPENDING ON CLINICAL CONDITION.   Vaughan Basta M.D on 09/01/2018   Between 7am to 6pm - Pager - [phone]  After 6pm go to [email or web address] - password [email or web address] [facility]  Office  [phone]  CC: Primary care physician; Laneta Simmers, NP  Note: This dictation was prepared with Dragon dictation along with smaller phrase technology. Any transcriptional errors that result from this process are unintentional.

## 2018-09-05 LAB — CULTURE, BLOOD (ROUTINE X 2)
Culture: NO GROWTH
Culture: NO GROWTH
Special Requests: ADEQUATE
Special Requests: ADEQUATE

## 2018-12-21 ENCOUNTER — Ambulatory Visit (INDEPENDENT_AMBULATORY_CARE_PROVIDER_SITE_OTHER): Payer: Medicare HMO | Admitting: Vascular Surgery

## 2018-12-21 ENCOUNTER — Encounter (INDEPENDENT_AMBULATORY_CARE_PROVIDER_SITE_OTHER): Payer: Self-pay

## 2019-02-18 ENCOUNTER — Other Ambulatory Visit: Payer: Self-pay

## 2019-02-18 ENCOUNTER — Emergency Department
Admission: EM | Admit: 2019-02-18 | Discharge: 2019-02-18 | Disposition: A | Discharge status: Home / Self Care | Payer: Medicare HMO | Attending: Emergency Medicine | Admitting: Emergency Medicine

## 2019-02-18 ENCOUNTER — Encounter: Payer: Self-pay | Admitting: Emergency Medicine

## 2019-02-18 DIAGNOSIS — Z79899 Other long term (current) drug therapy: Secondary | ICD-10-CM | POA: Insufficient documentation

## 2019-02-18 DIAGNOSIS — N189 Chronic kidney disease, unspecified: Secondary | ICD-10-CM | POA: Diagnosis not present

## 2019-02-18 DIAGNOSIS — L509 Urticaria, unspecified: Secondary | ICD-10-CM | POA: Diagnosis not present

## 2019-02-18 DIAGNOSIS — J449 Chronic obstructive pulmonary disease, unspecified: Secondary | ICD-10-CM | POA: Diagnosis not present

## 2019-02-18 DIAGNOSIS — F17210 Nicotine dependence, cigarettes, uncomplicated: Secondary | ICD-10-CM | POA: Insufficient documentation

## 2019-02-18 DIAGNOSIS — R21 Rash and other nonspecific skin eruption: Secondary | ICD-10-CM | POA: Diagnosis present

## 2019-02-18 LAB — CBC WITH DIFFERENTIAL/PLATELET
Abs Immature Granulocytes: 0.13 10*3/uL — ABNORMAL HIGH (ref 0.00–0.07)
Basophils Absolute: 0.1 10*3/uL (ref 0.0–0.1)
Basophils Relative: 1 %
Eosinophils Absolute: 0.8 10*3/uL — ABNORMAL HIGH (ref 0.0–0.5)
Eosinophils Relative: 8 %
HCT: 43.1 % (ref 36.0–46.0)
Hemoglobin: 15.5 g/dL — ABNORMAL HIGH (ref 12.0–15.0)
Immature Granulocytes: 1 %
Lymphocytes Relative: 21 %
Lymphs Abs: 2.2 10*3/uL (ref 0.7–4.0)
MCH: 32.3 pg (ref 26.0–34.0)
MCHC: 36 g/dL (ref 30.0–36.0)
MCV: 89.8 fL (ref 80.0–100.0)
Monocytes Absolute: 0.7 10*3/uL (ref 0.1–1.0)
Monocytes Relative: 7 %
Neutro Abs: 6.4 10*3/uL (ref 1.7–7.7)
Neutrophils Relative %: 62 %
Platelets: 203 10*3/uL (ref 150–400)
RBC: 4.8 MIL/uL (ref 3.87–5.11)
RDW: 13.1 % (ref 11.5–15.5)
WBC Morphology: INCREASED
WBC: 10.5 10*3/uL (ref 4.0–10.5)
nRBC: 0 % (ref 0.0–0.2)

## 2019-02-18 LAB — BASIC METABOLIC PANEL
Anion gap: 14 (ref 5–15)
BUN: 11 mg/dL (ref 6–20)
CO2: 21 mmol/L — ABNORMAL LOW (ref 22–32)
Calcium: 9.3 mg/dL (ref 8.9–10.3)
Chloride: 101 mmol/L (ref 98–111)
Creatinine, Ser: 0.69 mg/dL (ref 0.44–1.00)
GFR calc Af Amer: >60 mL/min (ref >60)
GFR calc non Af Amer: >60 mL/min (ref >60)
Glucose, Bld: 109 mg/dL — ABNORMAL HIGH (ref 70–99)
Potassium: 4.1 mmol/L (ref 3.5–5.1)
Sodium: 136 mmol/L (ref 135–145)

## 2019-02-18 LAB — SEDIMENTATION RATE: Sed Rate: 9 mm/hr (ref 0–30)

## 2019-02-18 MED ORDER — PREDNISONE 20 MG PO TABS: ORAL_TABLET | ORAL | 0 refills | Status: DC

## 2019-02-18 MED ORDER — FAMOTIDINE IN NACL 20-0.9 MG/50ML-% IV SOLN
20.0000 mg | Freq: Once | INTRAVENOUS | Status: AC
  Administered 2019-02-18: 20 mg via INTRAVENOUS
  Filled 2019-02-18: qty 50

## 2019-02-18 MED ORDER — SODIUM CHLORIDE 0.9 % IV BOLUS
500.0000 mL | Freq: Once | INTRAVENOUS | Status: AC
  Administered 2019-02-18: 500 mL via INTRAVENOUS

## 2019-02-18 MED ORDER — DIPHENHYDRAMINE HCL 50 MG/ML IJ SOLN
50.0000 mg | Freq: Once | INTRAMUSCULAR | Status: AC
  Administered 2019-02-18: 17:00:00 50 mg via INTRAVENOUS
  Filled 2019-02-18: qty 1

## 2019-02-18 MED ORDER — CYPROHEPTADINE HCL 4 MG PO TABS: 4.0000 mg | ORAL_TABLET | Freq: Three times a day (TID) | ORAL | 0 refills | Status: DC | PRN

## 2019-02-18 MED ORDER — METHYLPREDNISOLONE SODIUM SUCC 125 MG IJ SOLR
125.0000 mg | Freq: Once | INTRAMUSCULAR | Status: AC
  Administered 2019-02-18: 125 mg via INTRAVENOUS
  Filled 2019-02-18: qty 2

## 2019-02-18 MED ORDER — FAMOTIDINE 40 MG PO TABS: 40.0000 mg | ORAL_TABLET | Freq: Two times a day (BID) | ORAL | 0 refills | Status: DC

## 2019-02-18 NOTE — ED Notes (Signed)
Lab called to collect SST tube

## 2019-02-18 NOTE — Discharge Instructions (Signed)
Take the prescription meds as directed. Follow-up with your provider for ongoing management. Return to the ED as needed.

## 2019-02-18 NOTE — ED Provider Notes (Signed)
[facility] Emergency Department Provider Note ____________________________________________  Time seen: 1709  I have reviewed the triage vital signs and the nursing notes.  HISTORY  Chief Complaint  Rash  HPI Joeanne Schweigert Solly is a 53 y.o. female history of chronic kidney disease, COPD on O2, GERD and schizophrenia, presents to the ED with a 2-week complaint of persistent, intermittent  generalized rash.  She is unaware of any known contacts, exposures, triggers, or allergens.  She has been treated by her primary provider and is on a short taper of prednisone as well as Benadryl daily, and famotidine at the advice of an retail pharmacist.  She denies any difficulty breathing above baseline.  She denies any swelling around the lips, throat, or tongue.  She presents now due to ongoing itching, requesting relief.  Patient has an extensive medication list and extensive drug allergy list.  She reports discontinuing all of her regular prescription medications, before restarting her pain medicine and her COPD medications.  Past Medical History:  Diagnosis Date  . BRBPR (bright red blood per rectum)   . Chronic diarrhea   . Chronic kidney disease   . Chronic myofascial pain   . Chronic pain   . COPD (chronic obstructive pulmonary disease) ([place])   . DDD (degenerative disc disease), cervical   . DDD (degenerative disc disease), cervical   . Depression   . GERD (gastroesophageal reflux disease)   . Headache    migraines  . Kidney stones   . Obesity, Class III, BMI 40-49.9 (morbid obesity) ([place])   . Schizophrenia ([place])   . Seizures [facility])     Patient Active Problem List   Diagnosis Date Noted  . CAP (community acquired pneumonia) 08/30/2018  . Seizure disorder ([place]) 08/30/2018  . Schizophrenia ([place]) 08/30/2018  . GERD (gastroesophageal reflux disease) 08/30/2018  . Sepsis ([place]) 08/30/2018  . Restless legs syndrome 06/15/2018  . Chronic venous insufficiency  06/15/2018  . Lymphedema 06/15/2018  . Leg pain 02/09/2018  . COPD (chronic obstructive pulmonary disease) ([place]) 02/09/2018  . 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 DDD (degenerative disc disease), lumbar 07/30/2014  . Thoracic facet syndrome 07/30/2014  . Facet syndrome, lumbar 07/30/2014  . 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 CHOLECYSTECTOMY    . COLONOSCOPY WITH PROPOFOL N/A 11/18/2014   Procedure: COLONOSCOPY WITH PROPOFOL;  Surgeon: Manya Silvas, MD;  Location: [facility] ENDOSCOPY;  Service: Endoscopy;  Laterality: N/A;  . ESOPHAGOGASTRODUODENOSCOPY    . KNEE SURGERY Left   . polpectomy N/A   . SPINE SURGERY    . TUBAL LIGATION      Prior to Admission medications   Medication Sig Start Date End Date Taking? Authorizing Provider  albuterol (ACCUNEB) 1.25 MG/3ML nebulizer solution Take 1 ampule by nebulization every 6 (six) hours as needed for wheezing.    [provider]  azelastine (ASTELIN) 0.1 % nasal spray Place 1 spray into both nostrils 2 (two) times daily. Use in each nostril as directed    [provider]  cyproheptadine (PERIACTIN) 4 MG tablet Take 1 tablet (4 mg total) by mouth 3 (three) times daily as needed for allergies. 02/18/19   Larraine Argo, Dannielle Karvonen, PA-C  docusate sodium (COLACE) 100 MG capsule Take 100 mg by mouth 2 (two) times daily as needed for mild constipation.    [provider]  famotidine (PEPCID) 40 MG tablet Take 1 tablet (40 mg total) by mouth 2 (  two) times daily for 10 days. 02/18/19 02/28/19  Aayana Reinertsen, Dannielle Karvonen, PA-C  fluticasone (FLONASE) 50 MCG/ACT nasal spray Place 1 spray into the nose 2 (two) times a day.  04/07/17 08/30/18  [provider]  haloperidol (HALDOL) 1 MG tablet Take 1 tablet (1 mg total) by mouth at bedtime. 07/26/16 07/26/17  Arfeen, Arlyce Harman, MD  ipratropium-albuterol (DUONEB) 0.5-2.5 (3) MG/3ML SOLN Take 3 mLs by  nebulization.    [provider]  methocarbamol (ROBAXIN) 500 MG tablet Take 500-1,000 mg by mouth every 6 (six) hours as needed for muscle spasms.    [provider]  morphine (MS CONTIN) 15 MG 12 hr tablet Take 15 mg by mouth every 12 (twelve) hours.    [provider]  Multiple Vitamin (MULTIVITAMIN WITH MINERALS) TABS tablet Take 1 tablet by mouth daily.    [provider]  naloxone [facility]) nasal spray 4 mg/0.1 mL CALL 911. INSTILL 1 SPRAY IN ONE NOSTRIL. MAY REPEAT Q 2 TO 3 MINUTES IF SYMPTOMS OF AN OPIOID EMERGENCY PERSISTS. ALTERNATE NOSTRILS. 07/13/16   [provider]  omega-3 acid ethyl esters (LOVAZA) 1 g capsule Take 2 g by mouth daily.    [provider]  oxyCODONE (OXY IR/ROXICODONE) 5 MG immediate release tablet Take 10 mg by mouth every 6 (six) hours.  07/13/16   [provider]  perphenazine (TRILAFON) 8 MG tablet Take 16 mg by mouth 2 (two) times daily.    [provider]  predniSONE (DELTASONE) 20 MG tablet Take 3 tabs daily for 4 days, Take 2 tabs daily for 4 days, Take 1 tab daily for 4 days, Take 0.5 tab daily x 4 days. 02/18/19   Trace Cederberg, Dannielle Karvonen, PA-C  predniSONE (DELTASONE) 5 MG tablet Take 5 mg by mouth daily with breakfast.    [provider]  Roflumilast (DALIRESP) 250 MCG TABS Take by mouth.    [provider]  rOPINIRole (REQUIP) 0.5 MG tablet Take 1 tablet (0.5 mg total) by mouth at bedtime. 06/22/18 06/22/19  Kris Hartmann, NP  tiotropium (SPIRIVA) 18 MCG inhalation capsule Place into inhaler and inhale. 04/07/17 04/07/18  [provider]  tiZANidine (ZANAFLEX) 4 MG tablet TK 1 T PO BID PRN 01/30/18   [provider]  traZODone (DESYREL) 50 MG tablet TK 1 TO 2 T PO HS 01/17/18   [provider]    Allergies Abilify [aripiprazole], Baclofen, Bromfenac, Buprenorphine, Buprenorphine-naloxone, Bupropion, Ciprofloxacin, Ciprofloxacin hcl,  Clindamycin/lincomycin, Clonazepam, Codeine, Colestipol, Doxepin, Etodolac, Fentanyl, Flexeril [cyclobenzaprine], Flurbiprofen, Gabapentin, Haloperidol, Hydrocodone, Ibuprofen, Indocin [indomethacin], Ketoprofen, Latuda [lurasidone hcl], Lurasidone, Lyrica [pregabalin], Meloxicam, Methadone, Metronidazole, Mirtazapine, Nabumetone, Naproxen, Opana [oxymorphone hcl], Oxaprozin, Oxycodone, Oxymorphone, Paroxetine, Penicillins, Quetiapine, Seroquel [quetiapine fumarate], Sertraline, Suboxone [buprenorphine hcl-naloxone hcl], Tolmetin, Tramadol, Trazodone, Tylenol [acetaminophen], Valproic acid, Zoloft [sertraline hcl], and Clindamycin  Family History  Problem Relation Age of Onset  . Alcohol abuse Mother   . Cancer Mother   . COPD Mother   . Hearing loss Mother   . Vision loss Mother   . Alcohol abuse Father   . Depression Father   . Early death Father   . Mental illness Father     Social History Social History   Tobacco Use  . Smoking status: Current Every Day Smoker    Packs/day: 0.25    Years: 45.00    Pack years: 11.25    Types: Cigarettes  . Smokeless tobacco: Former Network engineer Use Topics  . Alcohol use: Yes  Alcohol/week: 1.0 standard drinks    Types: 1 Standard drinks or equivalent per week  . Drug use: No    Review of Systems  Constitutional: Negative for fever. Eyes: Negative for visual changes. ENT: Negative for sore throat. Cardiovascular: Negative for chest pain. Respiratory: Negative for shortness of breath. Musculoskeletal: Negative for back pain. Skin: Positive for rash and pruritis. Neurological: Negative for headaches, focal weakness or numbness. ____________________________________________  PHYSICAL EXAM:  VITAL SIGNS: ED Triage Vitals  Enc Vitals Group     BP 02/18/19 1611 (!) 157/96     Pulse Rate 02/18/19 1611 95     Resp 02/18/19 1611 20     Temp 02/18/19 1611 98.4 F (36.9 C)     Temp Source 02/18/19 1611 Oral     SpO2 02/18/19 1611 98  %     Weight 02/18/19 1609 (!) 313 lb 7.9 oz (142.2 kg)     Height 02/18/19 1609 5\' 5"  (1.651 m)     Head Circumference --      Peak Flow --      Pain Score 02/18/19 1609 0     Pain Loc --      Pain Edu? --      Excl. in [place]? --     Constitutional: Alert and oriented. Well appearing and in no distress. Head: Normocephalic and atraumatic. Eyes: Conjunctivae are normal. Normal extraocular movements Ears: Canals clear. TMs intact bilaterally.  Erythema noted to the pinna bilaterally. Nose: No congestion/rhinorrhea/epistaxis. Mouth/Throat: Mucous membranes are moist. Neck: Supple. No thyromegaly. Hematological/Lymphatic/Immunological: No cervical lymphadenopathy. Cardiovascular: Normal rate, regular rhythm. Normal distal pulses. Respiratory: Normal respiratory effort. No wheezes/rales/rhonchi. Gastrointestinal: Soft and nontender. No distention. Musculoskeletal: Nontender with normal range of motion in all extremities.  Neurologic:  Normal gait without ataxia. Normal speech and language. No gross focal neurologic deficits are appreciated. Skin:  Skin is warm, dry and intact.  Patient with generalized erythematous maculopapular rash noted to the head, neck, trunk, and extremities.  Excoriations are noted, but no blisters or vesicles are appreciated.  Skin is otherwise intact, warm, and dry. Psychiatric: Mood and affect are normal. Patient exhibits appropriate insight and judgment. ____________________________________________   LABS (pertinent positives/negatives) Labs Reviewed  BASIC METABOLIC PANEL - Abnormal; Notable for the following components:      Result Value   CO2 21 (*)    Glucose, Bld 109 (*)    All other components within normal limits  CBC WITH DIFFERENTIAL/PLATELET - Abnormal; Notable for the following components:   Hemoglobin 15.5 (*)    Eosinophils Absolute 0.8 (*)    Abs Immature Granulocytes 0.13 (*)    All other components within normal limits  C-REACTIVE PROTEIN -  Abnormal; Notable for the following components:   CRP 4.2 (*)    All other components within normal limits  SEDIMENTATION RATE  ____________________________________________  PROCEDURES  NS 500 ml bolus IVP Diphenhydramine 50 mg IVP Famotidine 20 mg IVPB Solumedrol 125 mg OVP Procedures ____________________________________________  INITIAL IMPRESSION / ASSESSMENT AND PLAN / ED COURSE  Patient to the ED for intermittent persistent pruritic rash.  She reports improvement of her symptoms at the time of this disposition.  She is been treated in the ED with IV antihistamines and steroids.  She will be discharged with a prescription on prednisone for a 16-day taper.  She is also being discharged with Periactin for antihistamine benefit as well as famotidine twice daily.  She is encouraged to follow with primary provider for ongoing symptom management.  Return precautions have been reviewed.  Madellyn Conner Cutillo was evaluated in Emergency Department on 02/19/2019 for the symptoms described in the history of present illness. She was evaluated in the context of the global COVID-19 pandemic, which necessitated consideration that the patient might be at risk for infection with the SARS-CoV-2 virus that causes COVID-19. Institutional protocols and algorithms that pertain to the evaluation of patients at risk for COVID-19 are in a state of rapid change based on information released by regulatory bodies including the CDC and federal and state organizations. These policies and algorithms were followed during the patient's care in the ED. ____________________________________________  FINAL CLINICAL IMPRESSION(S) / ED DIAGNOSES  Final diagnoses:  Urticaria      Carmie End, Dannielle Karvonen, PA-C 02/19/19 1542    Blake Divine, MD 02/19/19 2019

## 2019-02-18 NOTE — ED Triage Notes (Signed)
Rash to body x 2 weeks.  Has been seen via virtual calls, and just finished prednisone coarse, but rash persists.  Patient states she has numerous allergies.  States ran out of meds for a few days, but had restarted medications.  Stopped all medications except oxycodone, prednisone, Ropinirole and Pepcid last Thursday.  Rash has not improved.

## 2019-02-19 LAB — C-REACTIVE PROTEIN: CRP: 4.2 mg/dL — ABNORMAL HIGH (ref <1.0)

## 2020-08-27 ENCOUNTER — Ambulatory Visit
Admission: RE | Admit: 2020-08-27 | Discharge: 2020-08-27 | Disposition: A | Discharge status: Home / Self Care | Payer: Medicare HMO | Source: Ambulatory Visit | Attending: Family Medicine | Admitting: Family Medicine

## 2020-08-27 ENCOUNTER — Other Ambulatory Visit: Payer: Self-pay

## 2020-08-27 ENCOUNTER — Other Ambulatory Visit: Payer: Self-pay | Admitting: Family Medicine

## 2020-08-27 DIAGNOSIS — M7989 Other specified soft tissue disorders: Secondary | ICD-10-CM | POA: Insufficient documentation

## 2020-08-27 DIAGNOSIS — M79605 Pain in left leg: Secondary | ICD-10-CM | POA: Diagnosis present

## 2020-09-15 ENCOUNTER — Other Ambulatory Visit (INDEPENDENT_AMBULATORY_CARE_PROVIDER_SITE_OTHER): Payer: Self-pay | Admitting: Nurse Practitioner

## 2020-09-15 DIAGNOSIS — I739 Peripheral vascular disease, unspecified: Secondary | ICD-10-CM

## 2020-09-16 ENCOUNTER — Encounter (INDEPENDENT_AMBULATORY_CARE_PROVIDER_SITE_OTHER): Payer: Medicaid Other

## 2020-09-16 ENCOUNTER — Ambulatory Visit (INDEPENDENT_AMBULATORY_CARE_PROVIDER_SITE_OTHER): Payer: Medicaid Other | Admitting: Nurse Practitioner

## 2020-09-17 ENCOUNTER — Encounter (INDEPENDENT_AMBULATORY_CARE_PROVIDER_SITE_OTHER): Payer: Self-pay | Admitting: Nurse Practitioner

## 2021-01-13 ENCOUNTER — Emergency Department: Admission: EM | Admit: 2021-01-13 | Payer: Medicare Other | Source: Home / Self Care

## 2021-05-06 DIAGNOSIS — J449 Chronic obstructive pulmonary disease, unspecified: Secondary | ICD-10-CM | POA: Diagnosis not present

## 2021-05-06 DIAGNOSIS — J439 Emphysema, unspecified: Secondary | ICD-10-CM | POA: Diagnosis not present

## 2021-05-15 DIAGNOSIS — Z0000 Encounter for general adult medical examination without abnormal findings: Secondary | ICD-10-CM | POA: Diagnosis not present

## 2021-05-15 DIAGNOSIS — Z1389 Encounter for screening for other disorder: Secondary | ICD-10-CM | POA: Diagnosis not present

## 2021-05-15 DIAGNOSIS — M62838 Other muscle spasm: Secondary | ICD-10-CM | POA: Diagnosis not present

## 2021-06-04 DIAGNOSIS — G4733 Obstructive sleep apnea (adult) (pediatric): Secondary | ICD-10-CM | POA: Diagnosis not present

## 2021-06-04 DIAGNOSIS — R0609 Other forms of dyspnea: Secondary | ICD-10-CM | POA: Diagnosis not present

## 2021-06-04 DIAGNOSIS — J449 Chronic obstructive pulmonary disease, unspecified: Secondary | ICD-10-CM | POA: Diagnosis not present

## 2021-06-04 DIAGNOSIS — F17210 Nicotine dependence, cigarettes, uncomplicated: Secondary | ICD-10-CM | POA: Diagnosis not present

## 2021-06-06 DIAGNOSIS — J449 Chronic obstructive pulmonary disease, unspecified: Secondary | ICD-10-CM | POA: Diagnosis not present

## 2021-06-06 DIAGNOSIS — J439 Emphysema, unspecified: Secondary | ICD-10-CM | POA: Diagnosis not present

## 2021-06-09 DIAGNOSIS — J449 Chronic obstructive pulmonary disease, unspecified: Secondary | ICD-10-CM | POA: Diagnosis not present

## 2021-06-09 DIAGNOSIS — G894 Chronic pain syndrome: Secondary | ICD-10-CM | POA: Diagnosis not present

## 2021-06-09 DIAGNOSIS — M791 Myalgia, unspecified site: Secondary | ICD-10-CM | POA: Diagnosis not present

## 2021-06-10 DIAGNOSIS — G894 Chronic pain syndrome: Secondary | ICD-10-CM | POA: Diagnosis not present

## 2021-06-10 DIAGNOSIS — J449 Chronic obstructive pulmonary disease, unspecified: Secondary | ICD-10-CM | POA: Diagnosis not present

## 2021-06-10 DIAGNOSIS — M791 Myalgia, unspecified site: Secondary | ICD-10-CM | POA: Diagnosis not present

## 2021-06-11 DIAGNOSIS — J449 Chronic obstructive pulmonary disease, unspecified: Secondary | ICD-10-CM | POA: Diagnosis not present

## 2021-06-11 DIAGNOSIS — M791 Myalgia, unspecified site: Secondary | ICD-10-CM | POA: Diagnosis not present

## 2021-06-11 DIAGNOSIS — G894 Chronic pain syndrome: Secondary | ICD-10-CM | POA: Diagnosis not present

## 2021-06-12 DIAGNOSIS — G894 Chronic pain syndrome: Secondary | ICD-10-CM | POA: Diagnosis not present

## 2021-06-12 DIAGNOSIS — J449 Chronic obstructive pulmonary disease, unspecified: Secondary | ICD-10-CM | POA: Diagnosis not present

## 2021-06-12 DIAGNOSIS — M791 Myalgia, unspecified site: Secondary | ICD-10-CM | POA: Diagnosis not present

## 2021-06-13 DIAGNOSIS — J449 Chronic obstructive pulmonary disease, unspecified: Secondary | ICD-10-CM | POA: Diagnosis not present

## 2021-06-13 DIAGNOSIS — G894 Chronic pain syndrome: Secondary | ICD-10-CM | POA: Diagnosis not present

## 2021-06-13 DIAGNOSIS — M791 Myalgia, unspecified site: Secondary | ICD-10-CM | POA: Diagnosis not present

## 2021-06-14 DIAGNOSIS — J449 Chronic obstructive pulmonary disease, unspecified: Secondary | ICD-10-CM | POA: Diagnosis not present

## 2021-06-14 DIAGNOSIS — G894 Chronic pain syndrome: Secondary | ICD-10-CM | POA: Diagnosis not present

## 2021-06-14 DIAGNOSIS — M791 Myalgia, unspecified site: Secondary | ICD-10-CM | POA: Diagnosis not present

## 2021-06-15 DIAGNOSIS — M791 Myalgia, unspecified site: Secondary | ICD-10-CM | POA: Diagnosis not present

## 2021-06-15 DIAGNOSIS — J449 Chronic obstructive pulmonary disease, unspecified: Secondary | ICD-10-CM | POA: Diagnosis not present

## 2021-06-15 DIAGNOSIS — G894 Chronic pain syndrome: Secondary | ICD-10-CM | POA: Diagnosis not present

## 2021-06-16 DIAGNOSIS — J449 Chronic obstructive pulmonary disease, unspecified: Secondary | ICD-10-CM | POA: Diagnosis not present

## 2021-06-16 DIAGNOSIS — M791 Myalgia, unspecified site: Secondary | ICD-10-CM | POA: Diagnosis not present

## 2021-06-16 DIAGNOSIS — G894 Chronic pain syndrome: Secondary | ICD-10-CM | POA: Diagnosis not present

## 2021-06-17 DIAGNOSIS — G894 Chronic pain syndrome: Secondary | ICD-10-CM | POA: Diagnosis not present

## 2021-06-17 DIAGNOSIS — M791 Myalgia, unspecified site: Secondary | ICD-10-CM | POA: Diagnosis not present

## 2021-06-17 DIAGNOSIS — J449 Chronic obstructive pulmonary disease, unspecified: Secondary | ICD-10-CM | POA: Diagnosis not present

## 2021-06-18 DIAGNOSIS — J449 Chronic obstructive pulmonary disease, unspecified: Secondary | ICD-10-CM | POA: Diagnosis not present

## 2021-06-18 DIAGNOSIS — G894 Chronic pain syndrome: Secondary | ICD-10-CM | POA: Diagnosis not present

## 2021-06-18 DIAGNOSIS — M791 Myalgia, unspecified site: Secondary | ICD-10-CM | POA: Diagnosis not present

## 2021-06-19 DIAGNOSIS — Z1389 Encounter for screening for other disorder: Secondary | ICD-10-CM | POA: Diagnosis not present

## 2021-06-19 DIAGNOSIS — J449 Chronic obstructive pulmonary disease, unspecified: Secondary | ICD-10-CM | POA: Diagnosis not present

## 2021-06-19 DIAGNOSIS — M791 Myalgia, unspecified site: Secondary | ICD-10-CM | POA: Diagnosis not present

## 2021-06-19 DIAGNOSIS — Z1159 Encounter for screening for other viral diseases: Secondary | ICD-10-CM | POA: Diagnosis not present

## 2021-06-19 DIAGNOSIS — Z0000 Encounter for general adult medical examination without abnormal findings: Secondary | ICD-10-CM | POA: Diagnosis not present

## 2021-06-19 DIAGNOSIS — G894 Chronic pain syndrome: Secondary | ICD-10-CM | POA: Diagnosis not present

## 2021-06-20 DIAGNOSIS — M791 Myalgia, unspecified site: Secondary | ICD-10-CM | POA: Diagnosis not present

## 2021-06-20 DIAGNOSIS — J449 Chronic obstructive pulmonary disease, unspecified: Secondary | ICD-10-CM | POA: Diagnosis not present

## 2021-06-20 DIAGNOSIS — G894 Chronic pain syndrome: Secondary | ICD-10-CM | POA: Diagnosis not present

## 2021-06-21 DIAGNOSIS — G894 Chronic pain syndrome: Secondary | ICD-10-CM | POA: Diagnosis not present

## 2021-06-21 DIAGNOSIS — J449 Chronic obstructive pulmonary disease, unspecified: Secondary | ICD-10-CM | POA: Diagnosis not present

## 2021-06-21 DIAGNOSIS — M791 Myalgia, unspecified site: Secondary | ICD-10-CM | POA: Diagnosis not present

## 2021-06-22 DIAGNOSIS — M791 Myalgia, unspecified site: Secondary | ICD-10-CM | POA: Diagnosis not present

## 2021-06-22 DIAGNOSIS — J449 Chronic obstructive pulmonary disease, unspecified: Secondary | ICD-10-CM | POA: Diagnosis not present

## 2021-06-22 DIAGNOSIS — G894 Chronic pain syndrome: Secondary | ICD-10-CM | POA: Diagnosis not present

## 2021-06-23 ENCOUNTER — Ambulatory Visit: Payer: Medicare Other | Admitting: Adult Health

## 2021-06-23 DIAGNOSIS — G894 Chronic pain syndrome: Secondary | ICD-10-CM | POA: Diagnosis not present

## 2021-06-23 DIAGNOSIS — J449 Chronic obstructive pulmonary disease, unspecified: Secondary | ICD-10-CM | POA: Diagnosis not present

## 2021-06-23 DIAGNOSIS — M791 Myalgia, unspecified site: Secondary | ICD-10-CM | POA: Diagnosis not present

## 2021-06-24 DIAGNOSIS — M791 Myalgia, unspecified site: Secondary | ICD-10-CM | POA: Diagnosis not present

## 2021-06-24 DIAGNOSIS — G894 Chronic pain syndrome: Secondary | ICD-10-CM | POA: Diagnosis not present

## 2021-06-24 DIAGNOSIS — J449 Chronic obstructive pulmonary disease, unspecified: Secondary | ICD-10-CM | POA: Diagnosis not present

## 2021-06-25 DIAGNOSIS — G894 Chronic pain syndrome: Secondary | ICD-10-CM | POA: Diagnosis not present

## 2021-06-25 DIAGNOSIS — M791 Myalgia, unspecified site: Secondary | ICD-10-CM | POA: Diagnosis not present

## 2021-06-25 DIAGNOSIS — J449 Chronic obstructive pulmonary disease, unspecified: Secondary | ICD-10-CM | POA: Diagnosis not present

## 2021-06-26 DIAGNOSIS — M791 Myalgia, unspecified site: Secondary | ICD-10-CM | POA: Diagnosis not present

## 2021-06-26 DIAGNOSIS — J449 Chronic obstructive pulmonary disease, unspecified: Secondary | ICD-10-CM | POA: Diagnosis not present

## 2021-06-26 DIAGNOSIS — G894 Chronic pain syndrome: Secondary | ICD-10-CM | POA: Diagnosis not present

## 2021-06-27 DIAGNOSIS — G894 Chronic pain syndrome: Secondary | ICD-10-CM | POA: Diagnosis not present

## 2021-06-27 DIAGNOSIS — J449 Chronic obstructive pulmonary disease, unspecified: Secondary | ICD-10-CM | POA: Diagnosis not present

## 2021-06-27 DIAGNOSIS — M791 Myalgia, unspecified site: Secondary | ICD-10-CM | POA: Diagnosis not present

## 2021-06-28 DIAGNOSIS — G894 Chronic pain syndrome: Secondary | ICD-10-CM | POA: Diagnosis not present

## 2021-06-28 DIAGNOSIS — M791 Myalgia, unspecified site: Secondary | ICD-10-CM | POA: Diagnosis not present

## 2021-06-28 DIAGNOSIS — J449 Chronic obstructive pulmonary disease, unspecified: Secondary | ICD-10-CM | POA: Diagnosis not present

## 2021-06-29 DIAGNOSIS — G894 Chronic pain syndrome: Secondary | ICD-10-CM | POA: Diagnosis not present

## 2021-06-29 DIAGNOSIS — J449 Chronic obstructive pulmonary disease, unspecified: Secondary | ICD-10-CM | POA: Diagnosis not present

## 2021-06-29 DIAGNOSIS — M791 Myalgia, unspecified site: Secondary | ICD-10-CM | POA: Diagnosis not present

## 2021-06-30 DIAGNOSIS — G894 Chronic pain syndrome: Secondary | ICD-10-CM | POA: Diagnosis not present

## 2021-06-30 DIAGNOSIS — J449 Chronic obstructive pulmonary disease, unspecified: Secondary | ICD-10-CM | POA: Diagnosis not present

## 2021-06-30 DIAGNOSIS — M791 Myalgia, unspecified site: Secondary | ICD-10-CM | POA: Diagnosis not present

## 2021-07-01 DIAGNOSIS — M791 Myalgia, unspecified site: Secondary | ICD-10-CM | POA: Diagnosis not present

## 2021-07-01 DIAGNOSIS — J449 Chronic obstructive pulmonary disease, unspecified: Secondary | ICD-10-CM | POA: Diagnosis not present

## 2021-07-01 DIAGNOSIS — G894 Chronic pain syndrome: Secondary | ICD-10-CM | POA: Diagnosis not present

## 2021-07-02 DIAGNOSIS — J449 Chronic obstructive pulmonary disease, unspecified: Secondary | ICD-10-CM | POA: Diagnosis not present

## 2021-07-02 DIAGNOSIS — G894 Chronic pain syndrome: Secondary | ICD-10-CM | POA: Diagnosis not present

## 2021-07-02 DIAGNOSIS — M791 Myalgia, unspecified site: Secondary | ICD-10-CM | POA: Diagnosis not present

## 2021-07-03 DIAGNOSIS — J449 Chronic obstructive pulmonary disease, unspecified: Secondary | ICD-10-CM | POA: Diagnosis not present

## 2021-07-03 DIAGNOSIS — G894 Chronic pain syndrome: Secondary | ICD-10-CM | POA: Diagnosis not present

## 2021-07-03 DIAGNOSIS — M791 Myalgia, unspecified site: Secondary | ICD-10-CM | POA: Diagnosis not present

## 2021-07-04 DIAGNOSIS — G894 Chronic pain syndrome: Secondary | ICD-10-CM | POA: Diagnosis not present

## 2021-07-04 DIAGNOSIS — J449 Chronic obstructive pulmonary disease, unspecified: Secondary | ICD-10-CM | POA: Diagnosis not present

## 2021-07-04 DIAGNOSIS — M791 Myalgia, unspecified site: Secondary | ICD-10-CM | POA: Diagnosis not present

## 2021-07-05 DIAGNOSIS — M791 Myalgia, unspecified site: Secondary | ICD-10-CM | POA: Diagnosis not present

## 2021-07-05 DIAGNOSIS — J449 Chronic obstructive pulmonary disease, unspecified: Secondary | ICD-10-CM | POA: Diagnosis not present

## 2021-07-05 DIAGNOSIS — G894 Chronic pain syndrome: Secondary | ICD-10-CM | POA: Diagnosis not present

## 2021-07-06 DIAGNOSIS — G894 Chronic pain syndrome: Secondary | ICD-10-CM | POA: Diagnosis not present

## 2021-07-06 DIAGNOSIS — J449 Chronic obstructive pulmonary disease, unspecified: Secondary | ICD-10-CM | POA: Diagnosis not present

## 2021-07-06 DIAGNOSIS — M791 Myalgia, unspecified site: Secondary | ICD-10-CM | POA: Diagnosis not present

## 2021-07-06 DIAGNOSIS — G4733 Obstructive sleep apnea (adult) (pediatric): Secondary | ICD-10-CM | POA: Diagnosis not present

## 2021-07-06 DIAGNOSIS — J439 Emphysema, unspecified: Secondary | ICD-10-CM | POA: Diagnosis not present

## 2021-07-07 DIAGNOSIS — M791 Myalgia, unspecified site: Secondary | ICD-10-CM | POA: Diagnosis not present

## 2021-07-07 DIAGNOSIS — G894 Chronic pain syndrome: Secondary | ICD-10-CM | POA: Diagnosis not present

## 2021-07-07 DIAGNOSIS — J449 Chronic obstructive pulmonary disease, unspecified: Secondary | ICD-10-CM | POA: Diagnosis not present

## 2021-07-08 DIAGNOSIS — M791 Myalgia, unspecified site: Secondary | ICD-10-CM | POA: Diagnosis not present

## 2021-07-08 DIAGNOSIS — G894 Chronic pain syndrome: Secondary | ICD-10-CM | POA: Diagnosis not present

## 2021-07-08 DIAGNOSIS — J449 Chronic obstructive pulmonary disease, unspecified: Secondary | ICD-10-CM | POA: Diagnosis not present

## 2021-07-09 DIAGNOSIS — J449 Chronic obstructive pulmonary disease, unspecified: Secondary | ICD-10-CM | POA: Diagnosis not present

## 2021-07-09 DIAGNOSIS — G894 Chronic pain syndrome: Secondary | ICD-10-CM | POA: Diagnosis not present

## 2021-07-09 DIAGNOSIS — M791 Myalgia, unspecified site: Secondary | ICD-10-CM | POA: Diagnosis not present

## 2021-07-10 DIAGNOSIS — M791 Myalgia, unspecified site: Secondary | ICD-10-CM | POA: Diagnosis not present

## 2021-07-10 DIAGNOSIS — J449 Chronic obstructive pulmonary disease, unspecified: Secondary | ICD-10-CM | POA: Diagnosis not present

## 2021-07-10 DIAGNOSIS — G894 Chronic pain syndrome: Secondary | ICD-10-CM | POA: Diagnosis not present

## 2021-07-11 DIAGNOSIS — M791 Myalgia, unspecified site: Secondary | ICD-10-CM | POA: Diagnosis not present

## 2021-07-11 DIAGNOSIS — J449 Chronic obstructive pulmonary disease, unspecified: Secondary | ICD-10-CM | POA: Diagnosis not present

## 2021-07-11 DIAGNOSIS — G894 Chronic pain syndrome: Secondary | ICD-10-CM | POA: Diagnosis not present

## 2021-07-12 DIAGNOSIS — J449 Chronic obstructive pulmonary disease, unspecified: Secondary | ICD-10-CM | POA: Diagnosis not present

## 2021-07-12 DIAGNOSIS — G894 Chronic pain syndrome: Secondary | ICD-10-CM | POA: Diagnosis not present

## 2021-07-12 DIAGNOSIS — M791 Myalgia, unspecified site: Secondary | ICD-10-CM | POA: Diagnosis not present

## 2021-07-13 DIAGNOSIS — M791 Myalgia, unspecified site: Secondary | ICD-10-CM | POA: Diagnosis not present

## 2021-07-13 DIAGNOSIS — J449 Chronic obstructive pulmonary disease, unspecified: Secondary | ICD-10-CM | POA: Diagnosis not present

## 2021-07-13 DIAGNOSIS — G894 Chronic pain syndrome: Secondary | ICD-10-CM | POA: Diagnosis not present

## 2021-07-14 DIAGNOSIS — M791 Myalgia, unspecified site: Secondary | ICD-10-CM | POA: Diagnosis not present

## 2021-07-14 DIAGNOSIS — J449 Chronic obstructive pulmonary disease, unspecified: Secondary | ICD-10-CM | POA: Diagnosis not present

## 2021-07-14 DIAGNOSIS — G894 Chronic pain syndrome: Secondary | ICD-10-CM | POA: Diagnosis not present

## 2021-07-15 DIAGNOSIS — R1310 Dysphagia, unspecified: Secondary | ICD-10-CM | POA: Diagnosis not present

## 2021-07-15 DIAGNOSIS — M791 Myalgia, unspecified site: Secondary | ICD-10-CM | POA: Diagnosis not present

## 2021-07-15 DIAGNOSIS — R221 Localized swelling, mass and lump, neck: Secondary | ICD-10-CM | POA: Diagnosis not present

## 2021-07-15 DIAGNOSIS — M62838 Other muscle spasm: Secondary | ICD-10-CM | POA: Diagnosis not present

## 2021-07-15 DIAGNOSIS — J449 Chronic obstructive pulmonary disease, unspecified: Secondary | ICD-10-CM | POA: Diagnosis not present

## 2021-07-15 DIAGNOSIS — G894 Chronic pain syndrome: Secondary | ICD-10-CM | POA: Diagnosis not present

## 2021-07-15 DIAGNOSIS — R591 Generalized enlarged lymph nodes: Secondary | ICD-10-CM | POA: Diagnosis not present

## 2021-07-15 DIAGNOSIS — S00512A Abrasion of oral cavity, initial encounter: Secondary | ICD-10-CM | POA: Diagnosis not present

## 2021-07-16 DIAGNOSIS — M791 Myalgia, unspecified site: Secondary | ICD-10-CM | POA: Diagnosis not present

## 2021-07-16 DIAGNOSIS — G894 Chronic pain syndrome: Secondary | ICD-10-CM | POA: Diagnosis not present

## 2021-07-16 DIAGNOSIS — J449 Chronic obstructive pulmonary disease, unspecified: Secondary | ICD-10-CM | POA: Diagnosis not present

## 2021-07-17 DIAGNOSIS — G894 Chronic pain syndrome: Secondary | ICD-10-CM | POA: Diagnosis not present

## 2021-07-17 DIAGNOSIS — J449 Chronic obstructive pulmonary disease, unspecified: Secondary | ICD-10-CM | POA: Diagnosis not present

## 2021-07-17 DIAGNOSIS — M791 Myalgia, unspecified site: Secondary | ICD-10-CM | POA: Diagnosis not present

## 2021-07-18 DIAGNOSIS — J449 Chronic obstructive pulmonary disease, unspecified: Secondary | ICD-10-CM | POA: Diagnosis not present

## 2021-07-18 DIAGNOSIS — M791 Myalgia, unspecified site: Secondary | ICD-10-CM | POA: Diagnosis not present

## 2021-07-18 DIAGNOSIS — G894 Chronic pain syndrome: Secondary | ICD-10-CM | POA: Diagnosis not present

## 2021-07-19 DIAGNOSIS — J449 Chronic obstructive pulmonary disease, unspecified: Secondary | ICD-10-CM | POA: Diagnosis not present

## 2021-07-19 DIAGNOSIS — G894 Chronic pain syndrome: Secondary | ICD-10-CM | POA: Diagnosis not present

## 2021-07-19 DIAGNOSIS — M791 Myalgia, unspecified site: Secondary | ICD-10-CM | POA: Diagnosis not present

## 2021-07-20 DIAGNOSIS — M791 Myalgia, unspecified site: Secondary | ICD-10-CM | POA: Diagnosis not present

## 2021-07-20 DIAGNOSIS — G894 Chronic pain syndrome: Secondary | ICD-10-CM | POA: Diagnosis not present

## 2021-07-20 DIAGNOSIS — J449 Chronic obstructive pulmonary disease, unspecified: Secondary | ICD-10-CM | POA: Diagnosis not present

## 2021-07-21 DIAGNOSIS — G894 Chronic pain syndrome: Secondary | ICD-10-CM | POA: Diagnosis not present

## 2021-07-21 DIAGNOSIS — J449 Chronic obstructive pulmonary disease, unspecified: Secondary | ICD-10-CM | POA: Diagnosis not present

## 2021-07-21 DIAGNOSIS — M791 Myalgia, unspecified site: Secondary | ICD-10-CM | POA: Diagnosis not present

## 2021-07-22 DIAGNOSIS — J449 Chronic obstructive pulmonary disease, unspecified: Secondary | ICD-10-CM | POA: Diagnosis not present

## 2021-07-22 DIAGNOSIS — G894 Chronic pain syndrome: Secondary | ICD-10-CM | POA: Diagnosis not present

## 2021-07-22 DIAGNOSIS — M791 Myalgia, unspecified site: Secondary | ICD-10-CM | POA: Diagnosis not present

## 2021-07-23 DIAGNOSIS — J449 Chronic obstructive pulmonary disease, unspecified: Secondary | ICD-10-CM | POA: Diagnosis not present

## 2021-07-23 DIAGNOSIS — M791 Myalgia, unspecified site: Secondary | ICD-10-CM | POA: Diagnosis not present

## 2021-07-23 DIAGNOSIS — G894 Chronic pain syndrome: Secondary | ICD-10-CM | POA: Diagnosis not present

## 2021-07-24 DIAGNOSIS — J449 Chronic obstructive pulmonary disease, unspecified: Secondary | ICD-10-CM | POA: Diagnosis not present

## 2021-07-24 DIAGNOSIS — G894 Chronic pain syndrome: Secondary | ICD-10-CM | POA: Diagnosis not present

## 2021-07-24 DIAGNOSIS — M791 Myalgia, unspecified site: Secondary | ICD-10-CM | POA: Diagnosis not present

## 2021-07-25 DIAGNOSIS — J449 Chronic obstructive pulmonary disease, unspecified: Secondary | ICD-10-CM | POA: Diagnosis not present

## 2021-07-25 DIAGNOSIS — M791 Myalgia, unspecified site: Secondary | ICD-10-CM | POA: Diagnosis not present

## 2021-07-25 DIAGNOSIS — G894 Chronic pain syndrome: Secondary | ICD-10-CM | POA: Diagnosis not present

## 2021-07-26 DIAGNOSIS — G894 Chronic pain syndrome: Secondary | ICD-10-CM | POA: Diagnosis not present

## 2021-07-26 DIAGNOSIS — M791 Myalgia, unspecified site: Secondary | ICD-10-CM | POA: Diagnosis not present

## 2021-07-26 DIAGNOSIS — J449 Chronic obstructive pulmonary disease, unspecified: Secondary | ICD-10-CM | POA: Diagnosis not present

## 2021-07-27 DIAGNOSIS — G894 Chronic pain syndrome: Secondary | ICD-10-CM | POA: Diagnosis not present

## 2021-07-27 DIAGNOSIS — J449 Chronic obstructive pulmonary disease, unspecified: Secondary | ICD-10-CM | POA: Diagnosis not present

## 2021-07-27 DIAGNOSIS — M791 Myalgia, unspecified site: Secondary | ICD-10-CM | POA: Diagnosis not present

## 2021-07-28 DIAGNOSIS — G894 Chronic pain syndrome: Secondary | ICD-10-CM | POA: Diagnosis not present

## 2021-07-28 DIAGNOSIS — M791 Myalgia, unspecified site: Secondary | ICD-10-CM | POA: Diagnosis not present

## 2021-07-28 DIAGNOSIS — J449 Chronic obstructive pulmonary disease, unspecified: Secondary | ICD-10-CM | POA: Diagnosis not present

## 2021-07-29 DIAGNOSIS — G894 Chronic pain syndrome: Secondary | ICD-10-CM | POA: Diagnosis not present

## 2021-07-29 DIAGNOSIS — M791 Myalgia, unspecified site: Secondary | ICD-10-CM | POA: Diagnosis not present

## 2021-07-29 DIAGNOSIS — J449 Chronic obstructive pulmonary disease, unspecified: Secondary | ICD-10-CM | POA: Diagnosis not present

## 2021-07-30 DIAGNOSIS — J449 Chronic obstructive pulmonary disease, unspecified: Secondary | ICD-10-CM | POA: Diagnosis not present

## 2021-07-30 DIAGNOSIS — G894 Chronic pain syndrome: Secondary | ICD-10-CM | POA: Diagnosis not present

## 2021-07-30 DIAGNOSIS — M791 Myalgia, unspecified site: Secondary | ICD-10-CM | POA: Diagnosis not present

## 2021-07-31 DIAGNOSIS — G894 Chronic pain syndrome: Secondary | ICD-10-CM | POA: Diagnosis not present

## 2021-07-31 DIAGNOSIS — M791 Myalgia, unspecified site: Secondary | ICD-10-CM | POA: Diagnosis not present

## 2021-07-31 DIAGNOSIS — J449 Chronic obstructive pulmonary disease, unspecified: Secondary | ICD-10-CM | POA: Diagnosis not present

## 2021-08-01 DIAGNOSIS — J449 Chronic obstructive pulmonary disease, unspecified: Secondary | ICD-10-CM | POA: Diagnosis not present

## 2021-08-01 DIAGNOSIS — G894 Chronic pain syndrome: Secondary | ICD-10-CM | POA: Diagnosis not present

## 2021-08-01 DIAGNOSIS — M791 Myalgia, unspecified site: Secondary | ICD-10-CM | POA: Diagnosis not present

## 2021-08-02 DIAGNOSIS — G894 Chronic pain syndrome: Secondary | ICD-10-CM | POA: Diagnosis not present

## 2021-08-02 DIAGNOSIS — M791 Myalgia, unspecified site: Secondary | ICD-10-CM | POA: Diagnosis not present

## 2021-08-02 DIAGNOSIS — J449 Chronic obstructive pulmonary disease, unspecified: Secondary | ICD-10-CM | POA: Diagnosis not present

## 2021-08-03 DIAGNOSIS — M791 Myalgia, unspecified site: Secondary | ICD-10-CM | POA: Diagnosis not present

## 2021-08-03 DIAGNOSIS — J449 Chronic obstructive pulmonary disease, unspecified: Secondary | ICD-10-CM | POA: Diagnosis not present

## 2021-08-03 DIAGNOSIS — G894 Chronic pain syndrome: Secondary | ICD-10-CM | POA: Diagnosis not present

## 2021-08-04 DIAGNOSIS — G4733 Obstructive sleep apnea (adult) (pediatric): Secondary | ICD-10-CM | POA: Diagnosis not present

## 2021-08-04 DIAGNOSIS — Z9981 Dependence on supplemental oxygen: Secondary | ICD-10-CM | POA: Diagnosis not present

## 2021-08-04 DIAGNOSIS — R0602 Shortness of breath: Secondary | ICD-10-CM | POA: Diagnosis not present

## 2021-08-04 DIAGNOSIS — M791 Myalgia, unspecified site: Secondary | ICD-10-CM | POA: Diagnosis not present

## 2021-08-04 DIAGNOSIS — J449 Chronic obstructive pulmonary disease, unspecified: Secondary | ICD-10-CM | POA: Diagnosis not present

## 2021-08-04 DIAGNOSIS — G894 Chronic pain syndrome: Secondary | ICD-10-CM | POA: Diagnosis not present

## 2021-08-04 DIAGNOSIS — K625 Hemorrhage of anus and rectum: Secondary | ICD-10-CM | POA: Diagnosis not present

## 2021-08-05 DIAGNOSIS — M791 Myalgia, unspecified site: Secondary | ICD-10-CM | POA: Diagnosis not present

## 2021-08-05 DIAGNOSIS — J449 Chronic obstructive pulmonary disease, unspecified: Secondary | ICD-10-CM | POA: Diagnosis not present

## 2021-08-05 DIAGNOSIS — G894 Chronic pain syndrome: Secondary | ICD-10-CM | POA: Diagnosis not present

## 2021-08-06 DIAGNOSIS — M791 Myalgia, unspecified site: Secondary | ICD-10-CM | POA: Diagnosis not present

## 2021-08-06 DIAGNOSIS — J439 Emphysema, unspecified: Secondary | ICD-10-CM | POA: Diagnosis not present

## 2021-08-06 DIAGNOSIS — G894 Chronic pain syndrome: Secondary | ICD-10-CM | POA: Diagnosis not present

## 2021-08-06 DIAGNOSIS — J449 Chronic obstructive pulmonary disease, unspecified: Secondary | ICD-10-CM | POA: Diagnosis not present

## 2021-08-06 IMAGING — US US EXTREM LOW VENOUS*L*
1 series · 13 of 24 positions shown · non-contrast
Comparison: None.

CLINICAL DATA: 55-year-old female with left lower extremity pain
and swelling for 2 weeks.

EXAM:
LEFT LOWER EXTREMITY VENOUS DOPPLER ULTRASOUND
TECHNIQUE: Gray-scale sonography with graded compression, as well as color
Doppler and duplex ultrasound were performed to evaluate the left
lower extremity deep venous systems from the level of the common
femoral vein and including the common femoral, femoral, profunda
femoral, popliteal and calf veins including the posterior tibial,
peroneal and gastrocnemius veins when visible. Spectral Doppler was
utilized to evaluate flow at rest and with distal augmentation
maneuvers in the common femoral, femoral and popliteal veins. The
contralateral common femoral vein was also evaluated for comparison.

[Series 1: us venous img lower uni left (dvt) · portal-venous · 13 of 34 slices shown]
[im 1/34]
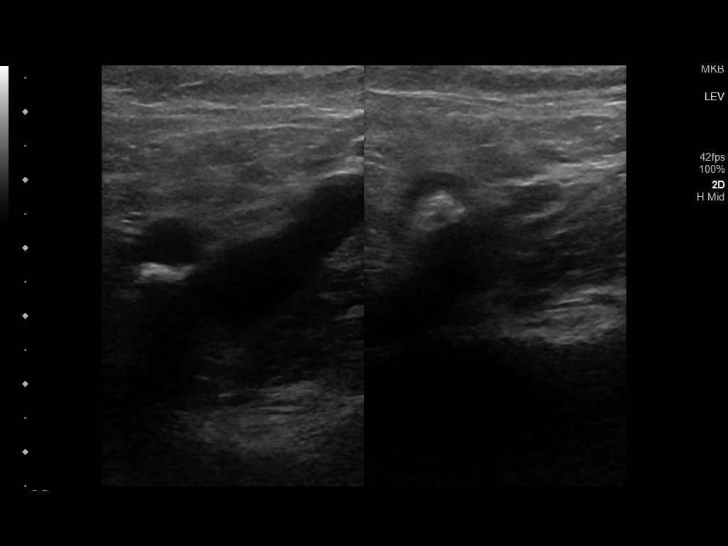
[im 3/34]
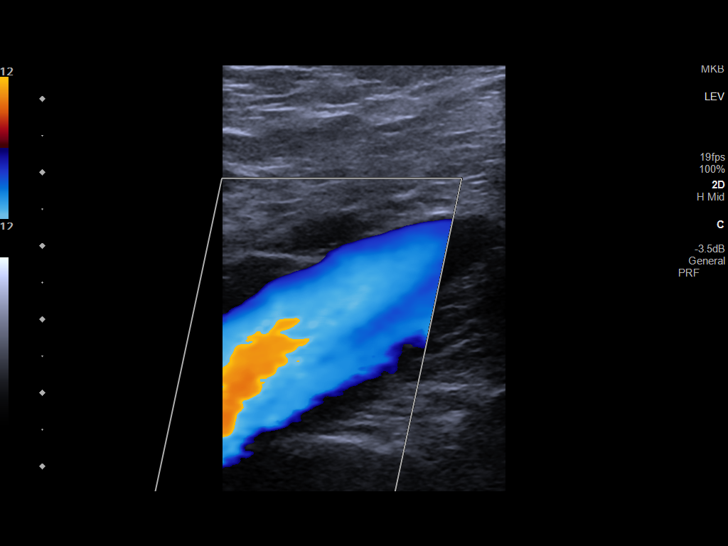
[im 6/34]
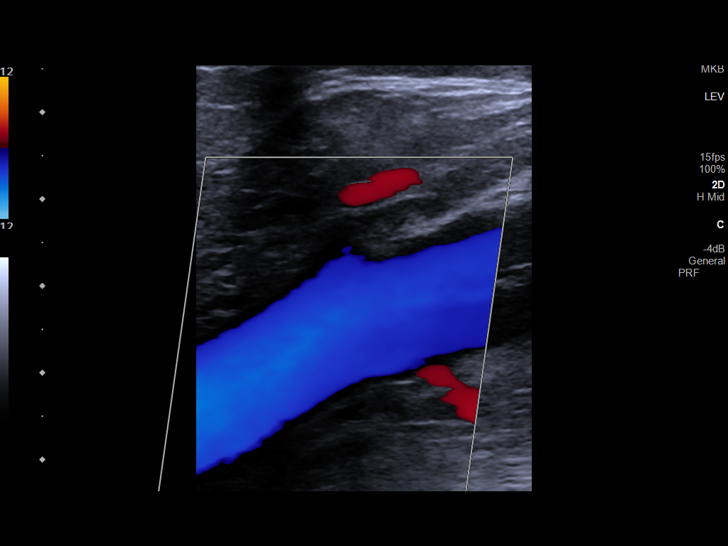
[im 9/34]
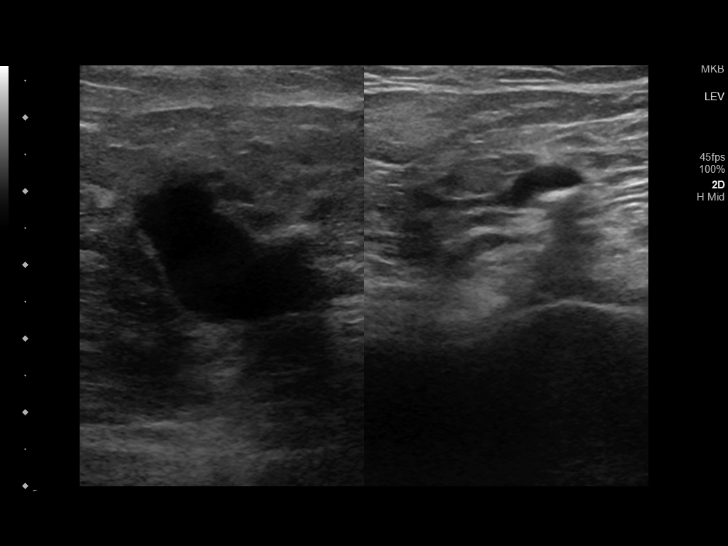
[im 12/34]
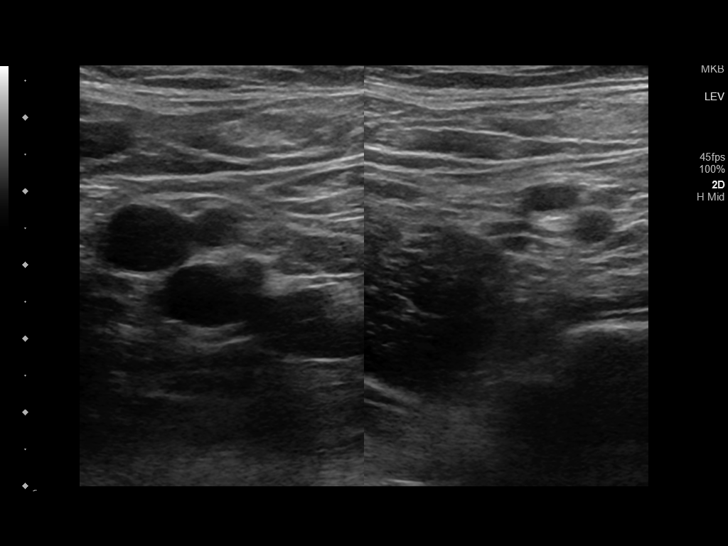
[im 15/34]
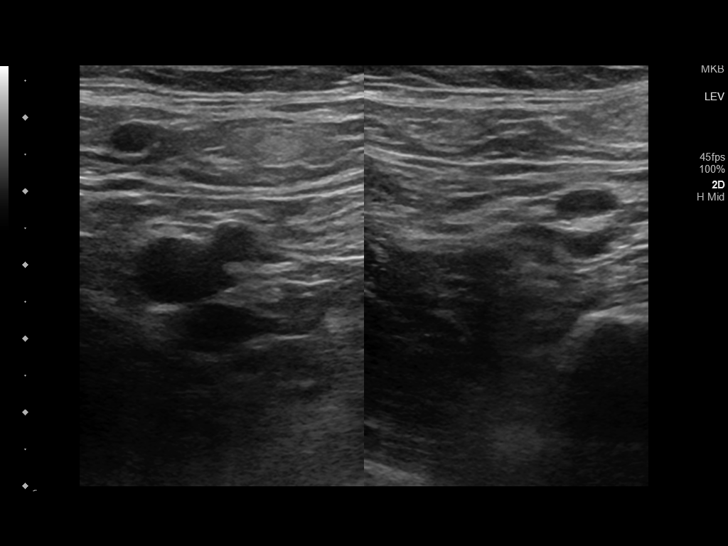
[im 18/34]
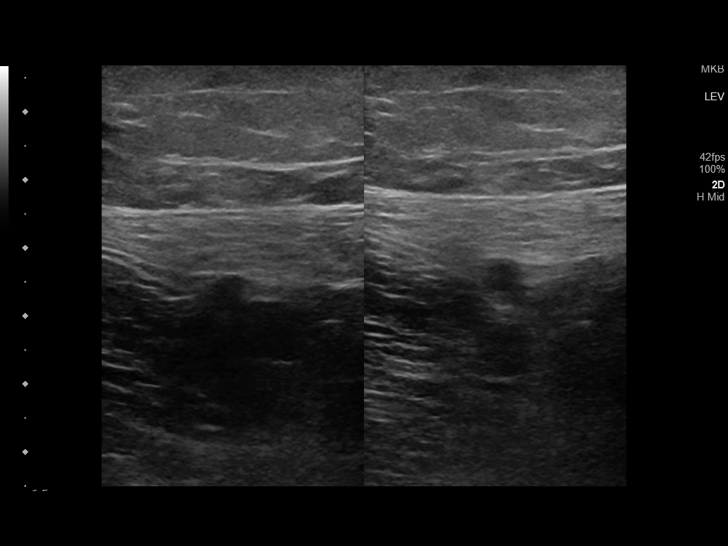
[im 19/34]
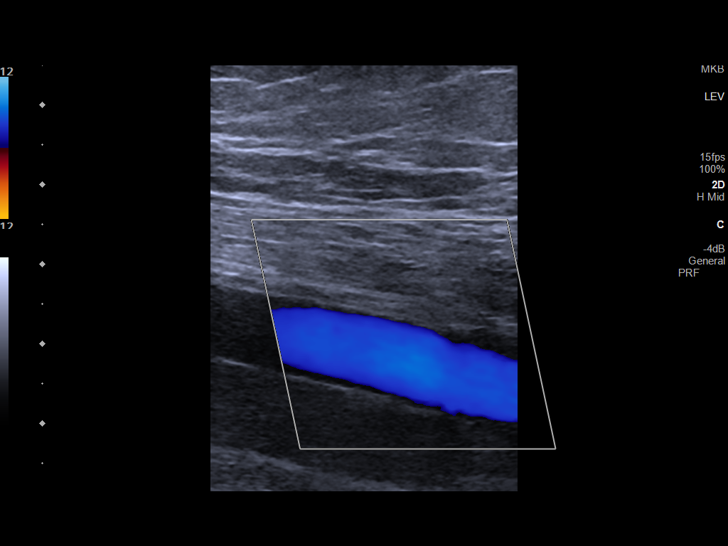
[im 22/34]
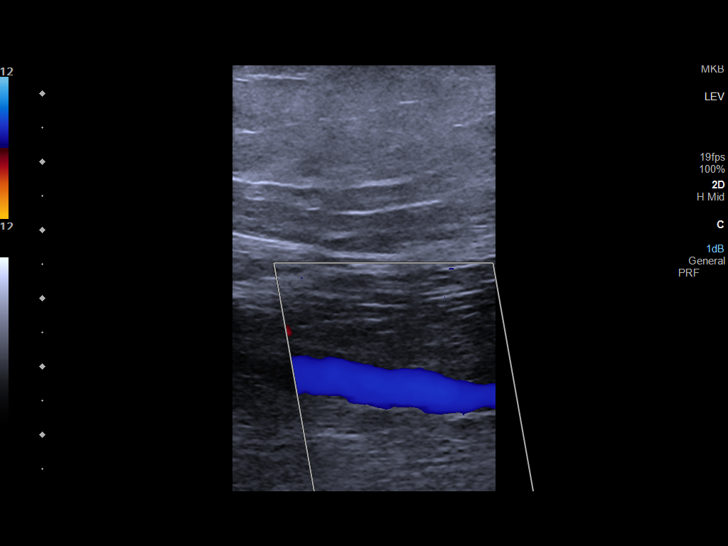
[im 25/34]
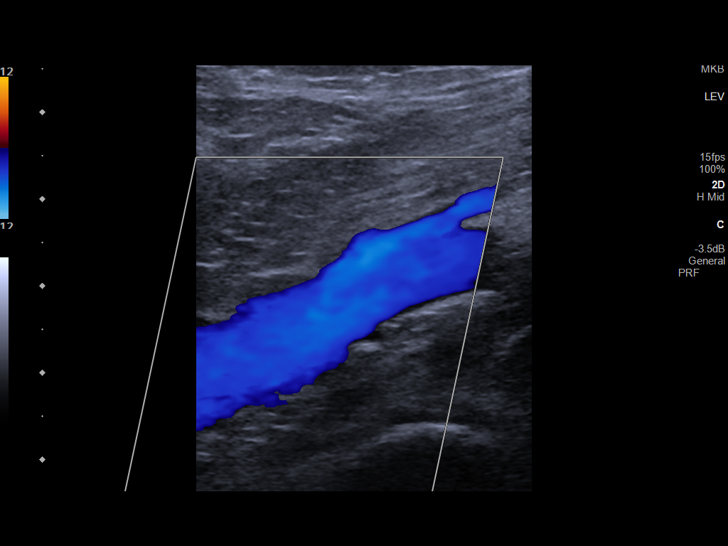
[im 28/34]
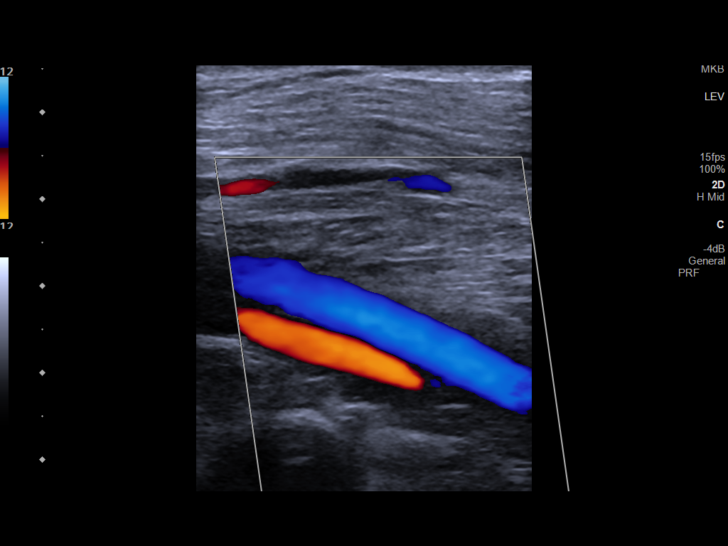
[im 31/34]
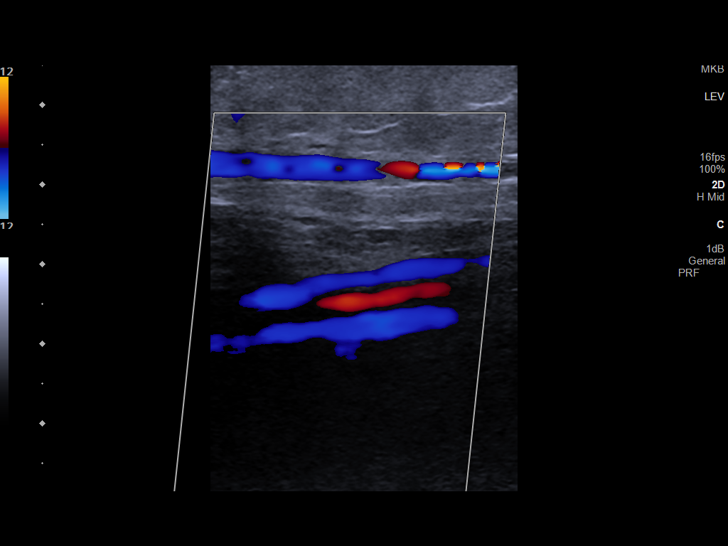
[im 34/34]
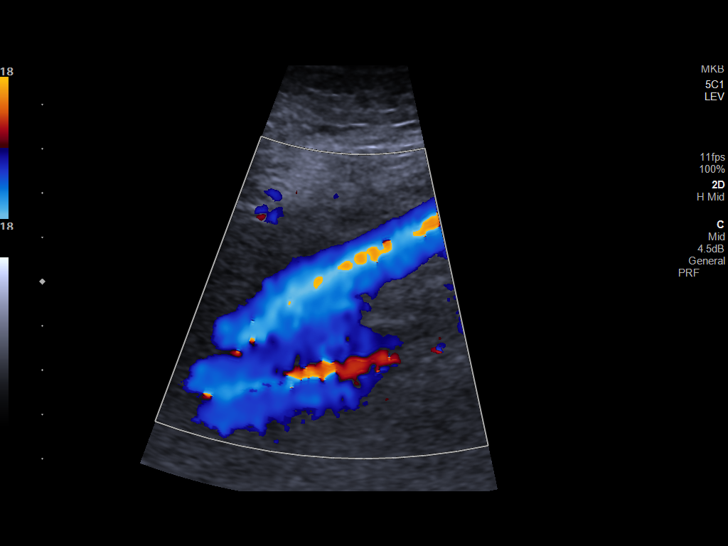

[13 of 24 positions shown; findings below may reference images not displayed]

FINDINGS: LEFT LOWER EXTREMITY

Common Femoral Vein: No evidence of thrombus. Normal
compressibility, respiratory phasicity and response to augmentation.

Central Greater Saphenous Vein: No evidence of thrombus. Normal
compressibility and flow on color Doppler imaging.

Central Profunda Femoral Vein: No evidence of thrombus. Normal
compressibility and flow on color Doppler imaging.

Femoral Vein: No evidence of thrombus. Normal compressibility,
respiratory phasicity and response to augmentation.

Popliteal Vein: No evidence of thrombus. Normal compressibility,
respiratory phasicity and response to augmentation.

Calf Veins: No evidence of thrombus. Normal compressibility and flow
on color Doppler imaging.

Other Findings:  None.

RIGHT LOWER EXTREMITY

Common Femoral Vein: No evidence of thrombus. Normal
compressibility, respiratory phasicity and response to augmentation.
IMPRESSION: No evidence of left lower extremity deep venous thrombosis.

## 2021-08-07 DIAGNOSIS — G894 Chronic pain syndrome: Secondary | ICD-10-CM | POA: Diagnosis not present

## 2021-08-07 DIAGNOSIS — J449 Chronic obstructive pulmonary disease, unspecified: Secondary | ICD-10-CM | POA: Diagnosis not present

## 2021-08-07 DIAGNOSIS — M791 Myalgia, unspecified site: Secondary | ICD-10-CM | POA: Diagnosis not present

## 2021-08-08 DIAGNOSIS — J449 Chronic obstructive pulmonary disease, unspecified: Secondary | ICD-10-CM | POA: Diagnosis not present

## 2021-08-08 DIAGNOSIS — G894 Chronic pain syndrome: Secondary | ICD-10-CM | POA: Diagnosis not present

## 2021-08-08 DIAGNOSIS — M791 Myalgia, unspecified site: Secondary | ICD-10-CM | POA: Diagnosis not present

## 2021-08-09 DIAGNOSIS — M791 Myalgia, unspecified site: Secondary | ICD-10-CM | POA: Diagnosis not present

## 2021-08-09 DIAGNOSIS — J449 Chronic obstructive pulmonary disease, unspecified: Secondary | ICD-10-CM | POA: Diagnosis not present

## 2021-08-09 DIAGNOSIS — G894 Chronic pain syndrome: Secondary | ICD-10-CM | POA: Diagnosis not present

## 2021-08-10 DIAGNOSIS — M791 Myalgia, unspecified site: Secondary | ICD-10-CM | POA: Diagnosis not present

## 2021-08-10 DIAGNOSIS — G894 Chronic pain syndrome: Secondary | ICD-10-CM | POA: Diagnosis not present

## 2021-08-10 DIAGNOSIS — J449 Chronic obstructive pulmonary disease, unspecified: Secondary | ICD-10-CM | POA: Diagnosis not present

## 2021-08-11 DIAGNOSIS — J449 Chronic obstructive pulmonary disease, unspecified: Secondary | ICD-10-CM | POA: Diagnosis not present

## 2021-08-11 DIAGNOSIS — G894 Chronic pain syndrome: Secondary | ICD-10-CM | POA: Diagnosis not present

## 2021-08-11 DIAGNOSIS — M791 Myalgia, unspecified site: Secondary | ICD-10-CM | POA: Diagnosis not present

## 2021-08-12 DIAGNOSIS — M791 Myalgia, unspecified site: Secondary | ICD-10-CM | POA: Diagnosis not present

## 2021-08-12 DIAGNOSIS — J449 Chronic obstructive pulmonary disease, unspecified: Secondary | ICD-10-CM | POA: Diagnosis not present

## 2021-08-12 DIAGNOSIS — G894 Chronic pain syndrome: Secondary | ICD-10-CM | POA: Diagnosis not present

## 2021-08-13 DIAGNOSIS — G894 Chronic pain syndrome: Secondary | ICD-10-CM | POA: Diagnosis not present

## 2021-08-13 DIAGNOSIS — J449 Chronic obstructive pulmonary disease, unspecified: Secondary | ICD-10-CM | POA: Diagnosis not present

## 2021-08-13 DIAGNOSIS — M791 Myalgia, unspecified site: Secondary | ICD-10-CM | POA: Diagnosis not present

## 2021-08-14 DIAGNOSIS — J449 Chronic obstructive pulmonary disease, unspecified: Secondary | ICD-10-CM | POA: Diagnosis not present

## 2021-08-14 DIAGNOSIS — G894 Chronic pain syndrome: Secondary | ICD-10-CM | POA: Diagnosis not present

## 2021-08-14 DIAGNOSIS — M791 Myalgia, unspecified site: Secondary | ICD-10-CM | POA: Diagnosis not present

## 2021-08-15 DIAGNOSIS — J449 Chronic obstructive pulmonary disease, unspecified: Secondary | ICD-10-CM | POA: Diagnosis not present

## 2021-08-15 DIAGNOSIS — G894 Chronic pain syndrome: Secondary | ICD-10-CM | POA: Diagnosis not present

## 2021-08-15 DIAGNOSIS — M791 Myalgia, unspecified site: Secondary | ICD-10-CM | POA: Diagnosis not present

## 2021-08-16 DIAGNOSIS — G894 Chronic pain syndrome: Secondary | ICD-10-CM | POA: Diagnosis not present

## 2021-08-16 DIAGNOSIS — J449 Chronic obstructive pulmonary disease, unspecified: Secondary | ICD-10-CM | POA: Diagnosis not present

## 2021-08-16 DIAGNOSIS — M791 Myalgia, unspecified site: Secondary | ICD-10-CM | POA: Diagnosis not present

## 2021-08-17 DIAGNOSIS — J449 Chronic obstructive pulmonary disease, unspecified: Secondary | ICD-10-CM | POA: Diagnosis not present

## 2021-08-17 DIAGNOSIS — M791 Myalgia, unspecified site: Secondary | ICD-10-CM | POA: Diagnosis not present

## 2021-08-17 DIAGNOSIS — G894 Chronic pain syndrome: Secondary | ICD-10-CM | POA: Diagnosis not present

## 2021-08-18 DIAGNOSIS — M791 Myalgia, unspecified site: Secondary | ICD-10-CM | POA: Diagnosis not present

## 2021-08-18 DIAGNOSIS — J449 Chronic obstructive pulmonary disease, unspecified: Secondary | ICD-10-CM | POA: Diagnosis not present

## 2021-08-18 DIAGNOSIS — G894 Chronic pain syndrome: Secondary | ICD-10-CM | POA: Diagnosis not present

## 2021-08-19 DIAGNOSIS — M791 Myalgia, unspecified site: Secondary | ICD-10-CM | POA: Diagnosis not present

## 2021-08-19 DIAGNOSIS — G894 Chronic pain syndrome: Secondary | ICD-10-CM | POA: Diagnosis not present

## 2021-08-19 DIAGNOSIS — J449 Chronic obstructive pulmonary disease, unspecified: Secondary | ICD-10-CM | POA: Diagnosis not present

## 2021-08-20 DIAGNOSIS — J449 Chronic obstructive pulmonary disease, unspecified: Secondary | ICD-10-CM | POA: Diagnosis not present

## 2021-08-20 DIAGNOSIS — M791 Myalgia, unspecified site: Secondary | ICD-10-CM | POA: Diagnosis not present

## 2021-08-20 DIAGNOSIS — G894 Chronic pain syndrome: Secondary | ICD-10-CM | POA: Diagnosis not present

## 2021-08-21 DIAGNOSIS — G894 Chronic pain syndrome: Secondary | ICD-10-CM | POA: Diagnosis not present

## 2021-08-21 DIAGNOSIS — J449 Chronic obstructive pulmonary disease, unspecified: Secondary | ICD-10-CM | POA: Diagnosis not present

## 2021-08-21 DIAGNOSIS — M791 Myalgia, unspecified site: Secondary | ICD-10-CM | POA: Diagnosis not present

## 2021-08-22 DIAGNOSIS — G894 Chronic pain syndrome: Secondary | ICD-10-CM | POA: Diagnosis not present

## 2021-08-22 DIAGNOSIS — M791 Myalgia, unspecified site: Secondary | ICD-10-CM | POA: Diagnosis not present

## 2021-08-22 DIAGNOSIS — J449 Chronic obstructive pulmonary disease, unspecified: Secondary | ICD-10-CM | POA: Diagnosis not present

## 2021-08-23 DIAGNOSIS — J449 Chronic obstructive pulmonary disease, unspecified: Secondary | ICD-10-CM | POA: Diagnosis not present

## 2021-08-23 DIAGNOSIS — G894 Chronic pain syndrome: Secondary | ICD-10-CM | POA: Diagnosis not present

## 2021-08-23 DIAGNOSIS — M791 Myalgia, unspecified site: Secondary | ICD-10-CM | POA: Diagnosis not present

## 2021-08-24 DIAGNOSIS — M791 Myalgia, unspecified site: Secondary | ICD-10-CM | POA: Diagnosis not present

## 2021-08-24 DIAGNOSIS — J449 Chronic obstructive pulmonary disease, unspecified: Secondary | ICD-10-CM | POA: Diagnosis not present

## 2021-08-24 DIAGNOSIS — G894 Chronic pain syndrome: Secondary | ICD-10-CM | POA: Diagnosis not present

## 2021-08-25 DIAGNOSIS — M791 Myalgia, unspecified site: Secondary | ICD-10-CM | POA: Diagnosis not present

## 2021-08-25 DIAGNOSIS — J449 Chronic obstructive pulmonary disease, unspecified: Secondary | ICD-10-CM | POA: Diagnosis not present

## 2021-08-25 DIAGNOSIS — G894 Chronic pain syndrome: Secondary | ICD-10-CM | POA: Diagnosis not present

## 2021-08-26 DIAGNOSIS — M791 Myalgia, unspecified site: Secondary | ICD-10-CM | POA: Diagnosis not present

## 2021-08-26 DIAGNOSIS — G894 Chronic pain syndrome: Secondary | ICD-10-CM | POA: Diagnosis not present

## 2021-08-26 DIAGNOSIS — J449 Chronic obstructive pulmonary disease, unspecified: Secondary | ICD-10-CM | POA: Diagnosis not present

## 2021-08-27 DIAGNOSIS — M791 Myalgia, unspecified site: Secondary | ICD-10-CM | POA: Diagnosis not present

## 2021-08-27 DIAGNOSIS — J449 Chronic obstructive pulmonary disease, unspecified: Secondary | ICD-10-CM | POA: Diagnosis not present

## 2021-08-27 DIAGNOSIS — G894 Chronic pain syndrome: Secondary | ICD-10-CM | POA: Diagnosis not present

## 2021-08-28 DIAGNOSIS — G894 Chronic pain syndrome: Secondary | ICD-10-CM | POA: Diagnosis not present

## 2021-08-28 DIAGNOSIS — M791 Myalgia, unspecified site: Secondary | ICD-10-CM | POA: Diagnosis not present

## 2021-08-28 DIAGNOSIS — J449 Chronic obstructive pulmonary disease, unspecified: Secondary | ICD-10-CM | POA: Diagnosis not present

## 2021-08-29 DIAGNOSIS — G894 Chronic pain syndrome: Secondary | ICD-10-CM | POA: Diagnosis not present

## 2021-08-29 DIAGNOSIS — J449 Chronic obstructive pulmonary disease, unspecified: Secondary | ICD-10-CM | POA: Diagnosis not present

## 2021-08-29 DIAGNOSIS — M791 Myalgia, unspecified site: Secondary | ICD-10-CM | POA: Diagnosis not present

## 2021-08-30 DIAGNOSIS — G894 Chronic pain syndrome: Secondary | ICD-10-CM | POA: Diagnosis not present

## 2021-08-30 DIAGNOSIS — M791 Myalgia, unspecified site: Secondary | ICD-10-CM | POA: Diagnosis not present

## 2021-08-30 DIAGNOSIS — J449 Chronic obstructive pulmonary disease, unspecified: Secondary | ICD-10-CM | POA: Diagnosis not present

## 2021-08-31 DIAGNOSIS — G894 Chronic pain syndrome: Secondary | ICD-10-CM | POA: Diagnosis not present

## 2021-08-31 DIAGNOSIS — J449 Chronic obstructive pulmonary disease, unspecified: Secondary | ICD-10-CM | POA: Diagnosis not present

## 2021-08-31 DIAGNOSIS — M791 Myalgia, unspecified site: Secondary | ICD-10-CM | POA: Diagnosis not present

## 2021-09-02 DIAGNOSIS — G894 Chronic pain syndrome: Secondary | ICD-10-CM | POA: Diagnosis not present

## 2021-09-02 DIAGNOSIS — M791 Myalgia, unspecified site: Secondary | ICD-10-CM | POA: Diagnosis not present

## 2021-09-02 DIAGNOSIS — J449 Chronic obstructive pulmonary disease, unspecified: Secondary | ICD-10-CM | POA: Diagnosis not present

## 2021-09-03 DIAGNOSIS — J449 Chronic obstructive pulmonary disease, unspecified: Secondary | ICD-10-CM | POA: Diagnosis not present

## 2021-09-03 DIAGNOSIS — M791 Myalgia, unspecified site: Secondary | ICD-10-CM | POA: Diagnosis not present

## 2021-09-03 DIAGNOSIS — G894 Chronic pain syndrome: Secondary | ICD-10-CM | POA: Diagnosis not present

## 2021-09-04 DIAGNOSIS — G894 Chronic pain syndrome: Secondary | ICD-10-CM | POA: Diagnosis not present

## 2021-09-04 DIAGNOSIS — J449 Chronic obstructive pulmonary disease, unspecified: Secondary | ICD-10-CM | POA: Diagnosis not present

## 2021-09-04 DIAGNOSIS — M791 Myalgia, unspecified site: Secondary | ICD-10-CM | POA: Diagnosis not present

## 2021-09-05 DIAGNOSIS — J439 Emphysema, unspecified: Secondary | ICD-10-CM | POA: Diagnosis not present

## 2021-09-05 DIAGNOSIS — M791 Myalgia, unspecified site: Secondary | ICD-10-CM | POA: Diagnosis not present

## 2021-09-05 DIAGNOSIS — J449 Chronic obstructive pulmonary disease, unspecified: Secondary | ICD-10-CM | POA: Diagnosis not present

## 2021-09-05 DIAGNOSIS — G894 Chronic pain syndrome: Secondary | ICD-10-CM | POA: Diagnosis not present

## 2021-09-06 DIAGNOSIS — M791 Myalgia, unspecified site: Secondary | ICD-10-CM | POA: Diagnosis not present

## 2021-09-06 DIAGNOSIS — G894 Chronic pain syndrome: Secondary | ICD-10-CM | POA: Diagnosis not present

## 2021-09-06 DIAGNOSIS — J449 Chronic obstructive pulmonary disease, unspecified: Secondary | ICD-10-CM | POA: Diagnosis not present

## 2021-09-07 DIAGNOSIS — M791 Myalgia, unspecified site: Secondary | ICD-10-CM | POA: Diagnosis not present

## 2021-09-07 DIAGNOSIS — J449 Chronic obstructive pulmonary disease, unspecified: Secondary | ICD-10-CM | POA: Diagnosis not present

## 2021-09-07 DIAGNOSIS — G894 Chronic pain syndrome: Secondary | ICD-10-CM | POA: Diagnosis not present

## 2021-09-08 DIAGNOSIS — J449 Chronic obstructive pulmonary disease, unspecified: Secondary | ICD-10-CM | POA: Diagnosis not present

## 2021-09-08 DIAGNOSIS — G894 Chronic pain syndrome: Secondary | ICD-10-CM | POA: Diagnosis not present

## 2021-09-08 DIAGNOSIS — M791 Myalgia, unspecified site: Secondary | ICD-10-CM | POA: Diagnosis not present

## 2021-09-09 DIAGNOSIS — G894 Chronic pain syndrome: Secondary | ICD-10-CM | POA: Diagnosis not present

## 2021-09-09 DIAGNOSIS — M791 Myalgia, unspecified site: Secondary | ICD-10-CM | POA: Diagnosis not present

## 2021-09-09 DIAGNOSIS — J449 Chronic obstructive pulmonary disease, unspecified: Secondary | ICD-10-CM | POA: Diagnosis not present

## 2021-09-10 DIAGNOSIS — J449 Chronic obstructive pulmonary disease, unspecified: Secondary | ICD-10-CM | POA: Diagnosis not present

## 2021-09-10 DIAGNOSIS — G894 Chronic pain syndrome: Secondary | ICD-10-CM | POA: Diagnosis not present

## 2021-09-10 DIAGNOSIS — M791 Myalgia, unspecified site: Secondary | ICD-10-CM | POA: Diagnosis not present

## 2021-09-11 DIAGNOSIS — M791 Myalgia, unspecified site: Secondary | ICD-10-CM | POA: Diagnosis not present

## 2021-09-11 DIAGNOSIS — R1310 Dysphagia, unspecified: Secondary | ICD-10-CM | POA: Diagnosis not present

## 2021-09-11 DIAGNOSIS — G894 Chronic pain syndrome: Secondary | ICD-10-CM | POA: Diagnosis not present

## 2021-09-11 DIAGNOSIS — G4733 Obstructive sleep apnea (adult) (pediatric): Secondary | ICD-10-CM | POA: Diagnosis not present

## 2021-09-11 DIAGNOSIS — Z0000 Encounter for general adult medical examination without abnormal findings: Secondary | ICD-10-CM | POA: Diagnosis not present

## 2021-09-11 DIAGNOSIS — Z1389 Encounter for screening for other disorder: Secondary | ICD-10-CM | POA: Diagnosis not present

## 2021-09-11 DIAGNOSIS — J449 Chronic obstructive pulmonary disease, unspecified: Secondary | ICD-10-CM | POA: Diagnosis not present

## 2021-09-11 DIAGNOSIS — Z1211 Encounter for screening for malignant neoplasm of colon: Secondary | ICD-10-CM | POA: Diagnosis not present

## 2021-09-11 DIAGNOSIS — Z720 Tobacco use: Secondary | ICD-10-CM | POA: Diagnosis not present

## 2021-09-12 DIAGNOSIS — J449 Chronic obstructive pulmonary disease, unspecified: Secondary | ICD-10-CM | POA: Diagnosis not present

## 2021-09-12 DIAGNOSIS — G894 Chronic pain syndrome: Secondary | ICD-10-CM | POA: Diagnosis not present

## 2021-09-12 DIAGNOSIS — M791 Myalgia, unspecified site: Secondary | ICD-10-CM | POA: Diagnosis not present

## 2021-09-13 DIAGNOSIS — G894 Chronic pain syndrome: Secondary | ICD-10-CM | POA: Diagnosis not present

## 2021-09-13 DIAGNOSIS — J449 Chronic obstructive pulmonary disease, unspecified: Secondary | ICD-10-CM | POA: Diagnosis not present

## 2021-09-13 DIAGNOSIS — M791 Myalgia, unspecified site: Secondary | ICD-10-CM | POA: Diagnosis not present

## 2021-09-14 DIAGNOSIS — G894 Chronic pain syndrome: Secondary | ICD-10-CM | POA: Diagnosis not present

## 2021-09-14 DIAGNOSIS — M791 Myalgia, unspecified site: Secondary | ICD-10-CM | POA: Diagnosis not present

## 2021-09-14 DIAGNOSIS — J449 Chronic obstructive pulmonary disease, unspecified: Secondary | ICD-10-CM | POA: Diagnosis not present

## 2021-09-15 DIAGNOSIS — J449 Chronic obstructive pulmonary disease, unspecified: Secondary | ICD-10-CM | POA: Diagnosis not present

## 2021-09-15 DIAGNOSIS — M791 Myalgia, unspecified site: Secondary | ICD-10-CM | POA: Diagnosis not present

## 2021-09-15 DIAGNOSIS — G894 Chronic pain syndrome: Secondary | ICD-10-CM | POA: Diagnosis not present

## 2021-09-16 DIAGNOSIS — G894 Chronic pain syndrome: Secondary | ICD-10-CM | POA: Diagnosis not present

## 2021-09-16 DIAGNOSIS — M791 Myalgia, unspecified site: Secondary | ICD-10-CM | POA: Diagnosis not present

## 2021-09-16 DIAGNOSIS — J449 Chronic obstructive pulmonary disease, unspecified: Secondary | ICD-10-CM | POA: Diagnosis not present

## 2021-09-17 DIAGNOSIS — G894 Chronic pain syndrome: Secondary | ICD-10-CM | POA: Diagnosis not present

## 2021-09-17 DIAGNOSIS — J449 Chronic obstructive pulmonary disease, unspecified: Secondary | ICD-10-CM | POA: Diagnosis not present

## 2021-09-17 DIAGNOSIS — M791 Myalgia, unspecified site: Secondary | ICD-10-CM | POA: Diagnosis not present

## 2021-09-18 DIAGNOSIS — J449 Chronic obstructive pulmonary disease, unspecified: Secondary | ICD-10-CM | POA: Diagnosis not present

## 2021-09-18 DIAGNOSIS — M791 Myalgia, unspecified site: Secondary | ICD-10-CM | POA: Diagnosis not present

## 2021-09-18 DIAGNOSIS — G894 Chronic pain syndrome: Secondary | ICD-10-CM | POA: Diagnosis not present

## 2021-09-19 DIAGNOSIS — M791 Myalgia, unspecified site: Secondary | ICD-10-CM | POA: Diagnosis not present

## 2021-09-19 DIAGNOSIS — J449 Chronic obstructive pulmonary disease, unspecified: Secondary | ICD-10-CM | POA: Diagnosis not present

## 2021-09-19 DIAGNOSIS — G894 Chronic pain syndrome: Secondary | ICD-10-CM | POA: Diagnosis not present

## 2021-09-20 DIAGNOSIS — J449 Chronic obstructive pulmonary disease, unspecified: Secondary | ICD-10-CM | POA: Diagnosis not present

## 2021-09-20 DIAGNOSIS — G894 Chronic pain syndrome: Secondary | ICD-10-CM | POA: Diagnosis not present

## 2021-09-20 DIAGNOSIS — M791 Myalgia, unspecified site: Secondary | ICD-10-CM | POA: Diagnosis not present

## 2021-09-21 DIAGNOSIS — G894 Chronic pain syndrome: Secondary | ICD-10-CM | POA: Diagnosis not present

## 2021-09-21 DIAGNOSIS — M791 Myalgia, unspecified site: Secondary | ICD-10-CM | POA: Diagnosis not present

## 2021-09-21 DIAGNOSIS — J449 Chronic obstructive pulmonary disease, unspecified: Secondary | ICD-10-CM | POA: Diagnosis not present

## 2021-09-22 DIAGNOSIS — M791 Myalgia, unspecified site: Secondary | ICD-10-CM | POA: Diagnosis not present

## 2021-09-22 DIAGNOSIS — J449 Chronic obstructive pulmonary disease, unspecified: Secondary | ICD-10-CM | POA: Diagnosis not present

## 2021-09-22 DIAGNOSIS — G894 Chronic pain syndrome: Secondary | ICD-10-CM | POA: Diagnosis not present

## 2021-09-23 DIAGNOSIS — G894 Chronic pain syndrome: Secondary | ICD-10-CM | POA: Diagnosis not present

## 2021-09-23 DIAGNOSIS — J449 Chronic obstructive pulmonary disease, unspecified: Secondary | ICD-10-CM | POA: Diagnosis not present

## 2021-09-23 DIAGNOSIS — M791 Myalgia, unspecified site: Secondary | ICD-10-CM | POA: Diagnosis not present

## 2021-09-24 DIAGNOSIS — G894 Chronic pain syndrome: Secondary | ICD-10-CM | POA: Diagnosis not present

## 2021-09-24 DIAGNOSIS — M791 Myalgia, unspecified site: Secondary | ICD-10-CM | POA: Diagnosis not present

## 2021-09-24 DIAGNOSIS — J449 Chronic obstructive pulmonary disease, unspecified: Secondary | ICD-10-CM | POA: Diagnosis not present

## 2021-09-25 DIAGNOSIS — M791 Myalgia, unspecified site: Secondary | ICD-10-CM | POA: Diagnosis not present

## 2021-09-25 DIAGNOSIS — G894 Chronic pain syndrome: Secondary | ICD-10-CM | POA: Diagnosis not present

## 2021-09-25 DIAGNOSIS — J449 Chronic obstructive pulmonary disease, unspecified: Secondary | ICD-10-CM | POA: Diagnosis not present

## 2021-09-26 DIAGNOSIS — J449 Chronic obstructive pulmonary disease, unspecified: Secondary | ICD-10-CM | POA: Diagnosis not present

## 2021-09-26 DIAGNOSIS — G894 Chronic pain syndrome: Secondary | ICD-10-CM | POA: Diagnosis not present

## 2021-09-26 DIAGNOSIS — M791 Myalgia, unspecified site: Secondary | ICD-10-CM | POA: Diagnosis not present

## 2021-09-27 DIAGNOSIS — M791 Myalgia, unspecified site: Secondary | ICD-10-CM | POA: Diagnosis not present

## 2021-09-27 DIAGNOSIS — G894 Chronic pain syndrome: Secondary | ICD-10-CM | POA: Diagnosis not present

## 2021-09-27 DIAGNOSIS — J449 Chronic obstructive pulmonary disease, unspecified: Secondary | ICD-10-CM | POA: Diagnosis not present

## 2021-09-28 DIAGNOSIS — G894 Chronic pain syndrome: Secondary | ICD-10-CM | POA: Diagnosis not present

## 2021-09-28 DIAGNOSIS — M791 Myalgia, unspecified site: Secondary | ICD-10-CM | POA: Diagnosis not present

## 2021-09-28 DIAGNOSIS — J449 Chronic obstructive pulmonary disease, unspecified: Secondary | ICD-10-CM | POA: Diagnosis not present

## 2021-09-29 DIAGNOSIS — M791 Myalgia, unspecified site: Secondary | ICD-10-CM | POA: Diagnosis not present

## 2021-09-29 DIAGNOSIS — J449 Chronic obstructive pulmonary disease, unspecified: Secondary | ICD-10-CM | POA: Diagnosis not present

## 2021-09-29 DIAGNOSIS — G894 Chronic pain syndrome: Secondary | ICD-10-CM | POA: Diagnosis not present

## 2021-09-30 DIAGNOSIS — M791 Myalgia, unspecified site: Secondary | ICD-10-CM | POA: Diagnosis not present

## 2021-09-30 DIAGNOSIS — J449 Chronic obstructive pulmonary disease, unspecified: Secondary | ICD-10-CM | POA: Diagnosis not present

## 2021-09-30 DIAGNOSIS — G894 Chronic pain syndrome: Secondary | ICD-10-CM | POA: Diagnosis not present

## 2021-10-01 DIAGNOSIS — R0602 Shortness of breath: Secondary | ICD-10-CM | POA: Diagnosis not present

## 2021-10-01 DIAGNOSIS — G4733 Obstructive sleep apnea (adult) (pediatric): Secondary | ICD-10-CM | POA: Diagnosis not present

## 2021-10-01 DIAGNOSIS — Z9989 Dependence on other enabling machines and devices: Secondary | ICD-10-CM | POA: Diagnosis not present

## 2021-10-01 DIAGNOSIS — J441 Chronic obstructive pulmonary disease with (acute) exacerbation: Secondary | ICD-10-CM | POA: Diagnosis not present

## 2021-10-01 DIAGNOSIS — J45901 Unspecified asthma with (acute) exacerbation: Secondary | ICD-10-CM | POA: Diagnosis not present

## 2021-10-01 DIAGNOSIS — G894 Chronic pain syndrome: Secondary | ICD-10-CM | POA: Diagnosis not present

## 2021-10-01 DIAGNOSIS — M791 Myalgia, unspecified site: Secondary | ICD-10-CM | POA: Diagnosis not present

## 2021-10-01 DIAGNOSIS — J449 Chronic obstructive pulmonary disease, unspecified: Secondary | ICD-10-CM | POA: Diagnosis not present

## 2021-10-02 DIAGNOSIS — G894 Chronic pain syndrome: Secondary | ICD-10-CM | POA: Diagnosis not present

## 2021-10-02 DIAGNOSIS — J449 Chronic obstructive pulmonary disease, unspecified: Secondary | ICD-10-CM | POA: Diagnosis not present

## 2021-10-02 DIAGNOSIS — G4733 Obstructive sleep apnea (adult) (pediatric): Secondary | ICD-10-CM | POA: Diagnosis not present

## 2021-10-02 DIAGNOSIS — M791 Myalgia, unspecified site: Secondary | ICD-10-CM | POA: Diagnosis not present

## 2021-10-03 DIAGNOSIS — M791 Myalgia, unspecified site: Secondary | ICD-10-CM | POA: Diagnosis not present

## 2021-10-03 DIAGNOSIS — J449 Chronic obstructive pulmonary disease, unspecified: Secondary | ICD-10-CM | POA: Diagnosis not present

## 2021-10-03 DIAGNOSIS — G894 Chronic pain syndrome: Secondary | ICD-10-CM | POA: Diagnosis not present

## 2021-10-04 DIAGNOSIS — J449 Chronic obstructive pulmonary disease, unspecified: Secondary | ICD-10-CM | POA: Diagnosis not present

## 2021-10-04 DIAGNOSIS — M791 Myalgia, unspecified site: Secondary | ICD-10-CM | POA: Diagnosis not present

## 2021-10-04 DIAGNOSIS — G894 Chronic pain syndrome: Secondary | ICD-10-CM | POA: Diagnosis not present

## 2021-10-05 DIAGNOSIS — R918 Other nonspecific abnormal finding of lung field: Secondary | ICD-10-CM | POA: Diagnosis not present

## 2021-10-05 DIAGNOSIS — D3501 Benign neoplasm of right adrenal gland: Secondary | ICD-10-CM | POA: Diagnosis not present

## 2021-10-05 DIAGNOSIS — J449 Chronic obstructive pulmonary disease, unspecified: Secondary | ICD-10-CM | POA: Diagnosis not present

## 2021-10-05 DIAGNOSIS — M791 Myalgia, unspecified site: Secondary | ICD-10-CM | POA: Diagnosis not present

## 2021-10-05 DIAGNOSIS — F17210 Nicotine dependence, cigarettes, uncomplicated: Secondary | ICD-10-CM | POA: Diagnosis not present

## 2021-10-05 DIAGNOSIS — G894 Chronic pain syndrome: Secondary | ICD-10-CM | POA: Diagnosis not present

## 2021-10-05 DIAGNOSIS — Z122 Encounter for screening for malignant neoplasm of respiratory organs: Secondary | ICD-10-CM | POA: Diagnosis not present

## 2021-10-06 DIAGNOSIS — J439 Emphysema, unspecified: Secondary | ICD-10-CM | POA: Diagnosis not present

## 2021-10-06 DIAGNOSIS — G894 Chronic pain syndrome: Secondary | ICD-10-CM | POA: Diagnosis not present

## 2021-10-06 DIAGNOSIS — M791 Myalgia, unspecified site: Secondary | ICD-10-CM | POA: Diagnosis not present

## 2021-10-06 DIAGNOSIS — J449 Chronic obstructive pulmonary disease, unspecified: Secondary | ICD-10-CM | POA: Diagnosis not present

## 2021-10-07 DIAGNOSIS — M791 Myalgia, unspecified site: Secondary | ICD-10-CM | POA: Diagnosis not present

## 2021-10-07 DIAGNOSIS — G894 Chronic pain syndrome: Secondary | ICD-10-CM | POA: Diagnosis not present

## 2021-10-07 DIAGNOSIS — J449 Chronic obstructive pulmonary disease, unspecified: Secondary | ICD-10-CM | POA: Diagnosis not present

## 2021-10-08 DIAGNOSIS — M791 Myalgia, unspecified site: Secondary | ICD-10-CM | POA: Diagnosis not present

## 2021-10-08 DIAGNOSIS — J449 Chronic obstructive pulmonary disease, unspecified: Secondary | ICD-10-CM | POA: Diagnosis not present

## 2021-10-08 DIAGNOSIS — G894 Chronic pain syndrome: Secondary | ICD-10-CM | POA: Diagnosis not present

## 2021-10-09 DIAGNOSIS — G894 Chronic pain syndrome: Secondary | ICD-10-CM | POA: Diagnosis not present

## 2021-10-09 DIAGNOSIS — D3501 Benign neoplasm of right adrenal gland: Secondary | ICD-10-CM | POA: Diagnosis not present

## 2021-10-09 DIAGNOSIS — J449 Chronic obstructive pulmonary disease, unspecified: Secondary | ICD-10-CM | POA: Diagnosis not present

## 2021-10-09 DIAGNOSIS — M791 Myalgia, unspecified site: Secondary | ICD-10-CM | POA: Diagnosis not present

## 2021-10-09 DIAGNOSIS — R918 Other nonspecific abnormal finding of lung field: Secondary | ICD-10-CM | POA: Diagnosis not present

## 2021-10-09 DIAGNOSIS — Z1389 Encounter for screening for other disorder: Secondary | ICD-10-CM | POA: Diagnosis not present

## 2021-11-05 DIAGNOSIS — J449 Chronic obstructive pulmonary disease, unspecified: Secondary | ICD-10-CM | POA: Diagnosis not present

## 2021-11-05 DIAGNOSIS — Z881 Allergy status to other antibiotic agents status: Secondary | ICD-10-CM | POA: Diagnosis not present

## 2021-11-05 DIAGNOSIS — J441 Chronic obstructive pulmonary disease with (acute) exacerbation: Secondary | ICD-10-CM | POA: Diagnosis not present

## 2021-11-05 DIAGNOSIS — Z886 Allergy status to analgesic agent status: Secondary | ICD-10-CM | POA: Diagnosis not present

## 2021-11-05 DIAGNOSIS — R1310 Dysphagia, unspecified: Secondary | ICD-10-CM | POA: Diagnosis not present

## 2021-11-05 DIAGNOSIS — Z885 Allergy status to narcotic agent status: Secondary | ICD-10-CM | POA: Diagnosis not present

## 2021-11-05 DIAGNOSIS — K219 Gastro-esophageal reflux disease without esophagitis: Secondary | ICD-10-CM | POA: Diagnosis not present

## 2021-11-05 DIAGNOSIS — Z880 Allergy status to penicillin: Secondary | ICD-10-CM | POA: Diagnosis not present

## 2021-11-05 DIAGNOSIS — J45901 Unspecified asthma with (acute) exacerbation: Secondary | ICD-10-CM | POA: Diagnosis not present

## 2021-11-06 DIAGNOSIS — J449 Chronic obstructive pulmonary disease, unspecified: Secondary | ICD-10-CM | POA: Diagnosis not present

## 2021-11-06 DIAGNOSIS — J439 Emphysema, unspecified: Secondary | ICD-10-CM | POA: Diagnosis not present

## 2021-11-16 DIAGNOSIS — Z1211 Encounter for screening for malignant neoplasm of colon: Secondary | ICD-10-CM | POA: Diagnosis not present

## 2021-11-16 DIAGNOSIS — Z0000 Encounter for general adult medical examination without abnormal findings: Secondary | ICD-10-CM | POA: Diagnosis not present

## 2021-11-16 DIAGNOSIS — G894 Chronic pain syndrome: Secondary | ICD-10-CM | POA: Diagnosis not present

## 2021-11-16 DIAGNOSIS — R1310 Dysphagia, unspecified: Secondary | ICD-10-CM | POA: Diagnosis not present

## 2021-11-16 DIAGNOSIS — Z1389 Encounter for screening for other disorder: Secondary | ICD-10-CM | POA: Diagnosis not present

## 2021-11-16 DIAGNOSIS — R197 Diarrhea, unspecified: Secondary | ICD-10-CM | POA: Diagnosis not present

## 2021-12-06 DIAGNOSIS — J449 Chronic obstructive pulmonary disease, unspecified: Secondary | ICD-10-CM | POA: Diagnosis not present

## 2021-12-06 DIAGNOSIS — J439 Emphysema, unspecified: Secondary | ICD-10-CM | POA: Diagnosis not present

## 2021-12-17 DIAGNOSIS — I739 Peripheral vascular disease, unspecified: Secondary | ICD-10-CM | POA: Diagnosis not present

## 2021-12-17 DIAGNOSIS — G4700 Insomnia, unspecified: Secondary | ICD-10-CM | POA: Diagnosis not present

## 2021-12-17 DIAGNOSIS — Z1389 Encounter for screening for other disorder: Secondary | ICD-10-CM | POA: Diagnosis not present

## 2021-12-17 DIAGNOSIS — G894 Chronic pain syndrome: Secondary | ICD-10-CM | POA: Diagnosis not present

## 2021-12-30 DIAGNOSIS — G4733 Obstructive sleep apnea (adult) (pediatric): Secondary | ICD-10-CM | POA: Diagnosis not present

## 2022-01-06 DIAGNOSIS — J439 Emphysema, unspecified: Secondary | ICD-10-CM | POA: Diagnosis not present

## 2022-01-06 DIAGNOSIS — J449 Chronic obstructive pulmonary disease, unspecified: Secondary | ICD-10-CM | POA: Diagnosis not present

## 2022-01-09 DIAGNOSIS — M7989 Other specified soft tissue disorders: Secondary | ICD-10-CM | POA: Diagnosis not present

## 2022-01-09 DIAGNOSIS — Z86010 Personal history of colonic polyps: Secondary | ICD-10-CM | POA: Diagnosis not present

## 2022-01-09 DIAGNOSIS — G4733 Obstructive sleep apnea (adult) (pediatric): Secondary | ICD-10-CM | POA: Diagnosis not present

## 2022-01-09 DIAGNOSIS — B353 Tinea pedis: Secondary | ICD-10-CM | POA: Diagnosis not present

## 2022-01-09 DIAGNOSIS — Z888 Allergy status to other drugs, medicaments and biological substances status: Secondary | ICD-10-CM | POA: Diagnosis not present

## 2022-01-09 DIAGNOSIS — E878 Other disorders of electrolyte and fluid balance, not elsewhere classified: Secondary | ICD-10-CM | POA: Diagnosis not present

## 2022-01-09 DIAGNOSIS — Z9049 Acquired absence of other specified parts of digestive tract: Secondary | ICD-10-CM | POA: Diagnosis not present

## 2022-01-09 DIAGNOSIS — K219 Gastro-esophageal reflux disease without esophagitis: Secondary | ICD-10-CM | POA: Diagnosis not present

## 2022-01-09 DIAGNOSIS — R079 Chest pain, unspecified: Secondary | ICD-10-CM | POA: Diagnosis not present

## 2022-01-09 DIAGNOSIS — J449 Chronic obstructive pulmonary disease, unspecified: Secondary | ICD-10-CM | POA: Diagnosis not present

## 2022-01-09 DIAGNOSIS — L680 Hirsutism: Secondary | ICD-10-CM | POA: Diagnosis not present

## 2022-01-09 DIAGNOSIS — E876 Hypokalemia: Secondary | ICD-10-CM | POA: Diagnosis not present

## 2022-01-09 DIAGNOSIS — G8929 Other chronic pain: Secondary | ICD-10-CM | POA: Diagnosis not present

## 2022-01-09 DIAGNOSIS — G629 Polyneuropathy, unspecified: Secondary | ICD-10-CM | POA: Diagnosis not present

## 2022-01-09 DIAGNOSIS — Z880 Allergy status to penicillin: Secondary | ICD-10-CM | POA: Diagnosis not present

## 2022-01-09 DIAGNOSIS — M7918 Myalgia, other site: Secondary | ICD-10-CM | POA: Diagnosis not present

## 2022-01-09 DIAGNOSIS — K529 Noninfective gastroenteritis and colitis, unspecified: Secondary | ICD-10-CM | POA: Diagnosis not present

## 2022-01-09 DIAGNOSIS — Z801 Family history of malignant neoplasm of trachea, bronchus and lung: Secondary | ICD-10-CM | POA: Diagnosis not present

## 2022-01-09 DIAGNOSIS — I890 Lymphedema, not elsewhere classified: Secondary | ICD-10-CM | POA: Diagnosis not present

## 2022-01-09 DIAGNOSIS — F17210 Nicotine dependence, cigarettes, uncomplicated: Secondary | ICD-10-CM | POA: Diagnosis not present

## 2022-01-09 DIAGNOSIS — Z885 Allergy status to narcotic agent status: Secondary | ICD-10-CM | POA: Diagnosis not present

## 2022-01-09 DIAGNOSIS — J9811 Atelectasis: Secondary | ICD-10-CM | POA: Diagnosis not present

## 2022-01-09 DIAGNOSIS — Z886 Allergy status to analgesic agent status: Secondary | ICD-10-CM | POA: Diagnosis not present

## 2022-01-09 DIAGNOSIS — Z881 Allergy status to other antibiotic agents status: Secondary | ICD-10-CM | POA: Diagnosis not present

## 2022-01-10 DIAGNOSIS — J9811 Atelectasis: Secondary | ICD-10-CM | POA: Diagnosis not present

## 2022-01-10 DIAGNOSIS — G4733 Obstructive sleep apnea (adult) (pediatric): Secondary | ICD-10-CM | POA: Diagnosis present

## 2022-01-10 DIAGNOSIS — R079 Chest pain, unspecified: Secondary | ICD-10-CM | POA: Diagnosis not present

## 2022-01-10 DIAGNOSIS — M7989 Other specified soft tissue disorders: Secondary | ICD-10-CM | POA: Diagnosis not present

## 2022-01-14 DIAGNOSIS — G894 Chronic pain syndrome: Secondary | ICD-10-CM | POA: Diagnosis not present

## 2022-02-05 DIAGNOSIS — J449 Chronic obstructive pulmonary disease, unspecified: Secondary | ICD-10-CM | POA: Diagnosis not present

## 2022-02-05 DIAGNOSIS — J439 Emphysema, unspecified: Secondary | ICD-10-CM | POA: Diagnosis not present

## 2022-02-08 DIAGNOSIS — F17210 Nicotine dependence, cigarettes, uncomplicated: Secondary | ICD-10-CM | POA: Diagnosis not present

## 2022-02-08 DIAGNOSIS — Z7951 Long term (current) use of inhaled steroids: Secondary | ICD-10-CM | POA: Diagnosis not present

## 2022-02-08 DIAGNOSIS — G8929 Other chronic pain: Secondary | ICD-10-CM | POA: Diagnosis not present

## 2022-02-08 DIAGNOSIS — M79605 Pain in left leg: Secondary | ICD-10-CM | POA: Diagnosis not present

## 2022-02-08 DIAGNOSIS — M7989 Other specified soft tissue disorders: Secondary | ICD-10-CM | POA: Diagnosis not present

## 2022-02-08 DIAGNOSIS — R200 Anesthesia of skin: Secondary | ICD-10-CM | POA: Diagnosis not present

## 2022-02-08 DIAGNOSIS — I739 Peripheral vascular disease, unspecified: Secondary | ICD-10-CM | POA: Diagnosis not present

## 2022-02-08 DIAGNOSIS — Z885 Allergy status to narcotic agent status: Secondary | ICD-10-CM | POA: Diagnosis not present

## 2022-02-08 DIAGNOSIS — Z880 Allergy status to penicillin: Secondary | ICD-10-CM | POA: Diagnosis not present

## 2022-02-08 DIAGNOSIS — R600 Localized edema: Secondary | ICD-10-CM | POA: Diagnosis not present

## 2022-02-08 DIAGNOSIS — J449 Chronic obstructive pulmonary disease, unspecified: Secondary | ICD-10-CM | POA: Diagnosis not present

## 2022-02-08 DIAGNOSIS — Z9981 Dependence on supplemental oxygen: Secondary | ICD-10-CM | POA: Diagnosis not present

## 2022-02-08 DIAGNOSIS — Z9109 Other allergy status, other than to drugs and biological substances: Secondary | ICD-10-CM | POA: Diagnosis not present

## 2022-02-08 DIAGNOSIS — G629 Polyneuropathy, unspecified: Secondary | ICD-10-CM | POA: Diagnosis not present

## 2022-02-08 DIAGNOSIS — I890 Lymphedema, not elsewhere classified: Secondary | ICD-10-CM | POA: Diagnosis not present

## 2022-02-18 DIAGNOSIS — G894 Chronic pain syndrome: Secondary | ICD-10-CM | POA: Diagnosis not present

## 2022-02-18 DIAGNOSIS — R55 Syncope and collapse: Secondary | ICD-10-CM | POA: Diagnosis not present

## 2022-02-18 DIAGNOSIS — R1310 Dysphagia, unspecified: Secondary | ICD-10-CM | POA: Diagnosis not present

## 2022-02-25 DIAGNOSIS — J441 Chronic obstructive pulmonary disease with (acute) exacerbation: Secondary | ICD-10-CM | POA: Diagnosis not present

## 2022-02-25 DIAGNOSIS — J45901 Unspecified asthma with (acute) exacerbation: Secondary | ICD-10-CM | POA: Diagnosis not present

## 2022-03-05 DIAGNOSIS — Z79899 Other long term (current) drug therapy: Secondary | ICD-10-CM | POA: Diagnosis not present

## 2022-03-05 DIAGNOSIS — M25512 Pain in left shoulder: Secondary | ICD-10-CM | POA: Diagnosis not present

## 2022-03-05 DIAGNOSIS — M79672 Pain in left foot: Secondary | ICD-10-CM | POA: Diagnosis not present

## 2022-03-05 DIAGNOSIS — G8929 Other chronic pain: Secondary | ICD-10-CM | POA: Diagnosis not present

## 2022-03-05 DIAGNOSIS — I890 Lymphedema, not elsewhere classified: Secondary | ICD-10-CM | POA: Diagnosis not present

## 2022-03-05 DIAGNOSIS — M79671 Pain in right foot: Secondary | ICD-10-CM | POA: Diagnosis not present

## 2022-03-05 DIAGNOSIS — M129 Arthropathy, unspecified: Secondary | ICD-10-CM | POA: Diagnosis not present

## 2022-03-05 DIAGNOSIS — M5135 Other intervertebral disc degeneration, thoracolumbar region: Secondary | ICD-10-CM | POA: Diagnosis not present

## 2022-03-05 DIAGNOSIS — M25572 Pain in left ankle and joints of left foot: Secondary | ICD-10-CM | POA: Diagnosis not present

## 2022-03-05 DIAGNOSIS — M542 Cervicalgia: Secondary | ICD-10-CM | POA: Diagnosis not present

## 2022-03-05 DIAGNOSIS — M25571 Pain in right ankle and joints of right foot: Secondary | ICD-10-CM | POA: Diagnosis not present

## 2022-05-06 ENCOUNTER — Emergency Department: Payer: 59

## 2022-05-06 ENCOUNTER — Encounter: Payer: Self-pay | Admitting: Radiology

## 2022-05-06 ENCOUNTER — Inpatient Hospital Stay
Admission: EM | Admit: 2022-05-06 | Discharge: 2022-05-08 | DRG: 190 | Disposition: A | Discharge status: Home Health | Payer: 59 | Attending: Obstetrics and Gynecology | Admitting: Obstetrics and Gynecology

## 2022-05-06 DIAGNOSIS — R0602 Shortness of breath: Secondary | ICD-10-CM | POA: Diagnosis present

## 2022-05-06 DIAGNOSIS — J441 Chronic obstructive pulmonary disease with (acute) exacerbation: Secondary | ICD-10-CM | POA: Diagnosis not present

## 2022-05-06 DIAGNOSIS — M79605 Pain in left leg: Secondary | ICD-10-CM | POA: Diagnosis present

## 2022-05-06 DIAGNOSIS — Z880 Allergy status to penicillin: Secondary | ICD-10-CM

## 2022-05-06 DIAGNOSIS — J449 Chronic obstructive pulmonary disease, unspecified: Secondary | ICD-10-CM | POA: Diagnosis present

## 2022-05-06 DIAGNOSIS — R0989 Other specified symptoms and signs involving the circulatory and respiratory systems: Secondary | ICD-10-CM | POA: Diagnosis present

## 2022-05-06 DIAGNOSIS — G8929 Other chronic pain: Secondary | ICD-10-CM | POA: Diagnosis present

## 2022-05-06 DIAGNOSIS — Z886 Allergy status to analgesic agent status: Secondary | ICD-10-CM

## 2022-05-06 DIAGNOSIS — F200 Paranoid schizophrenia: Secondary | ICD-10-CM

## 2022-05-06 DIAGNOSIS — Z87442 Personal history of urinary calculi: Secondary | ICD-10-CM

## 2022-05-06 DIAGNOSIS — E669 Obesity, unspecified: Secondary | ICD-10-CM | POA: Diagnosis present

## 2022-05-06 DIAGNOSIS — J189 Pneumonia, unspecified organism: Secondary | ICD-10-CM | POA: Diagnosis present

## 2022-05-06 DIAGNOSIS — Z6841 Body Mass Index (BMI) 40.0 and over, adult: Secondary | ICD-10-CM

## 2022-05-06 DIAGNOSIS — G43909 Migraine, unspecified, not intractable, without status migrainosus: Secondary | ICD-10-CM | POA: Diagnosis present

## 2022-05-06 DIAGNOSIS — Z79899 Other long term (current) drug therapy: Secondary | ICD-10-CM

## 2022-05-06 DIAGNOSIS — M79604 Pain in right leg: Secondary | ICD-10-CM | POA: Diagnosis present

## 2022-05-06 DIAGNOSIS — R451 Restlessness and agitation: Secondary | ICD-10-CM | POA: Diagnosis present

## 2022-05-06 DIAGNOSIS — Z882 Allergy status to sulfonamides status: Secondary | ICD-10-CM

## 2022-05-06 DIAGNOSIS — Z885 Allergy status to narcotic agent status: Secondary | ICD-10-CM

## 2022-05-06 DIAGNOSIS — J9621 Acute and chronic respiratory failure with hypoxia: Principal | ICD-10-CM

## 2022-05-06 DIAGNOSIS — J40 Bronchitis, not specified as acute or chronic: Secondary | ICD-10-CM | POA: Diagnosis present

## 2022-05-06 DIAGNOSIS — J440 Chronic obstructive pulmonary disease with acute lower respiratory infection: Secondary | ICD-10-CM | POA: Diagnosis present

## 2022-05-06 DIAGNOSIS — Z7952 Long term (current) use of systemic steroids: Secondary | ICD-10-CM

## 2022-05-06 DIAGNOSIS — G4733 Obstructive sleep apnea (adult) (pediatric): Secondary | ICD-10-CM | POA: Diagnosis present

## 2022-05-06 DIAGNOSIS — Z888 Allergy status to other drugs, medicaments and biological substances status: Secondary | ICD-10-CM

## 2022-05-06 DIAGNOSIS — F17200 Nicotine dependence, unspecified, uncomplicated: Secondary | ICD-10-CM | POA: Diagnosis present

## 2022-05-06 DIAGNOSIS — M7989 Other specified soft tissue disorders: Secondary | ICD-10-CM | POA: Insufficient documentation

## 2022-05-06 DIAGNOSIS — G40909 Epilepsy, unspecified, not intractable, without status epilepticus: Secondary | ICD-10-CM

## 2022-05-06 DIAGNOSIS — K219 Gastro-esophageal reflux disease without esophagitis: Secondary | ICD-10-CM | POA: Diagnosis present

## 2022-05-06 DIAGNOSIS — G2581 Restless legs syndrome: Secondary | ICD-10-CM | POA: Diagnosis present

## 2022-05-06 DIAGNOSIS — G629 Polyneuropathy, unspecified: Secondary | ICD-10-CM

## 2022-05-06 DIAGNOSIS — Z9981 Dependence on supplemental oxygen: Secondary | ICD-10-CM

## 2022-05-06 DIAGNOSIS — F209 Schizophrenia, unspecified: Secondary | ICD-10-CM | POA: Diagnosis present

## 2022-05-06 DIAGNOSIS — J9691 Respiratory failure, unspecified with hypoxia: Secondary | ICD-10-CM | POA: Diagnosis present

## 2022-05-06 DIAGNOSIS — Z1152 Encounter for screening for COVID-19: Secondary | ICD-10-CM

## 2022-05-06 LAB — CBC
HCT: 40.3 % (ref 36.0–46.0)
Hemoglobin: 13 g/dL (ref 12.0–15.0)
MCH: 30.7 pg (ref 26.0–34.0)
MCHC: 32.3 g/dL (ref 30.0–36.0)
MCV: 95.3 fL (ref 80.0–100.0)
Platelets: 249 10*3/uL (ref 150–400)
RBC: 4.23 MIL/uL (ref 3.87–5.11)
RDW: 13.5 % (ref 11.5–15.5)
WBC: 12.4 10*3/uL — ABNORMAL HIGH (ref 4.0–10.5)
nRBC: 0 % (ref 0.0–0.2)

## 2022-05-06 LAB — BASIC METABOLIC PANEL
Anion gap: 7 (ref 5–15)
BUN: 18 mg/dL (ref 6–20)
CO2: 28 mmol/L (ref 22–32)
Calcium: 8.9 mg/dL (ref 8.9–10.3)
Chloride: 103 mmol/L (ref 98–111)
Creatinine, Ser: 0.68 mg/dL (ref 0.44–1.00)
GFR, Estimated: >60 mL/min (ref >60)
Glucose, Bld: 103 mg/dL — ABNORMAL HIGH (ref 70–99)
Potassium: 4 mmol/L (ref 3.5–5.1)
Sodium: 138 mmol/L (ref 135–145)

## 2022-05-06 LAB — BRAIN NATRIURETIC PEPTIDE: B Natriuretic Peptide: 82.4 pg/mL (ref 0.0–100.0)

## 2022-05-06 LAB — TROPONIN I (HIGH SENSITIVITY)
Troponin I (High Sensitivity): 5 ng/L (ref <18)
Troponin I (High Sensitivity): 6 ng/L (ref <18)

## 2022-05-06 LAB — PROCALCITONIN: Procalcitonin: <0.1 ng/mL

## 2022-05-06 LAB — LACTIC ACID, PLASMA: Lactic Acid, Venous: 0.9 mmol/L (ref 0.5–1.9)

## 2022-05-06 MED ORDER — LORAZEPAM 1 MG PO TABS
1.0000 mg | ORAL_TABLET | Freq: Once | ORAL | Status: AC
  Administered 2022-05-06: 1 mg via ORAL
  Filled 2022-05-06: qty 1

## 2022-05-06 MED ORDER — IPRATROPIUM-ALBUTEROL 0.5-2.5 (3) MG/3ML IN SOLN
3.0000 mL | Freq: Once | RESPIRATORY_TRACT | Status: AC
  Administered 2022-05-06: 3 mL via RESPIRATORY_TRACT
  Filled 2022-05-06: qty 3

## 2022-05-06 MED ORDER — SODIUM CHLORIDE 0.9 % IV SOLN
500.0000 mg | Freq: Once | INTRAVENOUS | Status: AC
  Administered 2022-05-06: 500 mg via INTRAVENOUS
  Filled 2022-05-06: qty 5

## 2022-05-06 MED ORDER — METHYLPREDNISOLONE SODIUM SUCC 125 MG IJ SOLR
125.0000 mg | Freq: Once | INTRAMUSCULAR | Status: AC
  Administered 2022-05-06: 125 mg via INTRAVENOUS
  Filled 2022-05-06: qty 2

## 2022-05-06 MED ORDER — OXYCODONE HCL 5 MG PO TABS
5.0000 mg | ORAL_TABLET | Freq: Once | ORAL | Status: AC
  Administered 2022-05-06: 5 mg via ORAL
  Filled 2022-05-06: qty 1

## 2022-05-06 MED ORDER — SODIUM CHLORIDE 0.9 % IV SOLN
2.0000 g | Freq: Once | INTRAVENOUS | Status: AC
  Administered 2022-05-06: 2 g via INTRAVENOUS
  Filled 2022-05-06: qty 20

## 2022-05-06 MED ORDER — IOHEXOL 350 MG/ML SOLN
75.0000 mL | Freq: Once | INTRAVENOUS | Status: AC | PRN
  Administered 2022-05-06: 75 mL via INTRAVENOUS

## 2022-05-06 NOTE — Assessment & Plan Note (Addendum)
Pt has bronchitis and developing PNA. We will continue: Prn albuterol/ duonebs. Cont iv antibiotics, stop rocephin and cont azithromycin. Cont spiriva/ roflumilast.  Cont supplemental oxygen. Repeat chest imaging with ct chest per primary care to follow resolution of infiltrate and LAD.

## 2022-05-06 NOTE — Assessment & Plan Note (Signed)
Seizure precaution.  No AED in chart.  Once med rec is available resume AED therapy if any .

## 2022-05-06 NOTE — ED Notes (Signed)
Pt able to ambulate to restroom with standby assist

## 2022-05-06 NOTE — Assessment & Plan Note (Signed)
Lab Results  Component Value Date   CREATININE 0.68 05/06/2022   CREATININE 0.69 02/18/2019   CREATININE 0.71 08/31/2018  ABI ordered by EDMD. Suspect pt needs CTA aorta runoff to eval for limb ischemia. Will defer to am team to order as pt has already had contrast with CTA PE protocol earlier today.

## 2022-05-06 NOTE — ED Notes (Signed)
Bilateral pedal pulses present with pen doppler, marked with skin pen.

## 2022-05-06 NOTE — ED Provider Notes (Signed)
[facility] Provider Note    Event Date/Time   First MD Initiated Contact with Patient 05/06/22 1657     (approximate)   History   Chest Pain   HPI  Brandi Fowler is a 57 y.o. female  here with CC of SOB. Pt also here with left leg pain. Re: sob, pt has COPD and chronic hypoxia on 2L [place]. She reports that for th last several days, she has had progressively worsening cough, wheezing, and SOB. Over the last 24 hours this has acutely worsened and she has now begun to have purulent and blood-streaked sputum. She has had significant wheezing and dyspnea. She has also had persistent redness and swelling of her left leg. This is a somewhat more chronic issue and she has had recurrent ultrasounds and work up for this. Denies any trauma to the area.       Physical Exam   Triage Vital Signs: ED Triage Vitals  Enc Vitals Group     BP 05/06/22 1536 125/87     Pulse Rate 05/06/22 1536 91     Resp 05/06/22 1536 (!) 26     Temp 05/06/22 1536 97.9 F (36.6 C)     Temp Source 05/06/22 1536 Oral     SpO2 05/06/22 1536 96 %     Weight 05/06/22 1535 252 lb (114.3 kg)     Height --      Head Circumference --      Peak Flow --      Pain Score 05/06/22 1535 10     Pain Loc --      Pain Edu? --      Excl. in [place]? --     Most recent vital signs: Vitals:   05/06/22 1700 05/06/22 1939  BP: 127/65   Pulse: 90   Resp: (!) 25   Temp:  98 F (36.7 C)  SpO2: 99%      General: Awake, no distress.  CV:  Good peripheral perfusion. Mild tachycardia noted. Resp:  Increased WOB speaking in short sentences. Diffuse wheezing, diminished aeration. Abd:  No distention. No tenderness. Other:  LLE with asymmetric pitting edema and diffuse redness. 1+ DP pulses bilaterally.   ED Results / Procedures / Treatments   Labs (all labs ordered are listed, but only abnormal results are displayed) Labs Reviewed  BASIC METABOLIC PANEL - Abnormal; Notable for the following  components:      Result Value   Glucose, Bld 103 (*)    All other components within normal limits  CBC - Abnormal; Notable for the following components:   WBC 12.4 (*)    All other components within normal limits  CULTURE, BLOOD (ROUTINE X 2)  CULTURE, BLOOD (ROUTINE X 2)  BRAIN NATRIURETIC PEPTIDE  LACTIC ACID, PLASMA  LACTIC ACID, PLASMA  PROCALCITONIN  TROPONIN I (HIGH SENSITIVITY)  TROPONIN I (HIGH SENSITIVITY)     EKG Normal sinus rhythm, VR 89. PR 138, QRS 66, QTc 455. No acute ST elevations or depressions. No ischemia or infarct.   RADIOLOGY CXR: Patchy opacities LLL, concerning for PNA, increased interstitial markings centrally   I also independently reviewed and agree with radiologist interpretations.   PROCEDURES:  Critical Care performed: Yes, see critical care procedure note(s)  .Critical Care  Performed by: Duffy Bruce, MD Authorized by: Duffy Bruce, MD   Critical care provider statement:    Critical care time (minutes):  30   Critical care time was exclusive of:  Separately  billable procedures and treating other patients   Critical care was necessary to treat or prevent imminent or life-threatening deterioration of the following conditions:  Cardiac failure, circulatory failure and respiratory failure   Critical care was time spent personally by me on the following activities:  Development of treatment plan with patient or surrogate, discussions with consultants, evaluation of patient's response to treatment, examination of patient, ordering and review of laboratory studies, ordering and review of radiographic studies, ordering and performing treatments and interventions, pulse oximetry, re-evaluation of patient's condition and review of old [place] ED: Medications  cefTRIAXone (ROCEPHIN) 2 g in sodium chloride 0.9 % 100 mL IVPB (2 g Intravenous New Bag/Given 05/06/22 1938)  azithromycin (ZITHROMAX) 500 mg in sodium  chloride 0.9 % 250 mL IVPB (has no administration in time range)  methylPREDNISolone sodium succinate (SOLU-MEDROL) 125 mg/2 mL injection 125 mg (125 mg Intravenous Given 05/06/22 1829)  ipratropium-albuterol (DUONEB) 0.5-2.5 (3) MG/3ML nebulizer solution 3 mL (3 mLs Nebulization Given 05/06/22 1808)  ipratropium-albuterol (DUONEB) 0.5-2.5 (3) MG/3ML nebulizer solution 3 mL (3 mLs Nebulization Given 05/06/22 1808)  ipratropium-albuterol (DUONEB) 0.5-2.5 (3) MG/3ML nebulizer solution 3 mL (3 mLs Nebulization Given 05/06/22 1804)  LORazepam (ATIVAN) tablet 1 mg (1 mg Oral Given 05/06/22 1847)  oxyCODONE (Oxy IR/ROXICODONE) immediate release tablet 5 mg (5 mg Oral Given 05/06/22 1847)  iohexol (OMNIPAQUE) 350 MG/ML injection 75 mL (75 mLs Intravenous Contrast Given 05/06/22 1910)     IMPRESSION / MDM / [place] / ED COURSE  I reviewed the triage vital signs and the nursing notes.                              Differential diagnosis includes, but is not limited to, COPD exacerbation, CHF, PE, PNA, atelectasis, PTX, DVT LLE, cellulitis w/ sepsis  Patient's presentation is most consistent with acute presentation with potential threat to life or bodily function.  The patient is on the cardiac monitor to evaluate for evidence of arrhythmia and/or significant heart rate changes  57 yo F here with multiple complaints. Primary c/o SOB is likely from COPD exacerbation w/ PNA, with acute on chronic hypoxia. CXR shows possible PNA. IV Steroids, nebs given and empirix abx started for CAP. Re: leg pain/swelling - exam is highly concerning for DVT though she's had recurrent negative [place]. She has intact pulses distally on my assessment and has had negative duplex u/s in the past. DVT study ordered. CT Angio obtained for her SOB and leg sx and shows no PE.  Will admit for further management, consideration of vascular eval for her LLE. Pt in agreement with this plan. As mentioned, currently distal NV is  intact.    FINAL CLINICAL IMPRESSION(S) / ED DIAGNOSES   Final diagnoses:  Acute on chronic respiratory failure with hypoxia (HCC)  COPD exacerbation (HCC)  Left leg swelling     Rx / DC Orders   ED Discharge Orders     None        Note:  This document was prepared using Dragon voice recognition software and may include unintentional dictation errors.   Duffy Bruce, MD 05/06/22 2006

## 2022-05-06 NOTE — Assessment & Plan Note (Addendum)
PRN albuterol azelastine. Mild COPD exacerbation. Will continue solumedrol and transition to po prednisone.

## 2022-05-06 NOTE — Hospital Course (Signed)
Admission : A/c on c/h copd 2 l at baseline.  Hypoxic. Bronchitis. Left leg x months- inflamed warmth  Neg doppler Neg for cellulitis. Doppler pulses+.

## 2022-05-06 NOTE — Assessment & Plan Note (Addendum)
Suspect PAD.  Will defer to am team for contrast based CTA with aorta runoff angio.

## 2022-05-06 NOTE — ED Triage Notes (Signed)
Pt sts that she comes from the [facility]. Per pt she has been having chest pain with SOB and left leg swelling with redness. Per pt, the swelling is normal however the redness is not.

## 2022-05-06 NOTE — Assessment & Plan Note (Signed)
IV PPI. 

## 2022-05-06 NOTE — Assessment & Plan Note (Addendum)
Supplemental oxygen. SpO2: 95 % O2 Flow Rate (L/min): 3 L/min PRN albuterol.

## 2022-05-06 NOTE — H&P (Addendum)
History and Physical    Chief Complaint: Sob.    HISTORY OF PRESENT ILLNESS: Brandi Fowler is an 57 y.o. female seen in ed for SOB/ chest tightness and leg pain in left leg.  HPI is limited due to pt's speech . She is also reporting sob even on home o2 and worse with ambulation. She continue to smoke and is cutting down.   Pt has past medical history : Past Medical History:  Diagnosis Date   BRBPR (bright red blood per rectum)    Chronic diarrhea    Chronic kidney disease    Chronic myofascial pain    Chronic pain    COPD (chronic obstructive pulmonary disease) (HCC)    DDD (degenerative disc disease), cervical    DDD (degenerative disc disease), cervical    Depression    GERD (gastroesophageal reflux disease)    Headache    migraines   Kidney stones    Obesity, Class III, BMI 40-49.9 (morbid obesity) (HCC)    Schizophrenia (HCC)    Seizures (HCC)     Review of Systems  Constitutional:  Positive for fatigue.  Respiratory:  Positive for cough, chest tightness and shortness of breath.   Musculoskeletal:        Left leg pain.   All other systems reviewed and are negative.  Allergies  Allergen Reactions   Abilify [Aripiprazole] Swelling   Baclofen Swelling   Bromfenac    Buprenorphine    Buprenorphine-Naloxone Other (See Comments)   Bupropion    Ciprofloxacin    Ciprofloxacin Hcl Other (See Comments)   Clindamycin/Lincomycin    Clonazepam    Codeine    Colestipol    Doxepin    Etodolac    Fentanyl    Flexeril [Cyclobenzaprine]    Flurbiprofen    Gabapentin    Haloperidol     Other reaction(s): Headache   Hydrocodone    Ibuprofen Other (See Comments)    Bleeding in stomach   Indocin [Indomethacin]    Ketoprofen    Latuda [Lurasidone Hcl]    Lurasidone Swelling   Lyrica [Pregabalin]    Meloxicam    Methadone    Metronidazole    Mirtazapine    Nabumetone    Naproxen    Opana [Oxymorphone Hcl] Other (See Comments)    seizures   Oxaprozin     Oxycodone    Oxymorphone Other (See Comments)   Paroxetine    Penicillins Swelling   Quetiapine Other (See Comments)   Seroquel [Quetiapine Fumarate]    Sertraline Diarrhea   Suboxone [Buprenorphine Hcl-Naloxone Hcl] Other (See Comments)    seizures   Tolmetin    Tramadol    Trazodone Itching   Tylenol [Acetaminophen] Nausea And Vomiting   Valproic Acid Diarrhea   Zoloft [Sertraline Hcl] Nausea And Vomiting   Clindamycin Rash   Past Surgical History:  Procedure Laterality Date   CHOLECYSTECTOMY     COLONOSCOPY WITH PROPOFOL N/A 11/18/2014   Procedure: COLONOSCOPY WITH PROPOFOL;  Surgeon: Manya Silvas, MD;  Location: [facility] ENDOSCOPY;  Service: Endoscopy;  Laterality: N/A;   ESOPHAGOGASTRODUODENOSCOPY     KNEE SURGERY Left    polpectomy N/A    SPINE SURGERY     TUBAL LIGATION         MEDICATIONS: Current Outpatient Medications  Medication Instructions   albuterol (ACCUNEB) 1.25 MG/3ML nebulizer solution 1 ampule, Nebulization, Every 6 hours PRN   azelastine (ASTELIN) 0.1 % nasal spray 1 spray, Each Nare, 2 times daily, Use  in each nostril as directed   cyproheptadine (PERIACTIN) 4 mg, Oral, 3 times daily PRN   docusate sodium (COLACE) 100 mg, Oral, 2 times daily PRN   famotidine (PEPCID) 40 mg, Oral, 2 times daily   fluticasone (FLONASE) 50 MCG/ACT nasal spray 1 spray, Nasal, 2 times daily   haloperidol (HALDOL) 1 mg, Oral, Daily at bedtime   ipratropium-albuterol (DUONEB) 0.5-2.5 (3) MG/3ML SOLN 3 mLs, Nebulization   methocarbamol (ROBAXIN) 500-1,000 mg, Oral, Every 6 hours PRN   morphine (MS CONTIN) 15 mg, Oral, Every 12 hours   Multiple Vitamin (MULTIVITAMIN WITH MINERALS) TABS tablet 1 tablet, Oral, Daily   naloxone (NARCAN) nasal spray 4 mg/0.1 mL CALL 911. INSTILL 1 SPRAY IN ONE NOSTRIL. MAY REPEAT Q 2 TO 3 MINUTES IF SYMPTOMS OF AN OPIOID EMERGENCY PERSISTS. ALTERNATE NOSTRILS.   omega-3 acid ethyl esters (LOVAZA) 2 g, Oral, Daily   oxyCODONE (OXY IR/ROXICODONE)  10 mg, Oral, Every 6 hours   perphenazine (TRILAFON) 16 mg, Oral, 2 times daily   predniSONE (DELTASONE) 20 MG tablet Take 3 tabs daily for 4 days, Take 2 tabs daily for 4 days, Take 1 tab daily for 4 days, Take 0.5 tab daily x 4 days.   predniSONE (DELTASONE) 5 mg, Oral, Daily with breakfast   Roflumilast (DALIRESP) 250 MCG TABS Oral   rOPINIRole (REQUIP) 0.5 mg, Oral, Daily at bedtime   tiotropium (SPIRIVA) 18 MCG inhalation capsule Inhalation   tiZANidine (ZANAFLEX) 4 MG tablet TK 1 T PO BID PRN   traZODone (DESYREL) 50 MG tablet TK 1 TO 2 T PO HS   ED Course: Pt in Ed pt is A/O and afebrile. Vitals:   05/06/22 2005 05/06/22 2130 05/06/22 2200 05/06/22 2343  BP:  95/64 (!) 103/91   Pulse: (!) 101 (!) 25 94   Resp: 15 14 (!) 22   Temp:    98.3 F (36.8 C)  TempSrc:    Oral  SpO2: 95% 93% 95%   Weight:      Total I/O In: 350 [IV Piggyback:350] Out: -  SpO2: 95 % O2 Flow Rate (L/min): 3 L/min Blood work in ed shows: Normal BMP, wbc of 12.4 and imaging with ct showing bronchitis/ Developing PNA.  Results for orders placed or performed during the hospital encounter of 05/06/22 (from the past 24 hour(s))  Basic metabolic panel     Status: Abnormal   Collection Time: 05/06/22  3:38 PM  Result Value Ref Range   Sodium 138 135 - 145 mmol/L   Potassium 4.0 3.5 - 5.1 mmol/L   Chloride 103 98 - 111 mmol/L   CO2 28 22 - 32 mmol/L   Glucose, Bld 103 (H) 70 - 99 mg/dL   BUN 18 6 - 20 mg/dL   Creatinine, Ser 0.68 0.44 - 1.00 mg/dL   Calcium 8.9 8.9 - 10.3 mg/dL   GFR, Estimated >60 >60 mL/min   Anion gap 7 5 - 15  CBC     Status: Abnormal   Collection Time: 05/06/22  3:38 PM  Result Value Ref Range   WBC 12.4 (H) 4.0 - 10.5 K/uL   RBC 4.23 3.87 - 5.11 MIL/uL   Hemoglobin 13.0 12.0 - 15.0 g/dL   HCT 40.3 36.0 - 46.0 %   MCV 95.3 80.0 - 100.0 fL   MCH 30.7 26.0 - 34.0 pg   MCHC 32.3 30.0 - 36.0 g/dL   RDW 13.5 11.5 - 15.5 %   Platelets 249 150 - 400 K/uL  nRBC 0.0 0.0 - 0.2 %   Troponin I (High Sensitivity)     Status: None   Collection Time: 05/06/22  3:38 PM  Result Value Ref Range   Troponin I (High Sensitivity) 5 <18 ng/L  Troponin I (High Sensitivity)     Status: None   Collection Time: 05/06/22  6:30 PM  Result Value Ref Range   Troponin I (High Sensitivity) 6 <18 ng/L  Brain natriuretic peptide     Status: None   Collection Time: 05/06/22  6:30 PM  Result Value Ref Range   B Natriuretic Peptide 82.4 0.0 - 100.0 pg/mL  Lactic acid, plasma     Status: None   Collection Time: 05/06/22  6:30 PM  Result Value Ref Range   Lactic Acid, Venous 0.9 0.5 - 1.9 mmol/L  Procalcitonin     Status: None   Collection Time: 05/06/22  6:30 PM  Result Value Ref Range   Procalcitonin <0.10 ng/mL    Unresulted Labs (From admission, onward)     Start     Ordered   05/07/22 0013  Hemoglobin A1c  Once,   URGENT       Comments: To assess prior glycemic control    05/07/22 0015   05/07/22 0013  HIV Antibody (routine testing w rflx)  (HIV Antibody (Routine testing w reflex) panel)  Once,   URGENT        05/07/22 0015   05/06/22 1803  Blood culture (routine x 2)  BLOOD CULTURE X 2,   STAT      05/06/22 1802   05/06/22 1747  Lactic acid, plasma  Now then every 2 hours,   STAT      05/06/22 1746           Pt has received : Orders Placed This Encounter  Procedures   Critical Care    This order was created via procedure documentation    Standing Status:   Standing    Number of Occurrences:   1   Blood culture (routine x 2)    Standing Status:   Standing    Number of Occurrences:   2   DG Chest 2 View    If patient pregnant, contact provider.    Standing Status:   Standing    Number of Occurrences:   1    Order Specific Question:   Reason for Exam (SYMPTOM  OR DIAGNOSIS REQUIRED)    Answer:   CP, SOB    Order Specific Question:   Radiology Contrast Protocol - do NOT remove file path    Answer:    [email or web address]   CT Angio Chest PE W and/or Wo Contrast    Standing Status:   Standing    Number of Occurrences:   1    Order Specific Question:   Bypass Screening Labs?    Answer:   I, as the ordering provider, have weighed the risks vs. benefits for this patient and concluded this exam meets the requirements for medical necessity to bypass lab work needed prior to CT.    Order Specific Question:   Does the patient have a contrast media/X-ray dye allergy?    Answer:   No    Order Specific Question:   If indicated for the ordered procedure, I authorize the administration of contrast media per Radiology protocol    Answer:   Yes    Order Specific Question:   Is patient pregnant?    Answer:   No  Order Specific Question:   Radiology Contrast Protocol - do NOT remove file path    Answer:   [email or web address]   US Venous Img Lower Unilateral Left    Standing Status:   Standing    Number of Occurrences:   1    Order Specific Question:   Reason for Exam (SYMPTOM  OR DIAGNOSIS REQUIRED)    Answer:   Leg swelling, pain   Basic metabolic panel    Standing Status:   Standing    Number of Occurrences:   1   CBC    Standing Status:   Standing    Number of Occurrences:   1   Brain natriuretic peptide    Standing Status:   Standing    Number of Occurrences:   1   Lactic acid, plasma    Standing Status:   Standing    Number of Occurrences:   2   Procalcitonin    Standing Status:   Standing    Number of Occurrences:   1   Hemoglobin A1c    To assess prior glycemic control    Standing Status:   Standing    Number of Occurrences:   1   HIV Antibody (routine testing w rflx)    Standing Status:   Standing    Number of Occurrences:   1   Diet heart healthy/carb modified Room service appropriate? Yes; Fluid consistency: Thin    Standing Status:   Standing    Number of Occurrences:   1    Order  Specific Question:   Diet-HS Snack?    Answer:   Nothing    Order Specific Question:   Room service appropriate?    Answer:   Yes    Order Specific Question:   Fluid consistency:    Answer:   Thin   Document Height and Actual Weight    Use scales to weigh patient, not stated or estimated weight.    Standing Status:   Standing    Number of Occurrences:   1   Apply Diabetes Mellitus Care Plan    Standing Status:   Standing    Number of Occurrences:   1   STAT CBG when hypoglycemia is suspected. If treated, recheck every 15 minutes after each treatment until CBG >/= 70 mg/dl    Standing Status:   Standing    Number of Occurrences:   1   Refer to Hypoglycemia Protocol Sidebar Report for treatment of CBG < 70 mg/dl    Standing Status:   Standing    Number of Occurrences:   1   Refer to Sidebar Report:    Standing Status:   Standing    Number of Occurrences:   1   Apply Chronic Obstructive Pulmonary Disease Care Plan    Standing Status:   Standing    Number of Occurrences:   1   Provide Smoking / Tobacco cessation education.  Provide education material and information on community counseling.    Standing Status:   Standing    Number of Occurrences:   1   Cardiac Monitoring Continuous x 24 hours Indications for use: Other; other indications for use: COPD exacerbation    Standing Status:   Standing    Number of Occurrences:   1    Order Specific Question:   Indications for use:    Answer:   Other    Order Specific Question:   other indications for use:    Answer:  COPD exacerbation   Maintain IV access    Standing Status:   Standing    Number of Occurrences:   1   Vital signs    Standing Status:   Standing    Number of Occurrences:   1   Notify physician (specify)    Standing Status:   Standing    Number of Occurrences:   20    Order Specific Question:   Notify Physician    Answer:   for pulse less than 55 or greater than 120    Order Specific Question:   Notify Physician     Answer:   for respiratory rate less than 12 or greater than 25    Order Specific Question:   Notify Physician    Answer:   for temperature greater than 100.5 F    Order Specific Question:   Notify Physician    Answer:   for urinary output less than 30 mL/hr for four hours    Order Specific Question:   Notify Physician    Answer:   for systolic BP less than 90 or greater than 0000000, diastolic BP less than 60 or greater than 100   Progressive Mobility Protocol: No Restrictions    Standing Status:   Standing    Number of Occurrences:   1   If patient diabetic or glucose greater than 140 notify physician for Sliding Scale Insulin Orders    Standing Status:   Standing    Number of Occurrences:   20   Intake and Output    Standing Status:   Standing    Number of Occurrences:   1   Initiate Oral Care Protocol    Standing Status:   Standing    Number of Occurrences:   1   Initiate Carrier Fluid Protocol    Standing Status:   Standing    Number of Occurrences:   1   Nurse to provide smoking / tobacco cessation education    Standing Status:   Standing    Number of Occurrences:   1   RN may order General Admission PRN Orders utilizing "General Admission PRN medications" (through manage orders) for the following patient needs: allergy symptoms (Claritin), cold sores (Carmex), cough (Robitussin DM), eye irritation (Liquifilm Tears), hemorrhoids (Tucks), indigestion (Maalox), minor skin irritation (Hydrocortisone Cream), muscle pain (Ben Gay), nose irritation (saline nasal spray) and sore throat (Chloraseptic spray).    Standing Status:   Standing    Number of Occurrences:   [phone]   Full code    Standing Status:   Standing    Number of Occurrences:   1    Order Specific Question:   By:    Answer:   Other   Consult to hospitalist    Standing Status:   Standing    Number of Occurrences:   1    Order Specific Question:   Place call to:    Answer:   Hospitalist    Order Specific Question:   Reason  for Consult    Answer:   Admit   Nutritional services consult    Standing Status:   Standing    Number of Occurrences:   1    Order Specific Question:   Reason for Consult?    Answer:   nutritional goals   Consult to Transition of Care Team    Standing Status:   Standing    Number of Occurrences:   1    Order Specific Question:  Reason for Consult:    Answer:   Home Health / DME Needs    Order Specific Question:   Reason for Consult:    Answer:   Heart Failure / COPD Consult   Consult to respiratory care treatment    Standing Status:   Standing    Number of Occurrences:   1    Order Specific Question:   Reason for Consult?    Answer:   evaluation   OT eval and treat    Standing Status:   Standing    Number of Occurrences:   1   PT eval and treat    Standing Status:   Standing    Number of Occurrences:   1   Pulse oximetry check with vital signs    Standing Status:   Standing    Number of Occurrences:   1   Oxygen therapy Mode or (Route): Nasal cannula; Liters Per Minute: 2; Keep 02 saturation: greater than 92 %    Standing Status:   Standing    Number of Occurrences:   1    Order Specific Question:   Mode or (Route)    Answer:   Nasal cannula    Order Specific Question:   Liters Per Minute    Answer:   2    Order Specific Question:   Keep 02 saturation    Answer:   greater than 92 %   ED EKG    Standing Status:   Standing    Number of Occurrences:   1    Order Specific Question:   Reason for Exam    Answer:   Chest Pain   EKG 12-Lead    Standing Status:   Standing    Number of Occurrences:   1   Admit to Inpatient (patient's expected length of stay will be greater than 2 midnights or inpatient only procedure)    Standing Status:   Standing    Number of Occurrences:   1    Order Specific Question:   Hospital Area    Answer:   [facility] [[id number]]    Order Specific Question:   Level of Care    Answer:   Telemetry Medical [104]    Order Specific  Question:   Covid Evaluation    Answer:   Asymptomatic - no recent exposure (last 10 days) testing not required    Order Specific Question:   Diagnosis    Answer:   SOB (shortness of breath) [id number]    Order Specific Question:   Admitting Physician    Answer:   Cherylann Ratel    Order Specific Question:   Attending Physician    Answer:   Cherylann Ratel    Order Specific Question:   Certification:    Answer:   I certify this patient will need inpatient services for at least 2 midnights    Order Specific Question:   Estimated Length of Stay    Answer:   3   Aspiration precautions    Standing Status:   Standing    Number of Occurrences:   1   Fall precautions    Standing Status:   Standing    Number of Occurrences:   1   Seizure precautions    Standing Status:   Standing    Number of Occurrences:   1    Meds ordered this encounter  Medications   methylPREDNISolone sodium succinate (SOLU-MEDROL) 125 mg/2 mL injection  125 mg   ipratropium-albuterol (DUONEB) 0.5-2.5 (3) MG/3ML nebulizer solution 3 mL   ipratropium-albuterol (DUONEB) 0.5-2.5 (3) MG/3ML nebulizer solution 3 mL   ipratropium-albuterol (DUONEB) 0.5-2.5 (3) MG/3ML nebulizer solution 3 mL   cefTRIAXone (ROCEPHIN) 2 g in sodium chloride 0.9 % 100 mL IVPB    Order Specific Question:   Antibiotic Indication:    Answer:   CAP   azithromycin (ZITHROMAX) 500 mg in sodium chloride 0.9 % 250 mL IVPB   LORazepam (ATIVAN) tablet 1 mg   oxyCODONE (Oxy IR/ROXICODONE) immediate release tablet 5 mg   iohexol (OMNIPAQUE) 350 MG/ML injection 75 mL   azelastine (ASTELIN) 0.1 % nasal spray 1 spray    Use in each nostril as directed     famotidine (PEPCID) tablet 40 mg   fluticasone (FLONASE) 50 MCG/ACT nasal spray 1 spray   haloperidol (HALDOL) tablet 1 mg   ipratropium-albuterol (DUONEB) 0.5-2.5 (3) MG/3ML nebulizer solution 3 mL   oxyCODONE (Oxy IR/ROXICODONE) immediate release tablet 10 mg   tiotropium (SPIRIVA)  inhalation capsule ([facility] use ONLY) 18 mcg   FOLLOWED BY Linked Order Group    methylPREDNISolone sodium succinate (SOLU-MEDROL) 125 mg/2 mL injection 60 mg     IV methylprednisolone will be converted to either a q12h or q24h frequency with the same total daily dose (TDD).  Ordered Dose: 1 to 125 mg TDD; convert to: TDD q24h.  Ordered Dose: 126 to 250 mg TDD; convert to: TDD div q12h.  Ordered Dose: >250 mg TDD; DAW.    predniSONE (DELTASONE) tablet 40 mg   ipratropium-albuterol (DUONEB) 0.5-2.5 (3) MG/3ML nebulizer solution 3 mL   albuterol (PROVENTIL) (2.5 MG/3ML) 0.083% nebulizer solution 2.5 mg   sodium chloride flush (NS) 0.9 % injection 3 mL   lactated ringers infusion   docusate sodium (COLACE) capsule 100 mg   polyethylene glycol (MIRALAX / GLYCOLAX) packet 17 g   bisacodyl (DULCOLAX) EC tablet 5 mg   OR Linked Order Group    ondansetron (ZOFRAN) tablet 4 mg    ondansetron (ZOFRAN) injection 4 mg   guaiFENesin (MUCINEX) 12 hr tablet 600 mg   nicotine (NICODERM CQ - dosed in mg/24 hours) patch 14 mg   hydrALAZINE (APRESOLINE) injection 5 mg   DISCONTD: doxycycline (VIBRA-TABS) tablet 100 mg   heparin injection 5,000 Units   azithromycin (ZITHROMAX) 500 mg in sodium chloride 0.9 % 250 mL IVPB    Order Specific Question:   Antibiotic Indication:    Answer:   CAP     Admission Imaging : CT Angio Chest PE W and/or Wo Contrast. Result Date: 05/06/2022 CLINICAL DATA:  Chest pain and shortness of breath. Left leg swelling and redness. EXAM: CT ANGIOGRAPHY CHEST WITH CONTRAST TECHNIQUE: Multidetector CT imaging of the chest was performed using the standard protocol during bolus administration of intravenous contrast. Multiplanar CT image reconstructions and MIPs were obtained to evaluate the vascular anatomy. RADIATION DOSE REDUCTION: This exam was performed according to the departmental dose-optimization program which includes automated exposure control, adjustment of the mA and/or kV  according to patient size and/or use of iterative reconstruction technique. CONTRAST:  51m OMNIPAQUE IOHEXOL 350 MG/ML SOLN COMPARISON:  CT chest with contrast 12/31/2011 chest x-ray 05/06/2022 earlier FINDINGS: Cardiovascular: Diffuse breathing motion throughout the examination. This significantly limits evaluation pulmonary emboli. Nondiagnostic for small and peripheral emboli. No large or central embolus. There is some enlargement of the central pulmonary arteries. Please correlate for pulmonary artery hypertension. There is a bovine type aortic arch. Grossly normal  caliber thoracic aorta with mild vascular calcifications. Coronary artery calcifications are also noted. Please correlate for other coronary risk factors. The heart is nonenlarged. Trace pericardial fluid. Mediastinum/Nodes: Normal caliber thoracic esophagus. No specific abnormal lymph node enlargement present in the axillary regions. There are some enlarged mediastinal nodes. Example pretracheal today measuring 19 x 13 mm. In 2013 this same lymph node measured 13 by 8 mm. Additional enlarging nodes seen subcarinal, paratracheal. There also enlarged hilar nodes. On the right on series 4, image 59 measuring 3.0 1.5 cm. Left side inferiorly on series 4, image 71 measuring 15 by 17 mm. Lungs/Pleura: Diffuse breathing motion. Centrilobular emphysematous lung changes are identified. Dependent ground-glass. Mild interstitial thickening. There are areas of peribronchial thickening identified particularly in the lower lung zones and right upper lobe. There also some lung nodules identified today. Example subpleural lateral left lower lobe, new from previous measuring 9 x 5 mm on series 5, image 108. Right upper lobe series 5, image 61 measuring 11 by 5 mm. Some ill-defined semi-solid nodules medial left upper lobe on image 55 series 5 measuring up to 6 mm. Upper Abdomen: Fatty liver infiltration. Nodular thickening of the left adrenal gland. Discrete nodule  on the right is stable going back to 2013 consistent with an adenoma measuring 2.3 cm. Hounsfield unit of -20. calcifications seen at the edge of the imaging field involving the right renal parenchyma. Musculoskeletal: Fixation hardware seen along the lower cervical spine at the edge of the imaging field. Mild degenerative changes along the spine. Review of the MIP images confirms the above findings. IMPRESSION: Significant breathing motion limiting evaluation. Nondiagnostic for small peripheral emboli. No large or central embolus. Emphysematous lung changes. Enlarged pulmonary arteries. Please correlate for pulmonary artery hypertension. Scattered interstitial thickening with ground-glass in the lungs. Areas of bronchial wall thickening identified. There is also some small nodules which are new from 2013 measuring up to 11 mm. Lung-RADS 4A, suspicious. Follow up low-dose chest CT without contrast in 3 months (please use the following order, "CT CHEST LCS NODULE FOLLOW-UP W/O CM") is recommended. Alternatively, PET may be considered when there is a solid component 67m or larger. Several enlarged mediastinal and hilar nodes bilaterally. These could be reactive versus more aggressive process. Please correlate with any known history of more recent comparison study. Otherwise further workup is recommended when appropriate. Coronary artery calcifications. Please correlate for other coronary risk factors. Fatty liver infiltration. Adrenal adenomas. Electronically Signed   By: AJill SideM.D.   On: 05/06/2022 19:34   DG Chest 2 View Result Date: 05/06/2022 CLINICAL DATA:  Chest pain and shortness of breath EXAM: CHEST - 2 VIEW COMPARISON:  Chest x-ray 08/30/2018 FINDINGS: There are increased interstitial markings centrally in both lungs. There some patchy opacities in the left lung base. There is no pleural effusion or pneumothorax. The cardiomediastinal silhouette is within normal limits. Cervical spinal fusion plate  is present. IMPRESSION: 1. Patchy opacities in the left lung base, concerning for pneumonia. 2. Increased interstitial markings centrally in both lungs may reflect mild pulmonary edema. Electronically Signed   By: ARonney AstersM.D.   On: 05/06/2022 16:15    Physical Examination: Vitals:   05/06/22 2005 05/06/22 2130 05/06/22 2200 05/06/22 2343  BP:  95/64 (!) 103/91   Pulse: (!) 101 (!) 25 94   Temp:    98.3 F (36.8 C)  Resp: 15 14 (!) 22   Weight:      SpO2: 95% 93% 95%  TempSrc:    Oral   Physical Exam Vitals and nursing note reviewed.  Constitutional:      General: She is awake. She is not in acute distress.    Appearance: Normal appearance. She is obese. She is not ill-appearing, toxic-appearing or diaphoretic.     Interventions: Nasal cannula in place.  HENT:     Head: Normocephalic and atraumatic.     Right Ear: Hearing and external ear normal.     Left Ear: Hearing and external ear normal.     Nose: Nose normal. No nasal deformity.     Mouth/Throat:     Lips: Pink.     Mouth: Mucous membranes are moist.     Tongue: No lesions.     Pharynx: Oropharynx is clear.  Eyes:     Extraocular Movements: Extraocular movements intact.  Neck:     Vascular: No carotid bruit.  Cardiovascular:     Rate and Rhythm: Normal rate and regular rhythm.     Pulses:          Dorsalis pedis pulses are detected w/ Doppler on the right side and detected w/ Doppler on the left side.     Heart sounds: Murmur heard.  Pulmonary:     Effort: Pulmonary effort is normal.     Breath sounds: Examination of the right-lower field reveals wheezing. Examination of the left-lower field reveals wheezing. Wheezing present.  Abdominal:     General: Bowel sounds are normal. There is no distension.     Palpations: Abdomen is soft. There is no mass.     Tenderness: There is no abdominal tenderness. There is no guarding.     Hernia: No hernia is present.  Musculoskeletal:     Right lower leg: 1+ Edema  present.     Left lower leg: 1+ Edema present.       Legs:  Skin:    General: Skin is warm.     Findings: Erythema present.  Neurological:     General: No focal deficit present.     Mental Status: She is alert and oriented to person, place, and time.     Cranial Nerves: Cranial nerves 2-12 are intact.     Motor: Motor function is intact.  Psychiatric:        Attention and Perception: Attention normal.        Mood and Affect: Mood normal.        Speech: Speech normal.        Behavior: Behavior normal. Behavior is cooperative.        Cognition and Memory: Cognition normal.     Assessment and Plan: * Respiratory failure with hypoxia (HCC) Supplemental oxygen. SpO2: 95 % O2 Flow Rate (L/min): 3 L/min PRN albuterol.   CAP (community acquired pneumonia) Pt has bronchitis and developing PNA. We will continue: Prn albuterol/ duonebs. Cont iv antibiotics, stop rocephin and cont azithromycin. Cont spiriva/ roflumilast.  Cont supplemental oxygen. Repeat chest imaging with ct chest per primary care to follow resolution of infiltrate and LAD.    Restless legs syndrome Suspect PAD.  Will defer to am team for contrast based CTA with aorta runoff angio.    SOB (shortness of breath) SpO2: 95 % O2 Flow Rate (L/min): 3 L/min Pulse oximetry with vitals.   Absent pedal pulses Lab Results  Component Value Date   CREATININE 0.68 05/06/2022   CREATININE 0.69 02/18/2019   CREATININE 0.71 08/31/2018  ABI ordered by EDMD. Suspect pt needs CTA aorta runoff  to eval for limb ischemia. Will defer to am team to order as pt has already had contrast with CTA PE protocol earlier today.     GERD (gastroesophageal reflux disease) IV PPI.   Seizure disorder [facility]) Seizure precaution.  No AED in chart.  Once med rec is available resume AED therapy if any .   COPD (chronic obstructive pulmonary disease) (HCC) PRN albuterol azelastine. Mild COPD exacerbation. Will continue solumedrol and  transition to po prednisone.      DVT prophylaxis:  Heparin.   Code Status:  Full Code.    Family Communication:  Clayton,Dwayne (Significant other)  [phone] .   Disposition Plan:  Home.    Consults called:  None.   Admission status: Inpatient.      Unit/ Expected LOS: Med surg.    Para Skeans MD [facility]  6 PM- 2 AM. Please contact me via secure Chat 6 PM-2 AM. [phone]( Pager ) To contact the [facility] Attending or Consulting provider [place] or covering provider during after hours [place], for this patient.   Check the care team in [facility] and look for a) attending/consulting [facility] provider listed and b) the [facility] team listed Log into [email or web address] and use [place]'s universal password to access. If you do not have the password, please contact the hospital operator. Locate the [facility] provider you are looking for under [facility] and page to a number that you can be directly reached. If you still have difficulty reaching the provider, please page the [facility] (Director on Call) for the Hospitalists listed on amion for assistance. [email or web address] 05/07/2022, 12:21 AM

## 2022-05-07 ENCOUNTER — Encounter: Payer: Self-pay | Admitting: Internal Medicine

## 2022-05-07 ENCOUNTER — Other Ambulatory Visit: Payer: Self-pay

## 2022-05-07 DIAGNOSIS — J441 Chronic obstructive pulmonary disease with (acute) exacerbation: Secondary | ICD-10-CM | POA: Diagnosis not present

## 2022-05-07 DIAGNOSIS — Z1152 Encounter for screening for COVID-19: Secondary | ICD-10-CM | POA: Diagnosis not present

## 2022-05-07 DIAGNOSIS — G40909 Epilepsy, unspecified, not intractable, without status epilepticus: Secondary | ICD-10-CM | POA: Diagnosis present

## 2022-05-07 DIAGNOSIS — Z6841 Body Mass Index (BMI) 40.0 and over, adult: Secondary | ICD-10-CM | POA: Diagnosis not present

## 2022-05-07 DIAGNOSIS — G8929 Other chronic pain: Secondary | ICD-10-CM | POA: Diagnosis present

## 2022-05-07 DIAGNOSIS — Z885 Allergy status to narcotic agent status: Secondary | ICD-10-CM | POA: Diagnosis not present

## 2022-05-07 DIAGNOSIS — M79605 Pain in left leg: Secondary | ICD-10-CM | POA: Diagnosis present

## 2022-05-07 DIAGNOSIS — R451 Restlessness and agitation: Secondary | ICD-10-CM | POA: Diagnosis present

## 2022-05-07 DIAGNOSIS — G2581 Restless legs syndrome: Secondary | ICD-10-CM | POA: Diagnosis present

## 2022-05-07 DIAGNOSIS — R0602 Shortness of breath: Secondary | ICD-10-CM | POA: Diagnosis present

## 2022-05-07 DIAGNOSIS — Z886 Allergy status to analgesic agent status: Secondary | ICD-10-CM | POA: Diagnosis not present

## 2022-05-07 DIAGNOSIS — K219 Gastro-esophageal reflux disease without esophagitis: Secondary | ICD-10-CM | POA: Diagnosis present

## 2022-05-07 DIAGNOSIS — Z880 Allergy status to penicillin: Secondary | ICD-10-CM | POA: Diagnosis not present

## 2022-05-07 DIAGNOSIS — F17200 Nicotine dependence, unspecified, uncomplicated: Secondary | ICD-10-CM | POA: Diagnosis present

## 2022-05-07 DIAGNOSIS — F209 Schizophrenia, unspecified: Secondary | ICD-10-CM | POA: Diagnosis present

## 2022-05-07 DIAGNOSIS — J9621 Acute and chronic respiratory failure with hypoxia: Secondary | ICD-10-CM | POA: Diagnosis present

## 2022-05-07 DIAGNOSIS — Z888 Allergy status to other drugs, medicaments and biological substances status: Secondary | ICD-10-CM | POA: Diagnosis not present

## 2022-05-07 DIAGNOSIS — M79604 Pain in right leg: Secondary | ICD-10-CM | POA: Diagnosis present

## 2022-05-07 DIAGNOSIS — Z882 Allergy status to sulfonamides status: Secondary | ICD-10-CM | POA: Diagnosis not present

## 2022-05-07 DIAGNOSIS — Z9981 Dependence on supplemental oxygen: Secondary | ICD-10-CM | POA: Diagnosis not present

## 2022-05-07 DIAGNOSIS — G4733 Obstructive sleep apnea (adult) (pediatric): Secondary | ICD-10-CM | POA: Diagnosis present

## 2022-05-07 DIAGNOSIS — M7989 Other specified soft tissue disorders: Secondary | ICD-10-CM | POA: Diagnosis present

## 2022-05-07 DIAGNOSIS — J440 Chronic obstructive pulmonary disease with acute lower respiratory infection: Secondary | ICD-10-CM | POA: Diagnosis present

## 2022-05-07 DIAGNOSIS — Z87442 Personal history of urinary calculi: Secondary | ICD-10-CM | POA: Diagnosis not present

## 2022-05-07 LAB — BASIC METABOLIC PANEL
Anion gap: 11 (ref 5–15)
BUN: 26 mg/dL — ABNORMAL HIGH (ref 6–20)
CO2: 23 mmol/L (ref 22–32)
Calcium: 9.1 mg/dL (ref 8.9–10.3)
Chloride: 106 mmol/L (ref 98–111)
Creatinine, Ser: 1.08 mg/dL — ABNORMAL HIGH (ref 0.44–1.00)
GFR, Estimated: 60 mL/min — ABNORMAL LOW (ref >60)
Glucose, Bld: 277 mg/dL — ABNORMAL HIGH (ref 70–99)
Potassium: 3.8 mmol/L (ref 3.5–5.1)
Sodium: 140 mmol/L (ref 135–145)

## 2022-05-07 LAB — EXPECTORATED SPUTUM ASSESSMENT W GRAM STAIN, RFLX TO RESP C

## 2022-05-07 LAB — RESP PANEL BY RT-PCR (RSV, FLU A&B, COVID)  RVPGX2
Influenza A by PCR: NEGATIVE
Influenza B by PCR: NEGATIVE
Resp Syncytial Virus by PCR: NEGATIVE
SARS Coronavirus 2 by RT PCR: NEGATIVE

## 2022-05-07 LAB — HIV ANTIBODY (ROUTINE TESTING W REFLEX): HIV Screen 4th Generation wRfx: NONREACTIVE

## 2022-05-07 LAB — MAGNESIUM: Magnesium: 2.2 mg/dL (ref 1.7–2.4)

## 2022-05-07 MED ORDER — HALOPERIDOL 1 MG PO TABS
1.0000 mg | ORAL_TABLET | Freq: Every day | ORAL | Status: DC
  Filled 2022-05-07 (×2): qty 1

## 2022-05-07 MED ORDER — DOXYCYCLINE HYCLATE 100 MG PO TABS: 100.0000 mg | ORAL_TABLET | Freq: Two times a day (BID) | ORAL | Status: DC

## 2022-05-07 MED ORDER — HYDROXYZINE HCL 25 MG PO TABS: 25.0000 mg | ORAL_TABLET | Freq: Two times a day (BID) | ORAL | Status: DC | PRN

## 2022-05-07 MED ORDER — FAMOTIDINE 20 MG PO TABS
40.0000 mg | ORAL_TABLET | Freq: Two times a day (BID) | ORAL | Status: DC
  Administered 2022-05-07 – 2022-05-08 (×4): 40 mg via ORAL
  Filled 2022-05-07 (×4): qty 2

## 2022-05-07 MED ORDER — FLUTICASONE PROPIONATE 50 MCG/ACT NA SUSP: 1.0000 | Freq: Every day | NASAL | Status: DC | PRN

## 2022-05-07 MED ORDER — POLYETHYLENE GLYCOL 3350 17 G PO PACK: 17.0000 g | PACK | Freq: Every day | ORAL | Status: DC | PRN

## 2022-05-07 MED ORDER — ADULT MULTIVITAMIN W/MINERALS CH
1.0000 | ORAL_TABLET | Freq: Every day | ORAL | Status: DC
  Administered 2022-05-07 – 2022-05-08 (×2): 1 via ORAL
  Filled 2022-05-07 (×2): qty 1

## 2022-05-07 MED ORDER — AZELASTINE HCL 0.1 % NA SOLN: 1.0000 | Freq: Two times a day (BID) | NASAL | Status: DC | PRN

## 2022-05-07 MED ORDER — TIOTROPIUM BROMIDE MONOHYDRATE 18 MCG IN CAPS: 18.0000 ug | ORAL_CAPSULE | Freq: Every day | RESPIRATORY_TRACT | Status: DC

## 2022-05-07 MED ORDER — FUROSEMIDE 40 MG PO TABS
40.0000 mg | ORAL_TABLET | Freq: Every morning | ORAL | Status: DC
  Administered 2022-05-07 – 2022-05-08 (×2): 40 mg via ORAL
  Filled 2022-05-07 (×2): qty 1

## 2022-05-07 MED ORDER — TIZANIDINE HCL 2 MG PO TABS: 4.0000 mg | ORAL_TABLET | Freq: Four times a day (QID) | ORAL | Status: DC | PRN

## 2022-05-07 MED ORDER — HYDRALAZINE HCL 20 MG/ML IJ SOLN: 5.0000 mg | INTRAMUSCULAR | Status: DC | PRN

## 2022-05-07 MED ORDER — HEPARIN SODIUM (PORCINE) 5000 UNIT/ML IJ SOLN
5000.0000 [IU] | Freq: Three times a day (TID) | INTRAMUSCULAR | Status: DC
  Administered 2022-05-07 – 2022-05-08 (×5): 5000 [IU] via SUBCUTANEOUS
  Filled 2022-05-07 (×5): qty 1

## 2022-05-07 MED ORDER — METOPROLOL TARTRATE 5 MG/5ML IV SOLN
2.5000 mg | Freq: Once | INTRAVENOUS | Status: AC
  Administered 2022-05-07: 2.5 mg via INTRAVENOUS
  Filled 2022-05-07: qty 5

## 2022-05-07 MED ORDER — ZIPRASIDONE MESYLATE 20 MG IM SOLR
20.0000 mg | Freq: Four times a day (QID) | INTRAMUSCULAR | Status: DC | PRN
  Filled 2022-05-07: qty 20

## 2022-05-07 MED ORDER — SODIUM CHLORIDE 0.9 % IV SOLN: 500.0000 mg | INTRAVENOUS | Status: DC

## 2022-05-07 MED ORDER — BISACODYL 5 MG PO TBEC: 5.0000 mg | DELAYED_RELEASE_TABLET | Freq: Every day | ORAL | Status: DC | PRN

## 2022-05-07 MED ORDER — ALPRAZOLAM 0.5 MG PO TABS
0.5000 mg | ORAL_TABLET | Freq: Once | ORAL | Status: AC
  Administered 2022-05-07: 0.5 mg via ORAL
  Filled 2022-05-07: qty 1

## 2022-05-07 MED ORDER — IPRATROPIUM-ALBUTEROL 0.5-2.5 (3) MG/3ML IN SOLN: 3.0000 mL | Freq: Four times a day (QID) | RESPIRATORY_TRACT | Status: DC

## 2022-05-07 MED ORDER — IPRATROPIUM-ALBUTEROL 0.5-2.5 (3) MG/3ML IN SOLN
3.0000 mL | Freq: Four times a day (QID) | RESPIRATORY_TRACT | Status: DC
  Administered 2022-05-07 – 2022-05-08 (×5): 3 mL via RESPIRATORY_TRACT
  Filled 2022-05-07 (×6): qty 3

## 2022-05-07 MED ORDER — OXYCODONE HCL 5 MG PO TABS
10.0000 mg | ORAL_TABLET | Freq: Four times a day (QID) | ORAL | Status: DC
  Administered 2022-05-07 – 2022-05-08 (×6): 10 mg via ORAL
  Filled 2022-05-07 (×6): qty 2

## 2022-05-07 MED ORDER — FLUTICASONE PROPIONATE 50 MCG/ACT NA SUSP: 1.0000 | Freq: Every day | NASAL | Status: DC

## 2022-05-07 MED ORDER — SODIUM CHLORIDE 0.9 % IV SOLN
1.0000 g | INTRAVENOUS | Status: DC
  Administered 2022-05-07: 1 g via INTRAVENOUS
  Filled 2022-05-07 (×2): qty 10
  Filled 2022-05-07: qty 1

## 2022-05-07 MED ORDER — HALOPERIDOL LACTATE 5 MG/ML IJ SOLN
5.0000 mg | Freq: Four times a day (QID) | INTRAMUSCULAR | Status: DC | PRN
  Filled 2022-05-07: qty 1

## 2022-05-07 MED ORDER — ONDANSETRON HCL 4 MG/2ML IJ SOLN: 4.0000 mg | Freq: Four times a day (QID) | INTRAMUSCULAR | Status: DC | PRN

## 2022-05-07 MED ORDER — ENSURE ENLIVE PO LIQD: 237.0000 mL | Freq: Two times a day (BID) | ORAL | Status: DC

## 2022-05-07 MED ORDER — RISPERIDONE 1 MG PO TBDP
2.0000 mg | ORAL_TABLET | Freq: Every day | ORAL | Status: DC | PRN
  Administered 2022-05-07: 2 mg via ORAL
  Filled 2022-05-07 (×3): qty 2

## 2022-05-07 MED ORDER — ONDANSETRON HCL 4 MG PO TABS: 4.0000 mg | ORAL_TABLET | Freq: Four times a day (QID) | ORAL | Status: DC | PRN

## 2022-05-07 MED ORDER — ALBUTEROL SULFATE (2.5 MG/3ML) 0.083% IN NEBU: 2.5000 mg | INHALATION_SOLUTION | RESPIRATORY_TRACT | Status: DC | PRN

## 2022-05-07 MED ORDER — DOCUSATE SODIUM 100 MG PO CAPS
100.0000 mg | ORAL_CAPSULE | Freq: Two times a day (BID) | ORAL | Status: DC
  Administered 2022-05-07: 100 mg via ORAL
  Filled 2022-05-07 (×3): qty 1

## 2022-05-07 MED ORDER — AZELASTINE HCL 0.1 % NA SOLN: 1.0000 | Freq: Two times a day (BID) | NASAL | Status: DC

## 2022-05-07 MED ORDER — LACTATED RINGERS IV SOLN: INTRAVENOUS | Status: DC

## 2022-05-07 MED ORDER — GUAIFENESIN ER 600 MG PO TB12: 600.0000 mg | ORAL_TABLET | Freq: Two times a day (BID) | ORAL | Status: DC | PRN

## 2022-05-07 MED ORDER — PREDNISONE 20 MG PO TABS
40.0000 mg | ORAL_TABLET | Freq: Every day | ORAL | Status: DC
  Administered 2022-05-08: 40 mg via ORAL
  Filled 2022-05-07: qty 2

## 2022-05-07 MED ORDER — ROPINIROLE HCL 1 MG PO TABS
0.5000 mg | ORAL_TABLET | Freq: Every day | ORAL | Status: DC
  Administered 2022-05-07: 0.5 mg via ORAL
  Filled 2022-05-07: qty 1

## 2022-05-07 MED ORDER — SODIUM CHLORIDE 0.9% FLUSH
3.0000 mL | Freq: Two times a day (BID) | INTRAVENOUS | Status: DC
  Administered 2022-05-07 – 2022-05-08 (×2): 3 mL via INTRAVENOUS

## 2022-05-07 MED ORDER — METHYLPREDNISOLONE SODIUM SUCC 125 MG IJ SOLR
60.0000 mg | Freq: Two times a day (BID) | INTRAMUSCULAR | Status: AC
  Administered 2022-05-07 (×2): 60 mg via INTRAVENOUS
  Filled 2022-05-07 (×2): qty 2

## 2022-05-07 MED ORDER — NICOTINE 14 MG/24HR TD PT24
14.0000 mg | MEDICATED_PATCH | Freq: Every day | TRANSDERMAL | Status: DC
  Filled 2022-05-07: qty 1

## 2022-05-07 NOTE — ED Notes (Signed)
Dr. Posey Pronto notified pt does not have admit orders at this time. Message sent via secure chat.

## 2022-05-07 NOTE — Progress Notes (Signed)
Patient arrived to room, and reports that she hears voices and she needs to go outside to calm down. Per [facility] hospital policy will not allow this. Notified MD of increased agitation.

## 2022-05-07 NOTE — Assessment & Plan Note (Addendum)
SpO2: 95 % O2 Flow Rate (L/min): 3 L/min Pulse oximetry with vitals.

## 2022-05-07 NOTE — Progress Notes (Addendum)
PROGRESS NOTE    Brandi Fowler  [id number] DOB: 1965-05-21 DOA: 05/06/2022 PCP: System, Provider Not In  Outpatient Specialists: pulm, vascular    Brief Narrative:   From admission h and p by dr. Posey Pronto:  Brandi Fowler Face is an 57 y.o. female seen in ed for SOB/ chest tightness and leg pain in left leg.  HPI is limited due to pt's speech . She is also reporting sob even on home o2 and worse with ambulation. She continue to smoke and is cutting down.     Assessment & Plan:   Principal Problem:   Respiratory failure with hypoxia ([place]) Active Problems:   CAP (community acquired pneumonia)   Restless legs syndrome   COPD (chronic obstructive pulmonary disease) (HCC)   Seizure disorder (HCC)   Schizophrenia (HCC)   GERD (gastroesophageal reflux disease)   Absent pedal pulses   SOB (shortness of breath)   Obesity   Obstructive sleep apnea syndrome  # COPD with acute exacerbation Dyspnea, sputum production. Does not appear to have pneumonia. CTA neg for large PE. Bnp normal and normal tte 11/23 - continue ceftriaxone, stop azithromycin - sputum for culture - check covid/flu/rsv - continue steroids - duonebs scheduled, albuterol prn  # Acute on chronic respiratory failure O2 appropriate on home o2 but more dyspneic than usua - continue o2 - pt eval  # Left leg pain, erythema # Lower extremity edema Extensive outpatient w/u with several venous dopplers neg for DVT (and negative again on presentation here). [facility]. [facility]/24 where they noted venous duplex to assess for obstruction proximal to the inguinal ligament was also normal. Has had several months hyperemia in that leg and procal is quite low, making infection less likely. Patient complains of pain and tingling in that leg, could this be secondary to a neuropathy? Normal bnp and no sig dysfunction seen on 11/23 TTE.  - will discuss w/ neuro - cont home lasix for  now  # Smoker - patch  # Lung-rads 4a nodule - needs 3 mo f/u imaging outpt  # Seizure disorder - doesn't appear to be on anti-epileptic  # Schizophrenia - cont home haldol  # Chronic pain - cont home oxy  # Obesity Noted  # Restless leg - cont home requip  # OSA - cpap qhs    DVT prophylaxis: heparin Code Status: full Family Communication: none @ bedside  Level of care: Telemetry Medical Status is: Inpatient Remains inpatient appropriate because: severity of illness    Consultants:  none  Procedures: none  Antimicrobials:  ceftriaxone    Subjective: Cough and sob, onging leg pain  Objective: Vitals:   05/06/22 2343 05/07/22 0015 05/07/22 0134 05/07/22 0821  BP:  (!) 156/94    Pulse:  (!) 110  (!) 108  Resp:  (!) 23 (!) 24 (!) 23  Temp: 98.3 F (36.8 C)  98 F (36.7 C)   TempSrc: Oral  Oral   SpO2:  94%  92%  Weight:        Intake/Output Summary (Last 24 hours) at 05/07/2022 0841 Last data filed at 05/06/2022 2204 Gross per 24 hour  Intake 350 ml  Output --  Net 350 ml   Filed Weights   05/06/22 1535  Weight: 114.3 kg    Examination:  General exam: Appears dissheveled Respiratory system: scattered rhonchi, few wheeze Cardiovascular system: S1 & S2 heard, RRR. No JVD, murmurs, rubs, gallops or clicks. Gastrointestinal system:  Abdomen is obese, soft and nontender. No organomegaly or masses felt. Normal bowel sounds heard. Central nervous system: Alert and oriented. No focal neurological deficits. Extremities: Symmetric 5 x 5 power. Skin: hyperemia left leg. Tender to touch both legs. warm Psychiatry: bit scattered in her thinking    Data Reviewed: I have personally reviewed following labs and imaging studies  CBC: Recent Labs  Lab 05/06/22 1538  WBC 12.4*  HGB 13.0  HCT 40.3  MCV 95.3  PLT 0000000   Basic Metabolic Panel: Recent Labs  Lab 05/06/22 1538  NA 138  K 4.0  CL 103  CO2 28  GLUCOSE 103*  BUN 18   CREATININE 0.68  CALCIUM 8.9   GFR: CrCl cannot be calculated (Unknown ideal weight.). Liver Function Tests: No results for input(s): "AST", "ALT", "ALKPHOS", "BILITOT", "PROT", "ALBUMIN" in the last 168 hours. No results for input(s): "LIPASE", "AMYLASE" in the last 168 hours. No results for input(s): "AMMONIA" in the last 168 hours. Coagulation Profile: No results for input(s): "INR", "PROTIME" in the last 168 hours. Cardiac Enzymes: No results for input(s): "CKTOTAL", "CKMB", "CKMBINDEX", "TROPONINI" in the last 168 hours. BNP (last 3 results) No results for input(s): "PROBNP" in the last 8760 hours. HbA1C: No results for input(s): "HGBA1C" in the last 72 hours. CBG: No results for input(s): "GLUCAP" in the last 168 hours. Lipid Profile: No results for input(s): "CHOL", "HDL", "LDLCALC", "TRIG", "CHOLHDL", "LDLDIRECT" in the last 72 hours. Thyroid Function Tests: No results for input(s): "TSH", "T4TOTAL", "FREET4", "T3FREE", "THYROIDAB" in the last 72 hours. Anemia Panel: No results for input(s): "VITAMINB12", "FOLATE", "FERRITIN", "TIBC", "IRON", "RETICCTPCT" in the last 72 hours. Urine analysis:    Component Value Date/Time   COLORURINE Yellow 01/25/2012 1944   APPEARANCEUR Cloudy 01/25/2012 1944   LABSPEC 1.024 01/25/2012 1944   PHURINE 5.0 01/25/2012 1944   GLUCOSEU Negative 01/25/2012 1944   HGBUR Negative 01/25/2012 1944   BILIRUBINUR Negative 01/25/2012 1944   KETONESUR Negative 01/25/2012 1944   PROTEINUR Negative 01/25/2012 1944   NITRITE Positive 01/25/2012 1944   LEUKOCYTESUR Negative 01/25/2012 1944   Sepsis Labs: '@LABRCNTIP'$ (procalcitonin:4,lacticidven:4)  ) Recent Results (from the past 240 hour(s))  Blood culture (routine x 2)     Status: None (Preliminary result)   Collection Time: 05/06/22  6:30 PM   Specimen: BLOOD  Result Value Ref Range Status   Specimen Description BLOOD LEFT FOREARM  Final   Special Requests   Final    BOTTLES DRAWN AEROBIC  AND ANAEROBIC Blood Culture results may not be optimal due to an excessive volume of blood received in culture bottles   Culture   Final    NO GROWTH < 12 HOURS Performed at [facility], [address] [place], [place] [address]    Report Status PENDING  Incomplete  Blood culture (routine x 2)     Status: None (Preliminary result)   Collection Time: 05/06/22  6:56 PM   Specimen: BLOOD  Result Value Ref Range Status   Specimen Description BLOOD LEFT ARM  Final   Special Requests   Final    BOTTLES DRAWN AEROBIC AND ANAEROBIC Blood Culture adequate volume   Culture   Final    NO GROWTH < 12 HOURS Performed at [facility], [address] [place], [place] [address]    Report Status PENDING  Incomplete         Radiology Studies: US Venous Img Lower Unilateral Left  Result Date: 05/06/2022 CLINICAL DATA:  Left leg  swelling and pain EXAM: LEFT LOWER EXTREMITY VENOUS DOPPLER ULTRASOUND TECHNIQUE: Gray-scale sonography with compression, as well as color and duplex ultrasound, were performed to evaluate the deep venous system(s) from the level of the common femoral vein through the popliteal and proximal calf veins. COMPARISON:  None Available. FINDINGS: VENOUS Normal compressibility of the common femoral, superficial femoral, and popliteal veins, as well as the visualized calf veins. Visualized portions of profunda femoral vein and great saphenous vein unremarkable. No filling defects to suggest DVT on grayscale or color Doppler imaging. Doppler waveforms show normal direction of venous flow, normal respiratory plasticity and response to augmentation. Limited views of the contralateral common femoral vein are unremarkable. OTHER None. Limitations: none IMPRESSION: Negative. Electronically Signed   By: Fidela Salisbury M.D.   On: 05/06/2022 21:36   CT Angio Chest PE W and/or Wo Contrast  Result Date: 05/06/2022 CLINICAL DATA:  Chest pain and shortness of breath. Left leg  swelling and redness. EXAM: CT ANGIOGRAPHY CHEST WITH CONTRAST TECHNIQUE: Multidetector CT imaging of the chest was performed using the standard protocol during bolus administration of intravenous contrast. Multiplanar CT image reconstructions and MIPs were obtained to evaluate the vascular anatomy. RADIATION DOSE REDUCTION: This exam was performed according to the departmental dose-optimization program which includes automated exposure control, adjustment of the mA and/or kV according to patient size and/or use of iterative reconstruction technique. CONTRAST:  34m OMNIPAQUE IOHEXOL 350 MG/ML SOLN COMPARISON:  CT chest with contrast 12/31/2011 chest x-ray 05/06/2022 earlier FINDINGS: Cardiovascular: Diffuse breathing motion throughout the examination. This significantly limits evaluation pulmonary emboli. Nondiagnostic for small and peripheral emboli. No large or central embolus. There is some enlargement of the central pulmonary arteries. Please correlate for pulmonary artery hypertension. There is a bovine type aortic arch. Grossly normal caliber thoracic aorta with mild vascular calcifications. Coronary artery calcifications are also noted. Please correlate for other coronary risk factors. The heart is nonenlarged. Trace pericardial fluid. Mediastinum/Nodes: Normal caliber thoracic esophagus. No specific abnormal lymph node enlargement present in the axillary regions. There are some enlarged mediastinal nodes. Example pretracheal today measuring 19 x 13 mm. In 2013 this same lymph node measured 13 by 8 mm. Additional enlarging nodes seen subcarinal, paratracheal. There also enlarged hilar nodes. On the right on series 4, image 59 measuring 3.0 1.5 cm. Left side inferiorly on series 4, image 71 measuring 15 by 17 mm. Lungs/Pleura: Diffuse breathing motion. Centrilobular emphysematous lung changes are identified. Dependent ground-glass. Mild interstitial thickening. There are areas of peribronchial thickening  identified particularly in the lower lung zones and right upper lobe. There also some lung nodules identified today. Example subpleural lateral left lower lobe, new from previous measuring 9 x 5 mm on series 5, image 108. Right upper lobe series 5, image 61 measuring 11 by 5 mm. Some ill-defined semi-solid nodules medial left upper lobe on image 55 series 5 measuring up to 6 mm. Upper Abdomen: Fatty liver infiltration. Nodular thickening of the left adrenal gland. Discrete nodule on the right is stable going back to 2013 consistent with an adenoma measuring 2.3 cm. Hounsfield unit of -20. calcifications seen at the edge of the imaging field involving the right renal parenchyma. Musculoskeletal: Fixation hardware seen along the lower cervical spine at the edge of the imaging field. Mild degenerative changes along the spine. Review of the MIP images confirms the above findings. IMPRESSION: Significant breathing motion limiting evaluation. Nondiagnostic for small peripheral emboli. No large or central embolus. Emphysematous lung changes. Enlarged pulmonary arteries.  Please correlate for pulmonary artery hypertension. Scattered interstitial thickening with ground-glass in the lungs. Areas of bronchial wall thickening identified. There is also some small nodules which are new from 2013 measuring up to 11 mm. Lung-RADS 4A, suspicious. Follow up low-dose chest CT without contrast in 3 months (please use the following order, "CT CHEST LCS NODULE FOLLOW-UP W/O CM") is recommended. Alternatively, PET may be considered when there is a solid component 17m or larger. Several enlarged mediastinal and hilar nodes bilaterally. These could be reactive versus more aggressive process. Please correlate with any known history of more recent comparison study. Otherwise further workup is recommended when appropriate. Coronary artery calcifications. Please correlate for other coronary risk factors. Fatty liver infiltration. Adrenal  adenomas. Electronically Signed   By: AJill SideM.D.   On: 05/06/2022 19:34   DG Chest 2 View  Result Date: 05/06/2022 CLINICAL DATA:  Chest pain and shortness of breath EXAM: CHEST - 2 VIEW COMPARISON:  Chest x-ray 08/30/2018 FINDINGS: There are increased interstitial markings centrally in both lungs. There some patchy opacities in the left lung base. There is no pleural effusion or pneumothorax. The cardiomediastinal silhouette is within normal limits. Cervical spinal fusion plate is present. IMPRESSION: 1. Patchy opacities in the left lung base, concerning for pneumonia. 2. Increased interstitial markings centrally in both lungs may reflect mild pulmonary edema. Electronically Signed   By: ARonney AstersM.D.   On: 05/06/2022 16:15        Scheduled Meds:  docusate sodium  100 mg Oral BID   famotidine  40 mg Oral BID   haloperidol  1 mg Oral QHS   heparin  5,000 Units Subcutaneous Q8H   ipratropium-albuterol  3 mL Nebulization Q6H   methylPREDNISolone (SOLU-MEDROL) injection  60 mg Intravenous Q12H   Followed by   [Derrill MemoON 05/08/2022] predniSONE  40 mg Oral Q breakfast   nicotine  14 mg Transdermal Daily   oxyCODONE  10 mg Oral Q6H   sodium chloride flush  3 mL Intravenous Q12H   [START ON 05/08/2022] tiotropium  18 mcg Inhalation Daily   Continuous Infusions:  azithromycin     lactated ringers 50 mL/hr at 05/07/22 0112     LOS: 0 days     NDesma Maxim MD [facility]   If 7PM-7AM, please contact night-coverage [email or web address] Password [facility]3/03/2022, 8:41 AM

## 2022-05-07 NOTE — Progress Notes (Signed)
Brandi Fowler reports acute episode of shortness of breath, increasing anxiety and chest tightness. Vital signs obtained, blood pressure 165/94 mmHg, MAP 115, RR of 2, and HR of 121 BPM. MEWS score up to 3. Charge nurse aware, Hospitalist on call responded to bedside. Lungs clear to auscultation. Chest discomfort is described as "elephant sitting on my chest". Metoprolol 12.5 mg administered intravenously. In addition, Alprazolam 0.5 mg PO also administered. Stat EKG ordered and carried out.   Update at 2230: Patient resting well. Appears to have calmed and settled down, resting HR on 100-105 BPM. Non-toxic appearing. Reports medicines administered helped her acute symptoms.

## 2022-05-07 NOTE — Evaluation (Signed)
Physical Therapy Evaluation Patient Details Name: Brandi Fowler MRN: [id number] DOB: [birth year] Today's Date: 05/07/2022  History of Present Illness  Pt is a 57 y.o. female seen in ED for SOB, chest tightness, and leg pain in left leg.  PMH includes: BRBPR, chronic diarrhea, CKD, chronic pain, COPD, DDD, depression, obesity, schizophrenia, spine surgery, and seizures.   Clinical Impression  Pt calm and able to provide history, answer questions, and follow commands well initially but during the session presented with bouts of severe agitation as well as hearing and speaking to voices that were not present.  Daughter present during the second half of the session and inquired about the pt not receiving her psychiatric medications, MD notified.  In general pt presented with good functional strength and was able to perform bed mobility tasks and transfers with very good speed and control.  Pt able to amb near the EOB both with and without an AD with no LOB or buckling and with SpO2 >/= 88% on 3LO2/min.  Pt has an extensive reported fall history of almost one fall per day secondary to her legs buckling.  Recommending HHPT and a RW to address and attempt to reduce her fall frequency.        Recommendations for follow up therapy are one component of a multi-disciplinary discharge planning process, led by the attending physician.  Recommendations may be updated based on patient status, additional functional criteria and insurance authorization.  Follow Up Recommendations Home health PT      Assistance Recommended at Discharge Frequent or constant Supervision/Assistance  Patient can return home with the following  A little help with walking and/or transfers;A little help with bathing/dressing/bathroom;Assistance with cooking/housework;Direct supervision/assist for medications management;Help with stairs or ramp for entrance;Assist for transportation    Equipment Recommendations Rolling walker (2  wheels)  Recommendations for Other Services       Functional Status Assessment Patient has had a recent decline in their functional status and demonstrates the ability to make significant improvements in function in a reasonable and predictable amount of time.     Precautions / Restrictions Precautions Precautions: Fall Restrictions Weight Bearing Restrictions: No Other Position/Activity Restrictions: Impulsive, quick to become agitated      Mobility  Bed Mobility Overal bed mobility: Independent             General bed mobility comments: Good speed and control with bed mobility tasks    Transfers Overall transfer level: Needs assistance Equipment used: None Transfers: Sit to/from Stand Sit to Stand: Supervision           General transfer comment: Pt able to stand with very good speed and control without UE support    Ambulation/Gait Ambulation/Gait assistance: Supervision Gait Distance (Feet): 10 Feet Assistive device: None, Rolling walker (2 wheels) Gait Pattern/deviations: Step-through pattern, Decreased step length - right, Decreased step length - left Gait velocity: decreased     General Gait Details: Pt able to easily take steps at the EOB both with a RW as well as without an AD with no buckling or LOB, distance limited by fatigue and chronic foot pain with WB  Stairs            Wheelchair Mobility    Modified Rankin (Stroke Patients Only)       Balance Overall balance assessment: History of Falls, Needs assistance   Sitting balance-Leahy Scale: Normal     Standing balance support: No upper extremity supported Standing balance-Leahy Scale: Good  Pertinent Vitals/Pain Pain Assessment Pain Assessment: 0-10 Pain Score: 9  Pain Location: Back pain, chronic per patient Pain Descriptors / Indicators: Aching, Sore Pain Intervention(s): Repositioned, Premedicated before session, Monitored during  session    [place] expects to be discharged to:: Private residence Living Arrangements: Spouse/significant other Available Help at Discharge: Personal care attendant;Family;Available 24 hours/day Type of Home: House Home Access: Stairs to enter Entrance Stairs-Rails: Psychiatric nurse of Steps: 2   Home Layout: One level Home Equipment: Other (comment) Additional Comments: Owns a hemi walker; has a PCA 2.5 hrs/day 7 days/wk to assist as needed    Prior Function Prior Level of Function : Independent/Modified Independent             Mobility Comments: Ind amb household distances without an AD, falls almost every day secondary to leg buckling, has had HHPT and OPPT with neither helping in the past, states has not used a walker to assist secondary to clutter in the home ADLs Comments: PRN assist from PCA     Hand Dominance        Extremity/Trunk Assessment   Upper Extremity Assessment Upper Extremity Assessment: Overall WFL for tasks assessed    Lower Extremity Assessment Lower Extremity Assessment: Overall WFL for tasks assessed       Communication   Communication: Expressive difficulties  Cognition Arousal/Alertness: Awake/alert Behavior During Therapy: Agitated, Impulsive Overall Cognitive Status: Within Functional Limits for tasks assessed                                 General Comments: Pt reports chronic history of hearing voices and responded to voices she heard during the session, MD notified        General Comments      Exercises     Assessment/Plan    PT Assessment    PT Problem List         PT Treatment Interventions      PT Goals (Current goals can be found in the Care Plan section)  Acute Rehab PT Goals Patient Stated Goal: To return home PT Goal Formulation: With patient Time For Goal Achievement: 05/20/22 Potential to Achieve Goals: Good    Frequency       Co-evaluation                AM-PAC PT "6 Clicks" Mobility  Outcome Measure Help needed turning from your back to your side while in a flat bed without using bedrails?: None Help needed moving from lying on your back to sitting on the side of a flat bed without using bedrails?: None Help needed moving to and from a bed to a chair (including a wheelchair)?: A Little Help needed standing up from a chair using your arms (e.g., wheelchair or bedside chair)?: A Little Help needed to walk in hospital room?: A Little Help needed climbing 3-5 steps with a railing? : A Little 6 Click Score: 20    End of Session Equipment Utilized During Treatment: Oxygen Activity Tolerance: Patient limited by fatigue;Patient limited by pain Patient left: in bed;with call bell/phone within reach;with bed alarm set;with family/visitor present Nurse Communication: Mobility status;Other (comment) (Pt hearing and responding to voices)      Time: [id number] PT Time Calculation (min) (ACUTE ONLY): 31 min   Charges:   PT Evaluation $PT Eval Moderate Complexity: 1 Mod        D. Scott Beau Vanduzer PT, DPT 05/07/22,  5:24 PM

## 2022-05-07 NOTE — Progress Notes (Signed)
Initial Nutrition Assessment  DOCUMENTATION CODES:   Morbid obesity  INTERVENTION:   -Liberalize diet to 2 gram sodium for wider variety of meal selections -MVI with minerals daily -Ensure Enlive po BID, each supplement provides 350 kcal and 20 grams of protein.   NUTRITION DIAGNOSIS:   Increased nutrient needs related to chronic illness (COPD) as evidenced by estimated needs.  GOAL:   Patient will meet greater than or equal to 90% of their needs  MONITOR:   PO intake, Supplement acceptance  REASON FOR ASSESSMENT:   Consult Assessment of nutrition requirement/status  ASSESSMENT:   Pt admitted with SOB/ chest tightness and leg pain in left leg.  Pt admitted with COPD exacerbation.   Reviewed I/O's: +350 ml x 24 hours   Pt unavailable at time of visit. RD unable to obtain further nutrition-related history or complete nutrition-focused physical exam at this time.    Pt on a heart healthy/ carb modified diet. Pt with no history of DM. RD will liberalize diet for wider variety of meal selections to optimize adequate oral intake. Pt also with SOB, so suspect pt with decreased oral intake.   Reviewed wt hx; noted distant history of weight loss. Pt also with edema, which may be masking true weight loss as well as fat and muscle depletions.   Pt witj increased nutritional needs and would benefit from addition of oral nutrition supplements.   Obesity is a complex, chronic medical condition that is optimally managed by a multidisciplinary care team. Weight loss is not an ideal goal for an acute inpatient hospitalization. However, if further work-up for obesity is warranted, consider outpatient referral to [facility].    Medications reviewed and include colace, lasix, solu-medrol, and rocephin.   Labs reviewed.   Diet Order:   Diet Order             Diet heart healthy/carb modified Room service appropriate? Yes; Fluid  consistency: Thin  Diet effective now                   EDUCATION NEEDS:   No education needs have been identified at this time  Skin:  Skin Assessment: Reviewed RN Assessment  Last BM:  Unknown  Height:   Ht Readings from Last 1 Encounters:  02/18/19 '5\' 5"'$  (1.651 m)    Weight:   Wt Readings from Last 1 Encounters:  05/06/22 114.3 kg    Ideal Body Weight:  56.8 kg  BMI:  Body mass index is 41.93 kg/m.  Estimated Nutritional Needs:   Kcal:  1700-1900  Protein:  100-115 grams  Fluid:  > 1.7 L    Loistine Chance, RD, LDN, [place] Registered Dietitian II Certified Diabetes Care and Education Specialist Please refer to [facility] for RD and/or RD on-call/weekend/after hours pager

## 2022-05-08 DIAGNOSIS — J441 Chronic obstructive pulmonary disease with (acute) exacerbation: Principal | ICD-10-CM

## 2022-05-08 DIAGNOSIS — M7989 Other specified soft tissue disorders: Secondary | ICD-10-CM | POA: Insufficient documentation

## 2022-05-08 LAB — CBC
HCT: 40.1 % (ref 36.0–46.0)
Hemoglobin: 12.9 g/dL (ref 12.0–15.0)
MCH: 30.9 pg (ref 26.0–34.0)
MCHC: 32.2 g/dL (ref 30.0–36.0)
MCV: 95.9 fL (ref 80.0–100.0)
Platelets: 305 10*3/uL (ref 150–400)
RBC: 4.18 MIL/uL (ref 3.87–5.11)
RDW: 13.7 % (ref 11.5–15.5)
WBC: 18.2 10*3/uL — ABNORMAL HIGH (ref 4.0–10.5)
nRBC: 0 % (ref 0.0–0.2)

## 2022-05-08 LAB — BASIC METABOLIC PANEL
Anion gap: 9 (ref 5–15)
BUN: 26 mg/dL — ABNORMAL HIGH (ref 6–20)
CO2: 26 mmol/L (ref 22–32)
Calcium: 9.1 mg/dL (ref 8.9–10.3)
Chloride: 106 mmol/L (ref 98–111)
Creatinine, Ser: 0.85 mg/dL (ref 0.44–1.00)
GFR, Estimated: >60 mL/min (ref >60)
Glucose, Bld: 136 mg/dL — ABNORMAL HIGH (ref 70–99)
Potassium: 3.7 mmol/L (ref 3.5–5.1)
Sodium: 141 mmol/L (ref 135–145)

## 2022-05-08 LAB — HEMOGLOBIN A1C
Hgb A1c MFr Bld: 6.8 % — ABNORMAL HIGH (ref 4.8–5.6)
Mean Plasma Glucose: 148 mg/dL

## 2022-05-08 MED ORDER — FLUTICASONE PROPIONATE 50 MCG/ACT NA SUSP: 1.0000 | Freq: Every day | NASAL | 2 refills | Status: DC

## 2022-05-08 MED ORDER — HALOPERIDOL 1 MG PO TABS: 1.0000 mg | ORAL_TABLET | Freq: Every day | ORAL | 0 refills | Status: DC

## 2022-05-08 MED ORDER — FAMOTIDINE 40 MG PO TABS: 40.0000 mg | ORAL_TABLET | Freq: Two times a day (BID) | ORAL | 0 refills | Status: DC

## 2022-05-08 MED ORDER — PREDNISONE 10 MG PO TABS: ORAL_TABLET | ORAL | 0 refills | Status: DC

## 2022-05-08 MED ORDER — AZITHROMYCIN 250 MG PO TABS: 250.0000 mg | ORAL_TABLET | Freq: Every day | ORAL | 0 refills | Status: DC

## 2022-05-08 NOTE — TOC Initial Note (Signed)
Transition of Care [facility]) - Initial/Assessment Note    Patient Details  Name: Brandi Fowler MRN: [id number] Date of Birth: [birth year]  [facility]) CM/SW Contact:    Rebekah Chesterfield, LCSW Phone Number: 05/08/2022, 12:23 PM  Clinical Narrative:                 CSW met with pt and her significant other at bedside. CSW introduced role and explained that discharge planning would be discussed. Informed patient that PT has recommended home health PT services, in addition, to a RW. Patient endorsed feelings of frustration that she was being discharged stating that her medical conditions have not been resolved. CSW provided encouragement.  Patient is not interested in recommended DME or having CSW coordinate home health services, at this time. She was agreeable to CSW providing CMS scores for agencies that serve her zip code. Pt will follow up with PCP about ongoing management of health conditions.  Significant other agreed to provide transportation home today and to ongoing medical appts. He and pt's family will continue to provide support to patient as needed    Expected Discharge Plan: [place] Barriers to Discharge: Barriers Resolved   Patient Goals and CMS Choice            Expected Discharge Plan and Services       Living arrangements for the past 2 months: Single Family Home Expected Discharge Date: 05/08/22                                    Prior Living Arrangements/Services Living arrangements for the past 2 months: Single Family Home Lives with:: Significant Other Patient language and need for interpreter reviewed:: Yes Do you feel safe going back to the place where you live?: Yes      Need for Family Participation in Patient Care: Yes (Comment) Care giver support system in place?: Yes (comment)   Criminal Activity/Legal Involvement Pertinent to Current Situation/Hospitalization: No - Comment as needed  Activities of Daily  Living Home Assistive Devices/Equipment: Walker (specify type), Oxygen, Eyeglasses ADL Screening (condition at time of admission) Patient's cognitive ability adequate to safely complete daily activities?: Yes Is the patient deaf or have difficulty hearing?: No Does the patient have difficulty seeing, even when wearing glasses/contacts?: No Does the patient have difficulty concentrating, remembering, or making decisions?: No Patient able to express need for assistance with ADLs?: Yes Does the patient have difficulty dressing or bathing?: No Independently performs ADLs?: Yes (appropriate for developmental age) Does the patient have difficulty walking or climbing stairs?: Yes Weakness of Legs: Both Weakness of Arms/Hands: None  Permission Sought/Granted                  Emotional Assessment Appearance:: Appears stated age Attitude/Demeanor/Rapport: Angry, Crying Affect (typically observed): Agitated, Frustrated, Tearful/Crying Orientation: : Oriented to Self, Oriented to Place, Oriented to  Time, Oriented to Situation Alcohol / Substance Use: Not Applicable Psych Involvement: No (comment)  Admission diagnosis:  SOB (shortness of breath) [R06.02] COPD exacerbation ([place]) [J44.1] Left leg swelling [M79.89] Acute on chronic respiratory failure with hypoxia ([place]) [J96.21] Patient Active Problem List   Diagnosis Date Noted   Left leg swelling 05/08/2022   SOB (shortness of breath) 05/07/2022   Respiratory failure with hypoxia ([place]) 05/06/2022   Obstructive sleep apnea syndrome 01/10/2022   CAP (community acquired pneumonia) 08/30/2018   Seizure disorder (  [place]) 08/30/2018   Schizophrenia ([place]) 08/30/2018   GERD (gastroesophageal reflux disease) 08/30/2018   Sepsis ([place]) 08/30/2018   Restless legs syndrome 06/15/2018   Chronic venous insufficiency 06/15/2018   Lymphedema 06/15/2018   Leg pain 02/09/2018   COPD with acute exacerbation ([place]) 02/09/2018   DDD (degenerative disc  disease), thoracic 07/30/2014   DDD (degenerative disc disease), lumbar 07/30/2014   Thoracic facet syndrome 07/30/2014   Facet syndrome, lumbar 07/30/2014   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 [place] [id number] [place] [address] [place] [address] Phone: [phone] Fax: [phone]     Social Determinants of Health (SDOH) Social History: SDOH Screenings   Food Insecurity: No Food Insecurity (05/07/2022)  Housing: Low Risk  (05/07/2022)  Transportation Needs: No Transportation Needs (05/07/2022)  Utilities: Not At Risk (05/07/2022)  Financial Resource Strain: Low Risk  (08/31/2018)  Physical Activity: Insufficiently Active (08/31/2018)  Social Connections: Moderately Isolated (08/31/2018)  Stress: Stress Concern Present (08/31/2018)  Tobacco Use: High Risk (05/07/2022)   SDOH Interventions:     Readmission Risk Interventions     No data to display

## 2022-05-08 NOTE — Progress Notes (Signed)
Patient discharged to home, AVS reviewed with her and her significant other. All questions answered. IV removed. NT assisted patient to the exit.

## 2022-05-08 NOTE — Evaluation (Signed)
Occupational Therapy Evaluation Patient Details Name: Brandi Fowler MRN: [id number] DOB: [birth year] Today's Date: 05/08/2022   History of Present Illness Pt is a 57 y.o. female seen in ED for SOB, chest tightness, and leg pain in left leg.  PMH includes: BRBPR, chronic diarrhea, CKD, chronic pain, COPD, DDD, depression, obesity, schizophrenia, spine surgery, and seizures.   Clinical Impression   Patient presenting with decreased independence in self care, balance, functional mobility/transfers, endurance, and safety awareness. PTA pt lived with spouse, was Mod I for ADLs, received assistance as needed from PCA for IADLs (2.5 hrs/day, 7 days/wk), and was independent for functional mobility without an AD. Pt currently functioning at IND for bed mobility, set up-supervision for seated self-care tasks, and supervision for lateral scoot transfer toward [place]. Pt will benefit from acute OT to increase overall independence in the areas of ADLs and functional mobility in order to safely discharge to next venue of care. OT recommends ongoing therapy upon discharge to maximize safety and independence with ADLs, functional mobility, decrease fall risk, and decrease caregiver burden.    Recommendations for follow up therapy are one component of a multi-disciplinary discharge planning process, led by the attending physician.  Recommendations may be updated based on patient status, additional functional criteria and insurance authorization.   Follow Up Recommendations  Home health OT     Assistance Recommended at Discharge Frequent or constant Supervision/Assistance  Patient can return home with the following A little help with walking and/or transfers;A little help with bathing/dressing/bathroom;Assistance with cooking/housework;Assist for transportation;Help with stairs or ramp for entrance;Direct supervision/assist for financial management;Direct supervision/assist for medications management    Functional  Status Assessment  Patient has had a recent decline in their functional status and demonstrates the ability to make significant improvements in function in a reasonable and predictable amount of time.  Equipment Recommendations  None recommended by OT    Recommendations for Other Services       Precautions / Restrictions Precautions Precautions: Fall Restrictions Weight Bearing Restrictions: No Other Position/Activity Restrictions: Impulsive, quick to become agitated      Mobility Bed Mobility Overal bed mobility: Independent                  Transfers Overall transfer level: Needs assistance Equipment used: None Transfers: Bed to chair/wheelchair/BSC            Lateral/Scoot Transfers: Supervision General transfer comment: pt completed ~4 lateral scoots toward L side to [facility] with supervision, pt deferred standing this date 2/2 LLE pain and edema      Balance Overall balance assessment: History of Falls, Needs assistance   Sitting balance-Leahy Scale: Normal                                     ADL either performed or assessed with clinical judgement   ADL Overall ADL's : Needs assistance/impaired     Grooming: Set up;Sitting;Wash/dry face               Lower Body Dressing: Set up;Bed level Lower Body Dressing Details (indicate cue type and reason): socks Toilet Transfer: Supervision/safety;Cueing for safety Toilet Transfer Details (indicate cue type and reason): simulated via lateral scoot transfer toward [facility]                 Vision Baseline Vision/History: 1 Wears glasses Patient Visual Report: No change from baseline  Perception     Praxis      Pertinent Vitals/Pain Pain Assessment Pain Assessment: 0-10 Pain Score: 9  Pain Location: Back pain (chronic per pt), LLE (burning) Pain Descriptors / Indicators: Aching, Sore, Burning Pain Intervention(s): Limited activity within patient's tolerance, Monitored during  session, Repositioned     Hand Dominance     Extremity/Trunk Assessment Upper Extremity Assessment Upper Extremity Assessment: RUE deficits/detail;LUE deficits/detail (pt reports she has pain in BUEs when completing shoulder flexion to 90 deg (has chronic back issues per pt)) RUE: Unable to fully assess due to pain LUE: Unable to fully assess due to pain   Lower Extremity Assessment Lower Extremity Assessment: LLE deficits/detail LLE Deficits / Details: pt endorsed LLE is currently swollen and described pain as burning sensation       Communication Communication Communication: Expressive difficulties   Cognition Arousal/Alertness: Awake/alert Behavior During Therapy: WFL for tasks assessed/performed Overall Cognitive Status: History of cognitive impairments - at baseline                                 General Comments: Pt tangential with conversation requiring VC for redirection t/o session     General Comments       Exercises Other Exercises Other Exercises: OT provided education re: role of OT, OT POC, post acute recs, sitting up for all meals, EOB/OOB mobility with assistance, home/fall safety.     Shoulder Instructions      Home Living Family/patient expects to be discharged to:: Private residence Living Arrangements: Spouse/significant other Available Help at Discharge: Personal care attendant;Family;Available 24 hours/day Type of Home: House Home Access: Stairs to enter [employer] of Steps: 2 Entrance Stairs-Rails: Right;Left Home Layout: One level     Bathroom Shower/Tub: Teacher, early years/pre: Standard     Home Equipment: Other (comment);Wheelchair - manual;Grab bars - tub/shower (hemi walker)   Additional Comments: on 2-3L O2 via [place] at baseline      Prior Functioning/Environment Prior Level of Function : Needs assist;History of Falls (last six months)             Mobility Comments: Ind amb household  distances without an AD, falls almost every day secondary to leg buckling, has had HHPT and OPPT with neither helping in the past, states has not used a walker to assist secondary to clutter in the home ADLs Comments: Mod I ADLs, has a PCA 2.5 hrs/day 7 days/wk to assist as needed with "household chores", medical transport to appointments        OT Problem List: Decreased strength;Decreased range of motion;Decreased activity tolerance;Impaired balance (sitting and/or standing);Increased edema;Pain;Cardiopulmonary status limiting activity;Decreased cognition;Decreased safety awareness      OT Treatment/Interventions: Self-care/ADL training;Therapeutic exercise;Neuromuscular education;Energy conservation;DME and/or AE instruction;Manual therapy;Modalities;Balance training;Patient/family education;Visual/perceptual remediation/compensation;Cognitive remediation/compensation;Splinting    OT Goals(Current goals can be found in the care plan section) Acute Rehab OT Goals Patient Stated Goal: return home OT Goal Formulation: With patient Time For Goal Achievement: 05/22/22 Potential to Achieve Goals: Good   OT Frequency: Min 2X/week    Co-evaluation              AM-PAC OT "6 Clicks" Daily Activity     Outcome Measure Help from another person eating meals?: None Help from another person taking care of personal grooming?: A Little Help from another person toileting, which includes using toliet, bedpan, or urinal?: A Little Help from another person bathing (including  washing, rinsing, drying)?: A Little Help from another person to put on and taking off regular upper body clothing?: None Help from another person to put on and taking off regular lower body clothing?: A Little 6 Click Score: 20   End of Session Nurse Communication: Mobility status  Activity Tolerance: Patient tolerated treatment well Patient left: in bed;with call bell/phone within reach;with bed alarm set;with  family/visitor present  OT Visit Diagnosis: Other abnormalities of gait and mobility (R26.89);Muscle weakness (generalized) (M62.81);History of falling (Z91.81);Repeated falls (R29.6);Pain Pain - Right/Left: Left Pain - part of body: Leg (back)                Time: [id number] OT Time Calculation (min): 15 min Charges:  OT General Charges $OT Visit: 1 Visit OT Evaluation $OT Eval Low Complexity: 1 Low  [facility] MS, OTR/L ascom [phone]  05/08/22, 12:52 PM

## 2022-05-08 NOTE — Discharge Summary (Signed)
Brandi Fowler [id number] DOB: [birth year] DOA: 05/06/2022  PCP: System, Provider Not In  Admit date: 05/06/2022 Discharge date: 05/08/2022  Time spent: 35 minutes  Recommendations for Outpatient Follow-up:  Pulmonology f/u, attention to pulmonary nodule that will need repeat imaging in 3 months Neurology f/u     Discharge Diagnoses:  Principal Problem:   COPD with acute exacerbation ([place]) Active Problems:   CAP (community acquired pneumonia)   Respiratory failure with hypoxia ([place])   Restless legs syndrome   Seizure disorder (H[facility])   Schizophrenia ([place])   GERD (gastroesophageal reflux disease)   SOB (shortness of breath)   Obesity   Obstructive sleep apnea syndrome   Left leg swelling   Discharge Condition: improved   Diet recommendation: heart healthy  Filed Weights   05/06/22 1535  Weight: 114.3 kg    History of present illness:  From admission h and p by dr. Posey Pronto: Brandi Fowler is an 57 y.o. female seen in ed for SOB/ chest tightness and leg pain in left leg.  HPI is limited due to pt's speech . She is also reporting sob even on home o2 and worse with ambulation. She continue to smoke and is cutting down.    Hospital Course:   # COPD with acute exacerbation Dyspnea, sputum production. Does not appear to have pneumonia. CTA neg for large PE. Bnp normal and normal tte 11/23. Improving, stable for discharge, ambulated satisfactorily with PT. Covid/flu/rsv neg. PT advises [facility] but patient declines. - treated with ceftriaxone, discharge to complete course of azithromycin - steroid burst then taper - cont home inhalers - outpt pulm f/u   # Acute on chronic respiratory failure O2 appropriate on home o2 but more dyspneic than usual on presentation, now much improved - continue o2   # Left leg pain, erythema # Lower extremity edema Reports 5 years left  lower extremity pain and swelling, also pain in right leg. Extensive outpatient w/u with several venous  dopplers neg for DVT (and negative again on presentation here). [facility] where they noted venous duplex to assess for obstruction proximal to the inguinal ligament was also normal. Discussed with neuro, advises outpt neurology f/u (which patient has scheduled though not for several months), may need emg and/or nerve biopsy - ambulatory referral to neurology   # Lung-rads 4a nodule - needs 3 mo f/u imaging outpt   # Schizophrenia Here intermittently agitated but ultimately redirectable     Procedures: none   Consultations: none  Discharge Exam: Vitals:   05/08/22 0418 05/08/22 0740  BP: 129/76 (!) 144/79  Pulse: 97 (!) 106  Resp: 20 18  Temp: 97.8 F (36.6 C) 97.9 F (36.6 C)  SpO2: 94% 100%    General exam: Appears dissheveled Respiratory system: scattered rhonchi, few wheeze Cardiovascular system: S1 & S2 heard, RRR. No JVD, murmurs, rubs, gallops or clicks. Gastrointestinal system: Abdomen is obese, soft and nontender. No organomegaly or masses felt. Normal bowel sounds heard. Central nervous system: Alert and oriented. No focal neurological deficits. Extremities: Symmetric 5 x 5 power. Skin: hyperemia left leg. Tender to touch both legs. warm Psychiatry: bit scattered in her thinking  Discharge Instructions   Discharge Instructions     Ambulatory referral to Neurology   Complete by: As directed    Diet - low sodium heart healthy   Complete by: As directed    Increase activity slowly   Complete by: As directed  Allergies as of 05/08/2022       Reactions   Aripiprazole Swelling   Other Reaction(s): Other (See Comments) Other reaction(s): Edema   Baclofen Swelling   Buprenorphine-naloxone Other (See Comments)   Other reaction(s): Other (See Comments), Other (See Comments), seizure  SEIZURE   Clonazepam Rash   Cyclobenzaprine Nausea And Vomiting, Swelling   Other reaction(s): swelling, vomiting    Doxepin Rash   Fentanyl Nausea And Vomiting   Other reaction(s): Other (See Comments)   Lurasidone Swelling, Nausea Only   Other reaction(s): unknown   Methadone Swelling   Other reaction(s): Angioedema, n/v   Oxymorphone Other (See Comments)   Other reaction(s): Other (See Comments), Other (See Comments), Other (See Comments), seizure  SEIZURE  seizures   Penicillins Swelling   Other reaction(s): Throat swells  Other reaction(s): Edema, Other (See Comments), UNKNOWN   Pregabalin Nausea Only, Swelling   Quetiapine Other (See Comments), Diarrhea, Nausea Only   Other reaction(s): Other (See Comments), swelling   Tolmetin Rash   Trazodone Itching, Hives   Acetaminophen Nausea And Vomiting, Diarrhea, Nausea Only   Other reaction(s): diarrhea, nausea, vomiting, Other (See Comments)   Bupropion Other (See Comments)   Other reaction(s): Abdominal Pain   Colestipol    Other reaction(s): blood in urine, tingling in hands, feet, legs, Other (See Comments)  Urine in blood tingling in hands feet legs   Gabapentin Nausea Only   Hydrocodone Itching, Nausea Only   Other reaction(s): rash, swelling, vomiting   Paroxetine Nausea Only   Other reaction(s): unknown   Sertraline Diarrhea, Nausea And Vomiting   Amitriptyline Hcl    Bromfenac    Ciprofloxacin Hcl Other (See Comments)   Clindamycin/lincomycin    Codeine    Etodolac    Flurbiprofen    Haloperidol    Other reaction(s): Headache   Ibuprofen Other (See Comments)   Bleeding in stomach   Indocin [indomethacin]    Ketoprofen    Latuda [lurasidone Hcl]    Meloxicam    Nabumetone    Naproxen    Opana [oxymorphone Hcl] Other (See Comments)   seizures   Oxaprozin    Oxycodone    Seroquel [quetiapine Fumarate]    Suboxone [buprenorphine Hcl-naloxone Hcl] Other (See Comments)   seizures   Tramadol    Valproic Acid Diarrhea   Zoloft [sertraline Hcl] Nausea And Vomiting   Buprenorphine Rash   Ciprofloxacin Itching, Other (See  Comments)   Other reaction(s): Unknown   Clindamycin Rash   Metronidazole    Other reaction(s): blood in urine, tingling in hands, legs, feet, Other (See Comments)  Blood in urine tingling in hands feet legs   Mirtazapine Itching   Other reaction(s): unknown        Medication List     TAKE these medications    albuterol 1.25 MG/3ML nebulizer solution Commonly known as: ACCUNEB Take 1 ampule by nebulization every 6 (six) hours as needed for wheezing.   albuterol 108 (90 Base) MCG/ACT inhaler Commonly known as: VENTOLIN HFA Inhale 2 puffs into the lungs every 4 (four) hours as needed.   azelastine 0.1 % nasal spray Commonly known as: ASTELIN Place 1 spray into both nostrils 2 (two) times daily. Use in each nostril as directed   azithromycin 250 MG tablet Commonly known as: ZITHROMAX Take 1 tablet (250 mg total) by mouth daily. Take as directed: Two pills by mouth the first day, then one pill every day until completed   docusate sodium 100 MG capsule Commonly  known as: COLACE Take 100 mg by mouth 2 (two) times daily as needed for mild constipation.   famotidine 40 MG tablet Commonly known as: PEPCID Take 1 tablet (40 mg total) by mouth 2 (two) times daily for 10 days.   fluticasone 50 MCG/ACT nasal spray Commonly known as: FLONASE Place 1 spray into both nostrils daily. What changed:  how to take this when to take this   furosemide 40 MG tablet Commonly known as: LASIX Take 40 mg by mouth in the morning.   haloperidol 1 MG tablet Commonly known as: HALDOL Take 1 tablet (1 mg total) by mouth at bedtime.   hydrOXYzine 25 MG tablet Commonly known as: ATARAX Take 25 mg by mouth 2 (two) times daily as needed for anxiety.   ipratropium-albuterol 0.5-2.5 (3) MG/3ML Soln Commonly known as: DUONEB Take 3 mLs by nebulization.   multivitamin with minerals Tabs tablet Take 1 tablet by mouth daily.   Narcan 4 MG/0.1ML Liqd nasal spray kit Generic drug:  naloxone CALL 911. INSTILL 1 SPRAY IN ONE NOSTRIL. MAY REPEAT Q 2 TO 3 MINUTES IF SYMPTOMS OF AN OPIOID EMERGENCY PERSISTS. ALTERNATE NOSTRILS.   ondansetron 4 MG disintegrating tablet Commonly known as: ZOFRAN-ODT Take 4 mg by mouth every 8 (eight) hours as needed for nausea.   Oxycodone HCl 10 MG Tabs Take 10 mg by mouth every 6 (six) hours as needed.   predniSONE 10 MG tablet Commonly known as: DELTASONE 4 tabs daily for 4 days, then 3 tabs daily for 3 days, then 2 tabs daily for 3 days, then 1 tab daily for 3 days What changed:  medication strength how much to take how to take this when to take this additional instructions   rOPINIRole 0.5 MG tablet Commonly known as: Requip Take 1 tablet (0.5 mg total) by mouth at bedtime. What changed: how much to take   tiZANidine 4 MG tablet Commonly known as: ZANAFLEX Take 4-8 mg by mouth every 6 (six) hours as needed for muscle spasms. Stop methocarbamol. Take 8 mg at bedtime.   Trelegy Ellipta 100-62.5-25 MCG/ACT Aepb Generic drug: Fluticasone-Umeclidin-Vilant Inhale 1 puff into the lungs daily.               Durable Medical Equipment  (From admission, onward)           Start     Ordered   05/07/22 2208  For home use only DME Walker rolling  Once       Question Answer Comment  Walker: With 5 Inch Wheels   Patient needs a walker to treat with the following condition Generalized weakness      05/07/22 2207           Allergies  Allergen Reactions   Aripiprazole Swelling    Other Reaction(s): Other (See Comments)  Other reaction(s): Edema   Baclofen Swelling   Buprenorphine-Naloxone Other (See Comments)    Other reaction(s): Other (See Comments), Other (See Comments), seizure  SEIZURE   Clonazepam Rash   Cyclobenzaprine Nausea And Vomiting and Swelling    Other reaction(s): swelling, vomiting   Doxepin Rash   Fentanyl Nausea And Vomiting    Other reaction(s): Other (See Comments)   Lurasidone  Swelling and Nausea Only    Other reaction(s): unknown   Methadone Swelling    Other reaction(s): Angioedema, n/v   Oxymorphone Other (See Comments)    Other reaction(s): Other (See Comments), Other (See Comments), Other (See Comments), seizure  SEIZURE  seizures  Penicillins Swelling    Other reaction(s): Throat swells  Other reaction(s): Edema, Other (See Comments), UNKNOWN   Pregabalin Nausea Only and Swelling   Quetiapine Other (See Comments), Diarrhea and Nausea Only    Other reaction(s): Other (See Comments), swelling   Tolmetin Rash   Trazodone Itching and Hives   Acetaminophen Nausea And Vomiting, Diarrhea and Nausea Only    Other reaction(s): diarrhea, nausea, vomiting, Other (See Comments)   Bupropion Other (See Comments)    Other reaction(s): Abdominal Pain   Colestipol     Other reaction(s): blood in urine, tingling in hands, feet, legs, Other (See Comments)  Urine in blood tingling in hands feet legs   Gabapentin Nausea Only   Hydrocodone Itching and Nausea Only    Other reaction(s): rash, swelling, vomiting   Paroxetine Nausea Only    Other reaction(s): unknown   Sertraline Diarrhea and Nausea And Vomiting   Amitriptyline Hcl    Bromfenac    Ciprofloxacin Hcl Other (See Comments)   Clindamycin/Lincomycin    Codeine    Etodolac    Flurbiprofen    Haloperidol     Other reaction(s): Headache   Ibuprofen Other (See Comments)    Bleeding in stomach   Indocin [Indomethacin]    Ketoprofen    Latuda [Lurasidone Hcl]    Meloxicam    Nabumetone    Naproxen    Opana [Oxymorphone Hcl] Other (See Comments)    seizures   Oxaprozin    Oxycodone    Seroquel [Quetiapine Fumarate]    Suboxone [Buprenorphine Hcl-Naloxone Hcl] Other (See Comments)    seizures   Tramadol    Valproic Acid Diarrhea   Zoloft [Sertraline Hcl] Nausea And Vomiting   Buprenorphine Rash   Ciprofloxacin Itching and Other (See Comments)    Other reaction(s): Unknown   Clindamycin Rash    Metronidazole     Other reaction(s): blood in urine, tingling in hands, legs, feet, Other (See Comments)  Blood in urine tingling in hands feet legs   Mirtazapine Itching    Other reaction(s): unknown    Follow-up Information     Erby Pian, MD Follow up.   Specialty: Specialist Contact information: [place] [address] [phone]                  The results of significant diagnostics from this hospitalization (including imaging, microbiology, ancillary and laboratory) are listed below for reference.    Significant Diagnostic Studies: US Venous Img Lower Unilateral Left  Result Date: 05/06/2022 CLINICAL DATA:  Left leg swelling and pain EXAM: LEFT LOWER EXTREMITY VENOUS DOPPLER ULTRASOUND TECHNIQUE: Gray-scale sonography with compression, as well as color and duplex ultrasound, were performed to evaluate the deep venous system(s) from the level of the common femoral vein through the popliteal and proximal calf veins. COMPARISON:  None Available. FINDINGS: VENOUS Normal compressibility of the common femoral, superficial femoral, and popliteal veins, as well as the visualized calf veins. Visualized portions of profunda femoral vein and great saphenous vein unremarkable. No filling defects to suggest DVT on grayscale or color Doppler imaging. Doppler waveforms show normal direction of venous flow, normal respiratory plasticity and response to augmentation. Limited views of the contralateral common femoral vein are unremarkable. OTHER None. Limitations: none IMPRESSION: Negative. Electronically Signed   By: Fidela Salisbury M.D.   On: 05/06/2022 21:36   CT Angio Chest PE W and/or Wo Contrast  Result Date: 05/06/2022 CLINICAL DATA:  Chest  pain and shortness of breath. Left leg swelling and redness. EXAM: CT ANGIOGRAPHY CHEST WITH CONTRAST TECHNIQUE: Multidetector CT imaging of the chest was performed using the standard  protocol during bolus administration of intravenous contrast. Multiplanar CT image reconstructions and MIPs were obtained to evaluate the vascular anatomy. RADIATION DOSE REDUCTION: This exam was performed according to the departmental dose-optimization program which includes automated exposure control, adjustment of the mA and/or kV according to patient size and/or use of iterative reconstruction technique. CONTRAST:  32m OMNIPAQUE IOHEXOL 350 MG/ML SOLN COMPARISON:  CT chest with contrast 12/31/2011 chest x-ray 05/06/2022 earlier FINDINGS: Cardiovascular: Diffuse breathing motion throughout the examination. This significantly limits evaluation pulmonary emboli. Nondiagnostic for small and peripheral emboli. No large or central embolus. There is some enlargement of the central pulmonary arteries. Please correlate for pulmonary artery hypertension. There is a bovine type aortic arch. Grossly normal caliber thoracic aorta with mild vascular calcifications. Coronary artery calcifications are also noted. Please correlate for other coronary risk factors. The heart is nonenlarged. Trace pericardial fluid. Mediastinum/Nodes: Normal caliber thoracic esophagus. No specific abnormal lymph node enlargement present in the axillary regions. There are some enlarged mediastinal nodes. Example pretracheal today measuring 19 x 13 mm. In 2013 this same lymph node measured 13 by 8 mm. Additional enlarging nodes seen subcarinal, paratracheal. There also enlarged hilar nodes. On the right on series 4, image 59 measuring 3.0 1.5 cm. Left side inferiorly on series 4, image 71 measuring 15 by 17 mm. Lungs/Pleura: Diffuse breathing motion. Centrilobular emphysematous lung changes are identified. Dependent ground-glass. Mild interstitial thickening. There are areas of peribronchial thickening identified particularly in the lower lung zones and right upper lobe. There also some lung nodules identified today. Example subpleural lateral left  lower lobe, new from previous measuring 9 x 5 mm on series 5, image 108. Right upper lobe series 5, image 61 measuring 11 by 5 mm. Some ill-defined semi-solid nodules medial left upper lobe on image 55 series 5 measuring up to 6 mm. Upper Abdomen: Fatty liver infiltration. Nodular thickening of the left adrenal gland. Discrete nodule on the right is stable going back to 2013 consistent with an adenoma measuring 2.3 cm. Hounsfield unit of -20. calcifications seen at the edge of the imaging field involving the right renal parenchyma. Musculoskeletal: Fixation hardware seen along the lower cervical spine at the edge of the imaging field. Mild degenerative changes along the spine. Review of the MIP images confirms the above findings. IMPRESSION: Significant breathing motion limiting evaluation. Nondiagnostic for small peripheral emboli. No large or central embolus. Emphysematous lung changes. Enlarged pulmonary arteries. Please correlate for pulmonary artery hypertension. Scattered interstitial thickening with ground-glass in the lungs. Areas of bronchial wall thickening identified. There is also some small nodules which are new from 2013 measuring up to 11 mm. Lung-RADS 4A, suspicious. Follow up low-dose chest CT without contrast in 3 months (please use the following order, "CT CHEST LCS NODULE FOLLOW-UP W/O CM") is recommended. Alternatively, PET may be considered when there is a solid component 856mor larger. Several enlarged mediastinal and hilar nodes bilaterally. These could be reactive versus more aggressive process. Please correlate with any known history of more recent comparison study. Otherwise further workup is recommended when appropriate. Coronary artery calcifications. Please correlate for other coronary risk factors. Fatty liver infiltration. Adrenal adenomas. Electronically Signed   By: AsJill Side.D.   On: 05/06/2022 19:34   DG Chest 2 View  Result Date: 05/06/2022 CLINICAL DATA:  Chest pain and  shortness of breath EXAM: CHEST - 2 VIEW COMPARISON:  Chest x-ray 08/30/2018 FINDINGS: There are increased interstitial markings centrally in both lungs. There some patchy opacities in the left lung base. There is no pleural effusion or pneumothorax. The cardiomediastinal silhouette is within normal limits. Cervical spinal fusion plate is present. IMPRESSION: 1. Patchy opacities in the left lung base, concerning for pneumonia. 2. Increased interstitial markings centrally in both lungs may reflect mild pulmonary edema. Electronically Signed   By: Ronney Asters M.D.   On: 05/06/2022 16:15    Microbiology: Recent Results (from the past 240 hour(s))  Blood culture (routine x 2)     Status: None (Preliminary result)   Collection Time: 05/06/22  6:30 PM   Specimen: BLOOD  Result Value Ref Range Status   Specimen Description BLOOD LEFT FOREARM  Final   Special Requests   Final    BOTTLES DRAWN AEROBIC AND ANAEROBIC Blood Culture results may not be optimal due to an excessive volume of blood received in culture bottles   Culture   Final    NO GROWTH 2 DAYS Performed at [facility], [address], [place], [place] [address]    Report Status PENDING  Incomplete  Blood culture (routine x 2)     Status: None (Preliminary result)   Collection Time: 05/06/22  6:56 PM   Specimen: BLOOD  Result Value Ref Range Status   Specimen Description BLOOD LEFT ARM  Final   Special Requests   Final    BOTTLES DRAWN AEROBIC AND ANAEROBIC Blood Culture adequate volume   Culture   Final    NO GROWTH 2 DAYS Performed at [facility], [address], [place], [place] [address]    Report Status PENDING  Incomplete  Expectorated Sputum Assessment w Gram Stain, Rflx to Resp Cult     Status: None   Collection Time: 05/07/22  9:13 AM   Specimen: Expectorated Sputum  Result Value Ref Range Status   Specimen Description EXPECTORATED SPUTUM  Final   Special Requests NONE  Final   Sputum evaluation    Final    THIS SPECIMEN IS ACCEPTABLE FOR SPUTUM CULTURE Performed at [facility], [address] [place],  [address]    Report Status 05/07/2022 FINAL  Final  Resp panel by RT-PCR (RSV, Flu A&B, Covid) Expectorated Sputum     Status: None   Collection Time: 05/07/22  9:13 AM   Specimen: Expectorated Sputum; Nasal Swab  Result Value Ref Range Status   SARS Coronavirus 2 by RT PCR NEGATIVE NEGATIVE Final    Comment: (NOTE) SARS-CoV-2 target nucleic acids are NOT DETECTED.  The SARS-CoV-2 RNA is generally detectable in upper respiratory specimens during the acute phase of infection. The lowest concentration of SARS-CoV-2 viral copies this assay can detect is 138 copies/mL. A negative result does not preclude SARS-Cov-2 infection and should not be used as the sole basis for treatment or other patient management decisions. A negative result may occur with  improper specimen collection/handling, submission of specimen other than nasopharyngeal swab, presence of viral mutation(s) within the areas targeted by this assay, and inadequate number of viral copies(<138 copies/mL). A negative result must be combined with clinical observations, patient history, and epidemiological information. The expected result is Negative.  Fact Sheet for Patients:  EntrepreneurPulse.com.au  Fact Sheet for Healthcare Providers:  IncredibleEmployment.be  This test is no t yet approved or cleared by the [place] FDA and  has been authorized for detection and/or  diagnosis of SARS-CoV-2 by FDA under an Emergency Use Authorization (EUA). This EUA will remain  in effect (meaning this test can be used) for the duration of the COVID-19 declaration under Section 564(b)(1) of the Act, 21 U.S.C.section 360bbb-3(b)(1), unless the authorization is terminated  or revoked sooner.       Influenza A by PCR NEGATIVE NEGATIVE Final   Influenza B by PCR NEGATIVE  NEGATIVE Final    Comment: (NOTE) The Xpert Xpress SARS-CoV-2/FLU/RSV plus assay is intended as an aid in the diagnosis of influenza from Nasopharyngeal swab specimens and should not be used as a sole basis for treatment. Nasal washings and aspirates are unacceptable for Xpert Xpress SARS-CoV-2/FLU/RSV testing.  Fact Sheet for Patients: EntrepreneurPulse.com.au  Fact Sheet for Healthcare Providers: IncredibleEmployment.be  This test is not yet approved or cleared by the [place] FDA and has been authorized for detection and/or diagnosis of SARS-CoV-2 by FDA under an Emergency Use Authorization (EUA). This EUA will remain in effect (meaning this test can be used) for the duration of the COVID-19 declaration under Section 564(b)(1) of the Act, 21 U.S.C. section 360bbb-3(b)(1), unless the authorization is terminated or revoked.     Resp Syncytial Virus by PCR NEGATIVE NEGATIVE Final    Comment: (NOTE) Fact Sheet for Patients: EntrepreneurPulse.com.au  Fact Sheet for Healthcare Providers: IncredibleEmployment.be  This test is not yet approved or cleared by the [place] FDA and has been authorized for detection and/or diagnosis of SARS-CoV-2 by FDA under an Emergency Use Authorization (EUA). This EUA will remain in effect (meaning this test can be used) for the duration of the COVID-19 declaration under Section 564(b)(1) of the Act, 21 U.S.C. section 360bbb-3(b)(1), unless the authorization is terminated or revoked.  Performed at [facility], [address] [place], [place] [address]   Culture, Respiratory w Gram Stain     Status: None (Preliminary result)   Collection Time: 05/07/22  9:13 AM  Result Value Ref Range Status   Specimen Description   Final    EXPECTORATED SPUTUM Performed at [facility], [address] [place], [place] [address]    Special Requests   Final     NONE Reflexed from [id number] Performed at [facility], [place], [place] [address]    Gram Stain   Final    FEW GRAM POSITIVE RODS FEW WBC PRESENT, PREDOMINANTLY PMN Performed at [facility], [place] [address], [place], [place] [address]    Culture PENDING  Incomplete   Report Status PENDING  Incomplete     Labs: Basic Metabolic Panel: Recent Labs  Lab 05/06/22 1538 05/07/22 2221 05/08/22 0440  NA 138 140 141  K 4.0 3.8 3.7  CL 103 106 106  CO2 '28 23 26  '$ GLUCOSE 103* 277* 136*  BUN 18 26* 26*  CREATININE 0.68 1.08* 0.85  CALCIUM 8.9 9.1 9.1  MG  --  2.2  --    Liver Function Tests: No results for input(s): "AST", "ALT", "ALKPHOS", "BILITOT", "PROT", "ALBUMIN" in the last 168 hours. No results for input(s): "LIPASE", "AMYLASE" in the last 168 hours. No results for input(s): "AMMONIA" in the last 168 hours. CBC: Recent Labs  Lab 05/06/22 1538 05/08/22 0440  WBC 12.4* 18.2*  HGB 13.0 12.9  HCT 40.3 40.1  MCV 95.3 95.9  PLT 249 305   Cardiac Enzymes: No results for input(s): "CKTOTAL", "CKMB", "CKMBINDEX", "TROPONINI" in the last 168 hours. BNP: BNP (last 3 results) Recent Labs  05/06/22 1830  BNP 82.4    ProBNP (last 3 results) No results for input(s): "PROBNP" in the last 8760 hours.  CBG: No results for input(s): "GLUCAP" in the last 168 hours.     Signed:  Desma Maxim MD.  [facility] 05/08/2022, 10:51 AM

## 2022-05-09 LAB — CULTURE, RESPIRATORY W GRAM STAIN: Culture: NORMAL

## 2022-05-10 ENCOUNTER — Telehealth: Payer: Self-pay | Admitting: *Deleted

## 2022-05-10 NOTE — Transitions of Care (Post Inpatient/ED Visit) (Signed)
   05/10/2022  Name: Brandi Fowler MRN: [id number] DOB: [birth year]  Today's TOC FU Call Status: Today's TOC FU Call Status:: Unsuccessul Call (1st Attempt) Unsuccessful Call (1st Attempt) Date: 05/10/22  Attempted to reach the patient regarding the most recent Inpatient/ED visit.  Follow Up Plan: Additional outreach attempts will be made to reach the patient to complete the Transitions of Care (Post Inpatient/ED visit) call.   [place] [facility] [phone]

## 2022-05-11 ENCOUNTER — Telehealth: Payer: Self-pay | Admitting: *Deleted

## 2022-05-11 LAB — CULTURE, BLOOD (ROUTINE X 2)
Culture: NO GROWTH
Culture: NO GROWTH
Special Requests: ADEQUATE

## 2022-05-11 NOTE — Transitions of Care (Post Inpatient/ED Visit) (Signed)
   05/11/2022  Name: Brandi Fowler MRN: [id number] DOB: [birth year]  Today's TOC FU Call Status: Today's TOC FU Call Status:: Unsuccessful Call (3rd Attempt) Unsuccessful Call (3rd Attempt) Date: 05/11/22  Attempted to reach the patient regarding the most recent Inpatient/ED visit.  Follow Up Plan: No further outreach attempts will be made at this time. We have been unable to contact the patient.  [facility] [phone]

## 2022-05-11 NOTE — Transitions of Care (Post Inpatient/ED Visit) (Signed)
   05/11/2022  Name: Brandi Fowler MRN: [id number] DOB: 03/28/1965  Today's TOC FU Call Status: Today's TOC FU Call Status:: Unsuccessful Call (2nd Attempt) Unsuccessful Call (2nd Attempt) Date: 05/11/22  Attempted to reach the patient regarding the most recent Inpatient/ED visit.  Follow Up Plan: Additional outreach attempts will be made to reach the patient to complete the Transitions of Care (Post Inpatient/ED visit) call.   [facility] [phone]

## 2022-07-05 ENCOUNTER — Other Ambulatory Visit: Payer: Self-pay | Admitting: Specialist

## 2022-07-05 DIAGNOSIS — R918 Other nonspecific abnormal finding of lung field: Secondary | ICD-10-CM

## 2022-07-05 DIAGNOSIS — R590 Localized enlarged lymph nodes: Secondary | ICD-10-CM

## 2022-07-23 ENCOUNTER — Ambulatory Visit
Admission: RE | Admit: 2022-07-23 | Discharge: 2022-07-23 | Disposition: A | Discharge status: Home / Self Care | Payer: 59 | Source: Ambulatory Visit | Attending: Specialist | Admitting: Specialist

## 2022-07-23 DIAGNOSIS — R918 Other nonspecific abnormal finding of lung field: Secondary | ICD-10-CM | POA: Diagnosis present

## 2022-07-23 DIAGNOSIS — R590 Localized enlarged lymph nodes: Secondary | ICD-10-CM

## 2022-07-30 ENCOUNTER — Other Ambulatory Visit: Payer: Self-pay | Admitting: Pulmonary Disease

## 2022-07-30 DIAGNOSIS — R9389 Abnormal findings on diagnostic imaging of other specified body structures: Secondary | ICD-10-CM

## 2022-07-30 DIAGNOSIS — R911 Solitary pulmonary nodule: Secondary | ICD-10-CM

## 2022-08-11 ENCOUNTER — Ambulatory Visit: Admission: RE | Admit: 2022-08-11 | Payer: 59 | Source: Ambulatory Visit

## 2022-08-23 ENCOUNTER — Ambulatory Visit
Admission: RE | Admit: 2022-08-23 | Discharge: 2022-08-23 | Disposition: A | Discharge status: Home / Self Care | Payer: 59 | Source: Ambulatory Visit | Attending: Pulmonary Disease | Admitting: Pulmonary Disease

## 2022-08-23 VITALS — Wt 277.0 lb

## 2022-08-23 DIAGNOSIS — I700 Atherosclerosis of aorta: Secondary | ICD-10-CM | POA: Insufficient documentation

## 2022-08-23 DIAGNOSIS — I2510 Atherosclerotic heart disease of native coronary artery without angina pectoris: Secondary | ICD-10-CM | POA: Insufficient documentation

## 2022-08-23 DIAGNOSIS — R911 Solitary pulmonary nodule: Secondary | ICD-10-CM | POA: Insufficient documentation

## 2022-08-23 DIAGNOSIS — J439 Emphysema, unspecified: Secondary | ICD-10-CM | POA: Diagnosis not present

## 2022-08-23 DIAGNOSIS — K760 Fatty (change of) liver, not elsewhere classified: Secondary | ICD-10-CM | POA: Insufficient documentation

## 2022-08-23 DIAGNOSIS — D3501 Benign neoplasm of right adrenal gland: Secondary | ICD-10-CM | POA: Diagnosis not present

## 2022-08-23 DIAGNOSIS — R9389 Abnormal findings on diagnostic imaging of other specified body structures: Secondary | ICD-10-CM | POA: Diagnosis not present

## 2022-08-23 LAB — GLUCOSE, CAPILLARY: Glucose-Capillary: 120 mg/dL — ABNORMAL HIGH (ref 70–99)

## 2022-08-23 MED ORDER — FLUDEOXYGLUCOSE F - 18 (FDG) INJECTION
13.0800 | Freq: Once | INTRAVENOUS | Status: AC | PRN
  Administered 2022-08-23: 13.08 via INTRAVENOUS

## 2022-09-01 ENCOUNTER — Ambulatory Visit: Payer: 59 | Attending: Radiation Oncology | Admitting: Radiation Oncology

## 2022-09-07 ENCOUNTER — Institutional Professional Consult (permissible substitution): Payer: 59 | Admitting: Radiation Oncology

## 2022-09-16 ENCOUNTER — Encounter: Payer: Self-pay | Admitting: Radiation Oncology

## 2022-09-16 ENCOUNTER — Ambulatory Visit
Admission: RE | Admit: 2022-09-16 | Discharge: 2022-09-16 | Disposition: A | Discharge status: Home / Self Care | Payer: 59 | Source: Ambulatory Visit | Attending: Radiation Oncology | Admitting: Radiation Oncology

## 2022-09-16 VITALS — BP 145/99 | HR 101 | Temp 98.0°F | Resp 24 | Ht 65.0 in | Wt 280.7 lb

## 2022-09-16 DIAGNOSIS — C3411 Malignant neoplasm of upper lobe, right bronchus or lung: Secondary | ICD-10-CM | POA: Insufficient documentation

## 2022-09-16 DIAGNOSIS — Z510 Encounter for antineoplastic radiation therapy: Secondary | ICD-10-CM | POA: Insufficient documentation

## 2022-09-16 DIAGNOSIS — R911 Solitary pulmonary nodule: Secondary | ICD-10-CM

## 2022-09-16 NOTE — Consult Note (Signed)
NEW PATIENT EVALUATION  Name: Brandi Fowler  MRN: [id number]  Date:   09/16/2022     DOB: 03-08-1966   This 57 y.o. female patient presents to the clinic for initial evaluation of stage I non-small cell lung cancer of the right upper lobe.  REFERRING PHYSICIAN: Mertie Moores, MD  CHIEF COMPLAINT:  Chief Complaint  Patient presents with   Solitary Lung Nodule    DIAGNOSIS: The encounter diagnosis was Solitary lung nodule.   PREVIOUS INVESTIGATIONS:  PET CT and CT scans reviewed Labs reviewed Clinical notes reviewed  HPI: Patient is a 57 year old female on respiratory assistance with COPD, obstructive sleep apnea marked shortness of breath.  She was noted to have a nodule in the right upper lobe which is slowly increasing in size over time.  Last CT scan in May showed an 8 mm right upper lobe nodule enlarging since prior CT scan.  She has stable 6 mm subpleural nodule left lower lobe.  She also has some stable small scattered mediastinal nodes likely reactive.  She had a PET CT scan in June showing hypermetabolic 8 mm right upper lobe nodule favoring primary bronchogenic carcinoma.  A 9 mm right paratracheal node showed low-level activity similar to mediastinal blood pool.  No med evidence of hypermetabolic distant metastatic disease.  She does have chronic cough she has marked obesity and is on nasal oxygen.  Pulmonology is hesitant to do a biopsy which I agree with and she is here to discuss empiric radiation therapy using SBRT.  PLANNED TREATMENT REGIMEN: SBRT  PAST MEDICAL HISTORY:  has a past medical history of BRBPR (bright red blood per rectum), Chronic diarrhea, Chronic kidney disease, Chronic myofascial pain, Chronic pain, COPD (chronic obstructive pulmonary disease) (HCC), DDD (degenerative disc disease), cervical, DDD (degenerative disc disease), cervical, Depression, GERD (gastroesophageal reflux disease), Headache, Kidney stones, Obesity, Class III, BMI 40-49.9 (morbid  obesity) (HCC), Schizophrenia (HCC), and Seizures (HCC).    PAST SURGICAL HISTORY:  Past Surgical History:  Procedure Laterality Date   CHOLECYSTECTOMY     COLONOSCOPY WITH PROPOFOL N/A 11/18/2014   Procedure: COLONOSCOPY WITH PROPOFOL;  Surgeon: Scot Jun, MD;  [facility] END[facility]COPY;  Service: Endoscopy;  Laterality: N/A;   ESOPHAGOGASTRODUODENOSCOPY     KNEE SURGERY Left    polpectomy N/A    SPINE SURGERY     TUBAL LIGATION      FAMILY HISTORY: family history includes Alcohol abuse in her father and mother; COPD in her mother; Cancer in her mother; Depression in her father; Early death in her father; Hearing loss in her mother; Mental illness in her father; Vision loss in her mother.  SOCIAL HISTORY:  reports that she has been smoking cigarettes. She has a 11.3 pack-year smoking history. She has quit using smokeless tobacco. She reports current alcohol use of about 1.0 standard drink of alcohol per week. She reports that she does not use drugs.  ALLERGIES: Aripiprazole, Baclofen, Buprenorphine-naloxone, Clonazepam, Cyclobenzaprine, Doxepin, Fentanyl, Lurasidone, Methadone, Oxymorphone, Penicillins, Pregabalin, Quetiapine, Tolmetin, Trazodone, Acetaminophen, Bupropion, Colestipol, Gabapentin, Hydrocodone, Paroxetine, Sertraline, Amitriptyline hcl, Bromfenac, Ciprofloxacin hcl, Clindamycin/lincomycin, Codeine, Etodolac, Flurbiprofen, Haloperidol, Ibuprofen, Indocin [indomethacin], Ketoprofen, Latuda [lurasidone hcl], Meloxicam, Nabumetone, Naproxen, Opana [oxymorphone hcl], Oxaprozin, Oxycodone, Seroquel [quetiapine fumarate], Suboxone [buprenorphine hcl-naloxone hcl], Tramadol, Valproic acid, Zoloft [sertraline hcl], Buprenorphine, Ciprofloxacin, Clindamycin, Metronidazole, and Mirtazapine  MEDICATIONS:  Current Outpatient Medications  Medication Sig Dispense Refill   albuterol (ACCUNEB) 1.25 MG/3ML nebulizer solution Take 1 ampule by nebulization every 6 (six) hours as needed for  wheezing.     albuterol (VENTOLIN HFA) 108 (90 Base) MCG/ACT inhaler Inhale 2 puffs into the lungs every 4 (four) hours as needed.     azelastine (ASTELIN) 0.1 % nasal spray Place 1 spray into both nostrils 2 (two) times daily. Use in each nostril as directed     fluticasone (FLONASE) 50 MCG/ACT nasal spray Place 1 spray into both nostrils daily.  2   ipratropium-albuterol (DUONEB) 0.5-2.5 (3) MG/3ML SOLN Take 3 mLs by nebulization.     naloxone (NARCAN) nasal spray 4 mg/0.1 mL CALL 911. INSTILL 1 SPRAY IN ONE NOSTRIL. MAY REPEAT Q 2 TO 3 MINUTES IF SYMPTOMS OF AN OPIOID EMERGENCY PERSISTS. ALTERNATE NOSTRILS.     Oxycodone HCl 10 MG TABS Take 10 mg by mouth every 6 (six) hours as needed.     predniSONE (DELTASONE) 5 MG tablet Take 1 tablet by mouth daily with breakfast.     roflumilast (DALIRESP) 500 MCG TABS tablet Take 1 tablet by mouth daily.     rOPINIRole (REQUIP) 0.5 MG tablet Take 1 tablet (0.5 mg total) by mouth at bedtime. (Patient taking differently: Take 1 mg by mouth at bedtime.) 30 tablet 11   TRELEGY ELLIPTA 100-62.5-25 MCG/ACT AEPB Inhale 1 puff into the lungs daily.     No current facility-administered medications for this encounter.    ECOG PERFORMANCE STATUS:  0 - Asymptomatic  REVIEW OF SYSTEMS: Patient has history of COPD with acute exacerbation community-acquired pneumonia respiratory failure with hypoxia schizophrenia GERD shortness of breath Patient denies any weight loss, fatigue, weakness, fever, chills or night sweats. Patient denies any loss of vision, blurred vision. Patient denies any ringing  of the ears or hearing loss. No irregular heartbeat. Patient denies heart murmur or history of fainting. Patient denies any chest pain or pain radiating to her upper extremities. Patient denies any shortness of breath, difficulty breathing at night, cough or hemoptysis. Patient denies any swelling in the lower legs. Patient denies any nausea vomiting, vomiting of blood, or coffee  ground material in the vomitus. Patient denies any stomach pain. Patient states has had normal bowel movements no significant constipation or diarrhea. Patient denies any dysuria, hematuria or significant nocturia. Patient denies any problems walking, swelling in the joints or loss of balance. Patient denies any skin changes, loss of hair or loss of weight. Patient denies any excessive worrying or anxiety or significant depression. Patient denies any problems with insomnia. Patient denies excessive thirst, polyuria, polydipsia. Patient denies any swollen glands, patient denies easy bruising or easy bleeding. Patient denies any recent infections, allergies or URI. Patient "s visual fields have not changed significantly in recent time.   PHYSICAL EXAM: BP (!) 145/99   Pulse (!) 101   Temp 98 F (36.7 C) (Tympanic)   Resp (!) 24   Ht 5\' 5"  (1.651 m)   Wt 280 lb 11.2 oz (127.3 kg)   LMP 08/13/2014 (LMP Unknown) Comment: tubal ligation  BMI 46.71 kg/m  Patient is obese on nasal oxygen.  Well-developed well-nourished patient in NAD. HEENT reveals PERLA, EOMI, discs not visualized.  Oral cavity is clear. No oral mucosal lesions are identified. Neck is clear without evidence of cervical or supraclavicular adenopathy. Lungs are clear to A&P. Cardiac examination is essentially unremarkable with regular rate and rhythm without murmur rub or thrill. Abdomen is benign with no organomegaly or masses noted. Motor sensory and DTR levels are equal and symmetric in the upper and lower extremities. Cranial nerves II through XII are  grossly intact. Proprioception is intact. No peripheral adenopathy or edema is identified. No motor or sensory levels are noted. Crude visual fields are within normal range.  LABORATORY DATA: Labs reviewed    RADIOLOGY RESULTS: CT scan PET CT scans reviewed compatible with above-stated findings   IMPRESSION: Not also lung cancer of the right upper lobe and 57 year old female with  multiple medical comorbidities in 57 year old female  PLAN: Based on the slow growth over time hypermetabolic activity this is a primary bronchogenic carcinoma.  Do not need tissue diagnosis since again this would be difficult based on the patient's comorbidities as well as the small nature of the lesion peripherally in the right upper lobe.  I would offer SBRT treatment 60 Gray over 5 fractions.  Risks and benefits of SBRT including extremely low side effect profile were discussed with the patient.  She comprehends my treatment plan well.  She may develop a slight increase in cough after her treatments otherwise she should have almost very low side effect profile.  I would like to take this opportunity to thank you for allowing me to participate in the care of your patient.Carmina Miller, MD

## 2022-09-22 ENCOUNTER — Ambulatory Visit
Admission: RE | Admit: 2022-09-22 | Discharge: 2022-09-22 | Disposition: A | Discharge status: Home / Self Care | Payer: 59 | Source: Ambulatory Visit | Attending: Radiation Oncology | Admitting: Radiation Oncology

## 2022-09-22 DIAGNOSIS — Z510 Encounter for antineoplastic radiation therapy: Secondary | ICD-10-CM | POA: Diagnosis not present

## 2022-09-23 DIAGNOSIS — Z510 Encounter for antineoplastic radiation therapy: Secondary | ICD-10-CM | POA: Diagnosis not present

## 2022-09-30 ENCOUNTER — Other Ambulatory Visit: Payer: Self-pay

## 2022-09-30 ENCOUNTER — Ambulatory Visit
Admission: RE | Admit: 2022-09-30 | Discharge: 2022-09-30 | Disposition: A | Discharge status: Home / Self Care | Payer: 59 | Source: Ambulatory Visit | Attending: Radiation Oncology | Admitting: Radiation Oncology

## 2022-09-30 DIAGNOSIS — Z510 Encounter for antineoplastic radiation therapy: Secondary | ICD-10-CM | POA: Diagnosis not present

## 2022-09-30 LAB — RAD ONC ARIA SESSION SUMMARY
Course Elapsed Days: 0
Plan Fractions Treated to Date: 1
Plan Prescribed Dose Per Fraction: 18 Gy
Plan Total Fractions Prescribed: 3
Plan Total Prescribed Dose: 54 Gy
Reference Point Dosage Given to Date: 18 Gy
Reference Point Session Dosage Given: 18 Gy
Session Number: 1

## 2022-10-05 ENCOUNTER — Ambulatory Visit
Admission: RE | Admit: 2022-10-05 | Discharge: 2022-10-05 | Disposition: A | Discharge status: Home / Self Care | Payer: 59 | Source: Ambulatory Visit | Attending: Radiation Oncology | Admitting: Radiation Oncology

## 2022-10-05 ENCOUNTER — Other Ambulatory Visit: Payer: Self-pay

## 2022-10-05 DIAGNOSIS — Z510 Encounter for antineoplastic radiation therapy: Secondary | ICD-10-CM | POA: Diagnosis not present

## 2022-10-05 LAB — RAD ONC ARIA SESSION SUMMARY
Course Elapsed Days: 5
Plan Fractions Treated to Date: 2
Plan Prescribed Dose Per Fraction: 18 Gy
Plan Total Fractions Prescribed: 3
Plan Total Prescribed Dose: 54 Gy
Reference Point Dosage Given to Date: 36 Gy
Reference Point Session Dosage Given: 18 Gy
Session Number: 2

## 2022-10-07 ENCOUNTER — Ambulatory Visit: Payer: 59

## 2022-10-11 ENCOUNTER — Ambulatory Visit
Admission: RE | Admit: 2022-10-11 | Discharge: 2022-10-11 | Disposition: A | Discharge status: Home / Self Care | Payer: 59 | Source: Ambulatory Visit | Attending: Radiation Oncology | Admitting: Radiation Oncology

## 2022-10-11 ENCOUNTER — Other Ambulatory Visit: Payer: Self-pay

## 2022-10-11 DIAGNOSIS — C3412 Malignant neoplasm of upper lobe, left bronchus or lung: Secondary | ICD-10-CM | POA: Insufficient documentation

## 2022-10-11 LAB — RAD ONC ARIA SESSION SUMMARY
Course Elapsed Days: 11
Plan Fractions Treated to Date: 3
Plan Prescribed Dose Per Fraction: 18 Gy
Plan Total Fractions Prescribed: 3
Plan Total Prescribed Dose: 54 Gy
Reference Point Dosage Given to Date: 54 Gy
Reference Point Session Dosage Given: 18 Gy
Session Number: 3

## 2022-10-12 ENCOUNTER — Ambulatory Visit: Payer: 59

## 2022-10-14 ENCOUNTER — Ambulatory Visit: Payer: 59

## 2022-10-18 ENCOUNTER — Telehealth: Payer: Self-pay | Admitting: *Deleted

## 2022-10-18 NOTE — Telephone Encounter (Signed)
Patient called to report that she had been coughing up small amounts of pink tinged sputum with tiny flecks of brown. She is not on blood thinners and does not take aspirin. Advise patient that given her symptoms Dr. Rushie Chestnut would like to see her in clinic as soon as possible. Patient will return to clinic tomorrow.

## 2022-10-19 ENCOUNTER — Other Ambulatory Visit: Payer: Self-pay | Admitting: *Deleted

## 2022-10-19 ENCOUNTER — Inpatient Hospital Stay: Payer: 59 | Attending: Radiation Oncology

## 2022-10-19 ENCOUNTER — Ambulatory Visit: Payer: 59 | Admitting: Radiation Oncology

## 2022-10-19 VITALS — BP 105/76 | HR 96 | Temp 97.8°F | Resp 20 | Wt 283.4 lb

## 2022-10-19 DIAGNOSIS — C3411 Malignant neoplasm of upper lobe, right bronchus or lung: Secondary | ICD-10-CM | POA: Insufficient documentation

## 2022-10-19 DIAGNOSIS — C3412 Malignant neoplasm of upper lobe, left bronchus or lung: Secondary | ICD-10-CM

## 2022-10-19 DIAGNOSIS — C3490 Malignant neoplasm of unspecified part of unspecified bronchus or lung: Secondary | ICD-10-CM | POA: Insufficient documentation

## 2022-10-19 LAB — CBC WITH DIFFERENTIAL (CANCER CENTER ONLY)
Abs Immature Granulocytes: 0.04 10*3/uL (ref 0.00–0.07)
Basophils Absolute: 0.1 10*3/uL (ref 0.0–0.1)
Basophils Relative: 1 %
Eosinophils Absolute: 0.1 10*3/uL (ref 0.0–0.5)
Eosinophils Relative: 1 %
HCT: 42.4 % (ref 36.0–46.0)
Hemoglobin: 13.9 g/dL (ref 12.0–15.0)
Immature Granulocytes: 0 %
Lymphocytes Relative: 12 %
Lymphs Abs: 1.3 10*3/uL (ref 0.7–4.0)
MCH: 31.2 pg (ref 26.0–34.0)
MCHC: 32.8 g/dL (ref 30.0–36.0)
MCV: 95.1 fL (ref 80.0–100.0)
Monocytes Absolute: 0.8 10*3/uL (ref 0.1–1.0)
Monocytes Relative: 7 %
Neutro Abs: 8.9 10*3/uL — ABNORMAL HIGH (ref 1.7–7.7)
Neutrophils Relative %: 79 %
Platelet Count: 238 10*3/uL (ref 150–400)
RBC: 4.46 MIL/uL (ref 3.87–5.11)
RDW: 13.6 % (ref 11.5–15.5)
WBC Count: 11.2 10*3/uL — ABNORMAL HIGH (ref 4.0–10.5)
nRBC: 0 % (ref 0.0–0.2)

## 2022-10-19 MED ORDER — AZITHROMYCIN 250 MG PO TABS: ORAL_TABLET | ORAL | 0 refills | Status: DC

## 2022-10-19 NOTE — Progress Notes (Signed)
Radiation Oncology Follow up Note  Name: Brandi Fowler   Date:   10/19/2022 MRN:  [id number] DOB: 1965-07-17    This 57 y.o. female presents to the clinic today for reevaluation 1 week after completing SBRT to her right upper lobe for stage I non-small cell lung cancer.  REFERRING PROVIDER: Mertie Moores, MD  HPI: Patient seen today around her request she has been having some blood-tinged sputum over the last several days.  No fever or chills.  She completed SBRT with very little side effects or complaints low side effect profile..  COMPLICATIONS OF TREATMENT: none  FOLLOW UP COMPLIANCE: keeps appointments   PHYSICAL EXAM:  BP 105/76 (BP Location: Right Arm, Patient Position: Sitting, Cuff Size: Large)   Pulse 96   Temp 97.8 F (36.6 C) (Tympanic)   Resp 20   Wt 283 lb 6.4 oz (128.5 kg)   LMP 08/13/2014 (LMP Unknown) Comment: tubal ligation  BMI 47.16 kg/m  Well-developed well-nourished patient in NAD. HEENT reveals PERLA, EOMI, discs not visualized.  Oral cavity is clear. No oral mucosal lesions are identified. Neck is clear without evidence of cervical or supraclavicular adenopathy. Lungs are clear to A&P. Cardiac examination is essentially unremarkable with regular rate and rhythm without murmur rub or thrill. Abdomen is benign with no organomegaly or masses noted. Motor sensory and DTR levels are equal and symmetric in the upper and lower extremities. Cranial nerves II through XII are grossly intact. Proprioception is intact. No peripheral adenopathy or edema is identified. No motor or sensory levels are noted. Crude visual fields are within normal range.  RADIOLOGY RESULTS: No current films for review  PLAN: At this time obtaining a CBC.  She may have some pneumonia.  Am starting her on a Z-Pak.  I have assured her this will probably stop over the next several days and I will follow it.  Should it persist we will obtain a CT scan of her chest.      Carmina Miller, MD

## 2022-11-15 ENCOUNTER — Encounter: Payer: Self-pay | Admitting: Radiation Oncology

## 2022-11-15 ENCOUNTER — Other Ambulatory Visit: Payer: Self-pay | Admitting: *Deleted

## 2022-11-15 ENCOUNTER — Ambulatory Visit
Admission: RE | Admit: 2022-11-15 | Discharge: 2022-11-15 | Disposition: A | Discharge status: Home / Self Care | Payer: 59 | Source: Ambulatory Visit | Attending: Radiation Oncology | Admitting: Radiation Oncology

## 2022-11-15 VITALS — BP 114/84 | HR 103 | Temp 96.3°F | Resp 22 | Ht 65.0 in | Wt 276.4 lb

## 2022-11-15 DIAGNOSIS — R042 Hemoptysis: Secondary | ICD-10-CM | POA: Insufficient documentation

## 2022-11-15 DIAGNOSIS — C3411 Malignant neoplasm of upper lobe, right bronchus or lung: Secondary | ICD-10-CM | POA: Diagnosis present

## 2022-11-15 DIAGNOSIS — R911 Solitary pulmonary nodule: Secondary | ICD-10-CM

## 2022-11-15 DIAGNOSIS — Z923 Personal history of irradiation: Secondary | ICD-10-CM | POA: Diagnosis not present

## 2022-11-15 NOTE — Progress Notes (Signed)
Radiation Oncology Follow up Note  Name: Brandi Fowler   Date:   11/15/2022 MRN:  [id number] DOB: 1965/09/15    This 57 y.o. female presents to the clinic today for 1 month follow-up status post SBRT to right upper lobe for stage I non-small cell lung cancer.  REFERRING PROVIDER: Mertie Moores, MD  HPI: Patient is a 57 year old female now out 1 month having completed SBRT to her right upper lobe for stage I non-small cell lung cancer.  She continues to have some slight hemoptysis.  She has been on a Z-Pak x 2..  She is seeing pulmonologist for these problems.  She states her overall pulmonary status is not changed.  COMPLICATIONS OF TREATMENT: none  FOLLOW UP COMPLIANCE: keeps appointments   PHYSICAL EXAM:  BP 114/84   Pulse (!) 103   Temp (!) 96.3 F (35.7 C)   Resp (!) 22   Ht 5\' 5"  (1.651 m)   Wt 276 lb 6.4 oz (125.4 kg)   LMP 08/13/2014 (LMP Unknown) Comment: tubal ligation  BMI 46.00 kg/m  Obese female on nasal oxygen in NAD.  Well-developed well-nourished patient in NAD. HEENT reveals PERLA, EOMI, discs not visualized.  Oral cavity is clear. No oral mucosal lesions are identified. Neck is clear without evidence of cervical or supraclavicular adenopathy. Lungs are clear to A&P. Cardiac examination is essentially unremarkable with regular rate and rhythm without murmur rub or thrill. Abdomen is benign with no organomegaly or masses noted. Motor sensory and DTR levels are equal and symmetric in the upper and lower extremities. Cranial nerves II through XII are grossly intact. Proprioception is intact. No peripheral adenopathy or edema is identified. No motor or sensory levels are noted. Crude visual fields are within normal range.  RADIOLOGY RESULTS: CT scan ordered  PLAN: Present time patient is doing well low side effect profile.  Not sure the etiology of her slight hemoptysis.  She is being followed by pulmonology for that.  I have asked to see her back in 3 months prior to  that visit she will have a CT scan of her chest.  Patient knows to call with any concerns.  I would like to take this opportunity to thank you for allowing me to participate in the care of your patient.Carmina Miller, MD

## 2022-11-17 ENCOUNTER — Ambulatory Visit: Payer: 59 | Admitting: Occupational Therapy

## 2022-11-22 ENCOUNTER — Ambulatory Visit: Payer: 59 | Admitting: Occupational Therapy

## 2022-12-01 ENCOUNTER — Ambulatory Visit: Payer: 59 | Attending: Family Medicine | Admitting: Occupational Therapy

## 2022-12-01 DIAGNOSIS — I890 Lymphedema, not elsewhere classified: Secondary | ICD-10-CM | POA: Diagnosis present

## 2022-12-01 NOTE — Therapy (Signed)
OUTPATIENT OCCUPATIONAL THERAPY EVALUATION AND DISCHARGE SUMMARY  LOWER EXTREMITY LYMPHEDEMA  Patient Name: Brandi Fowler MRN: [id number] DOB:10/29/1965, 57 y.o., female Today's Date: 12/01/2022  END OF SESSION:   Past Medical History:  Diagnosis Date   BRBPR (bright red blood per rectum)    Chronic diarrhea    Chronic kidney disease    Chronic myofascial pain    Chronic pain    COPD (chronic obstructive pulmonary disease) (HCC)    DDD (degenerative disc disease), cervical    DDD (degenerative disc disease), cervical    Depression    GERD (gastroesophageal reflux disease)    Headache    migraines   Kidney stones    Obesity, Class III, BMI 40-49.9 (morbid obesity) (HCC)    Schizophrenia (HCC)    Seizures (HCC)    Past Surgical History:  Procedure Laterality Date   CHOLECYSTECTOMY     COLONOSCOPY WITH PROPOFOL N/A 11/18/2014   Procedure: COLONOSCOPY WITH PROPOFOL;  Surgeon: Scot Jun, MD;  Location: [facility] ENDOSCOPY;  Service: Endoscopy;  Laterality: N/A;   ESOPHAGOGASTRODUODENOSCOPY     KNEE SURGERY Left    polpectomy N/A    SPINE SURGERY     TUBAL LIGATION     Patient Active Problem List   Diagnosis Date Noted   Lung cancer (HCC) 10/19/2022   Left leg swelling 05/08/2022   SOB (shortness of breath) 05/07/2022   Respiratory failure with hypoxia (HCC) 05/06/2022   Obstructive sleep apnea syndrome 01/10/2022   CAP (community acquired pneumonia) 08/30/2018   Seizure disorder (HCC) 08/30/2018   Schizophrenia (HCC) 08/30/2018   GERD (gastroesophageal reflux disease) 08/30/2018   Sepsis (HCC) 08/30/2018   Restless legs syndrome 06/15/2018   Chronic venous insufficiency 06/15/2018   Lymphedema 06/15/2018   Leg pain 02/09/2018   COPD with acute exacerbation (HCC) 02/09/2018   DDD (degenerative disc disease), thoracic 07/30/2014   DDD (degenerative disc disease), lumbar 07/30/2014   Thoracic facet syndrome 07/30/2014   Facet syndrome, lumbar 07/30/2014    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   Obesity 09/28/2011   Chronic pain 08/14/2006    PCP: General  REFERRING PROVIDER: Omer Jack , MD  REFERRING DIAG: I89.0  THERAPY DIAG:  Lymphedema, not elsewhere classified  Rationale for Evaluation and Treatment: Rehabilitation  ONSET DATE: unknown  SUBJECTIVE:                                                                                                                                                                                           SUBJECTIVE STATEMENT: Brandi Fowler is referred to Occupational Therapy by Omer Jack , MD for evaluation and treatment of BLE  lymphedema. Pt ambulates slowly to clinic carrying portable 02. Pt is unaccompanied. She is anxious and mildly agitated throughout the visit. She is resistant to interview questions, is verbose and is not receptive to learning about lymphedema etiology and treatment options. She was able to remove her shoes and socks in preparation for examination. She speaks quickly and answers are difficult to understand. Pt refused folder of lymphedema edu handouts. She states , "I am here to get tested, but I'm not getting on that bed ( Rx bed). Pt left 60 minute appointment 30 minutes early.  PERTINENT HISTORY: Contributing factors and conditions relevant to lymphedema: COPD, CVI, Obesity, Lung Ca, Seizure disorder, lumbar, thoracic and cervical DDD;  RLS, Schizophrenia, SOB, chronic pain, Hx depression   PAIN:  Are you having pain? Difficult to assess 2/2 agitation. Pt states , "I hurt all over".  PRECAUTIONS: Other: LYMPHEDEMA: PULMONARY  RED FLAGS: Hx of  Non-compliance with compression by report  WEIGHT BEARING RESTRICTIONS:  NO  FALLS:  Has patient fallen in last 6 months? Unable to assess  LIVING ENVIRONMENT: Lives with: Unable to assess 2/2 agitation Lives in: Other Unable to assess   OCCUPATION: Unable to assess  LEISURE: Unable to assess  HAND DOMINANCE: Unable to  assess   PRIOR LEVEL OF FUNCTION:  Unable to assess  PATIENT GOALS: "To get tested for lymphedema"   OBJECTIVE:  COGNITION:  Overall cognitive status: Unable to assess 2/2 anxiety, agitation throughout session  OBSERVATIONS / OTHER ASSESSMENTS:   POSTURE: head forward  LE ROM: NA. Appears to be WFL LE MMT: NA; appears to be Brandi Fowler  Cancer-related LYMPHEDEMA ASSESSMENTS:  RADIATION:recently completed for lung cancer  HORMONE TREATMENT: n/a  Hx INFECTIONS/ WOUNDS: none noted in medical record   LYMPHEDEMA ASSESSMENTS:    Mild-moderate, Stage  II, Bilateral Lower Extremity Lymphedema, L>R,  2/2 CVI and Obesity  *NA not assessed   Skin  Description Hyper-Keratosis Peau'd' Orange Shiny Tight Fibrotic/ Indurated Fatty Doughy Spongy/ boggy     x x       Skin dry Flaky WNL Macerated   mildly      Color Redness Varicosities Blanching Hemosiderin Stain Mottled   x     x   Odor Malodorous Yeast Fungal infection  WNL   NA      Temperature Warm Cool wnl   NA      Pitting Edema   1+ 2+ 3+ 4+ Non-pitting   NA         Girth Symmetrical Asymmetrical                   Distribution    L>R toes to below knees    Stemmer Sign Positive Negative   NA    Lymphorrhea History Of:  Present Absent   NA      Wounds History Of Present Absent Venous Arterial Pressure Sheer   NA  x        Signs of Infection Redness Warmth Erythema Acute Swelling Drainage Borders           NA         Sensation Light Touch Deep pressure Hypersensitivity   In tact Impaired Present Impaired Absent Impaired   NA         Nails WNL   Fungus nail dystrophy   NA     Hair Growth Symmetrical Asymmetrical   NA    Skin Creases Base of toes  Ankles   Base of Fingers knees  Abdominal pannus Thigh Lobules  Face/neck   NA   x        BLE COMPARATIVE LIMB VOLUMETRICS TBA RX visit 1  LANDMARK RIGHT    R LEG (A-D) n/a  R THIGH (E-G) ml  R FULL LIMB (A-G) ml  Limb Volume  differential (LVD)  %  Volume change since initial %  Volume change overall V  (Blank rows = not tested)  LANDMARK LEFT    L LEG (A-D) ml  L THIGH (E-G) ml  L FULL LIMB (A-G) ml  Limb Volume differential (LVD)  %  Volume change since initial %  Volume change overall %  (Blank rows = not tested)   TODAY'S TREATMENT:                                                                                                                                         OT evaluation Minimal Pt edu   PATIENT EDUCATION:  Education details:  Person educated: Patient Education method: Programmer, multimedia, Facilities manager, and Handouts Education comprehension:  difficulty participating 2/2 agitation and anxiety  HOME EXERCISE PROGRAM: Full time daytime compression B LE  ASSESSMENT:  CLINICAL IMPRESSION: Brandi Fowler is a 21 y o female presenting with mild-moderate, BLE lymphedema, L>R. Pt had  difficulty completing this evaluation as she was anxious and restless throughout the session. She was verbose, frequently talking over the OT's questions and lymphedema education.  When she saw the treatment bed she shouted repeatedly, "I can not get on that bed. It hurts too much." While seated in a firm chair with armrests Pt was able to reach her feet to take her shoes and socks off  and pull her pants legs up for examination. L leg skin condition appears typical of venous stasis with mild generalized swelling, redness and dry, flaking skin. R leg is  less involved milder swelling and redness. Both legs are very hard, dense, and non pitting. Pt stated that she "can not and will not wear compression stockings". She stated she tried them in the past and they don't work. When attempting to educate Pt about lymphedema treatment protocol and self care ho,me program she left the session.Complete Decongestive Therapy (CDT) is a rigorous treatment protocol with a high burden of care. CDT includes manual lymphatic drainage (MLD),  compression wrapping, garments, or alternatives, skin care and therapeutic exercise, To meet OT goals for CDT Brandi Fowler would need to be accompanied to clinic sessions and assisted with all home program components, including compression wrapping and garments. Without the  essential component of compression in a tolerable form that Pt is able to don and doff daily CDT is not effective for lymphedema treatment or self management. Without tolerance for compression Brandi. Deroo' prognosis is poor. Lymphedema will progress and further functional decline is expected.  In lieu of difficulty participation in OT for CDT, adjustable compression wrap style leggings  provide an alternative to garments. Brandi Fowler may benefit from wrap around, Velcro style, adjustable compression leggings that she can easily don and doff, and adjust to comfort. Circaid Juxtafit lower leggings from [employer] are comfortable and durable. They come with soft liners and a slippery cover up to keep Velcro from damaging clothing. Pt would benefit from 1 pr to wash and 1 pair to wear for optimal hygiene. These should be replaced q 6 months and PRN. Pt should be educated by DME vendor how to adjust compression and how to don and doff. The main drawback to these garment alternatives is they do not adequately limit or contain swelling in the feet.  Brandi Fowler is discharged from OT today for CDT. I wish her good health.  OBJECTIVE IMPAIRMENTS: Abnormal gait, cardiopulmonary status limiting activity, decreased activity tolerance, decreased balance, impaired cognition, decreased endurance, decreased knowledge of condition, decreased knowledge of use of DME, decreased mobility, decreased ROM, increased edema, postural dysfunction, obesity, pain, and chronic, progressive leg swelling,  unable to manage lymphedema.   ACTIVITY LIMITATIONS: Functional ambulation and mobility (bending, sitting, standing, squatting, stairs, transfers), Basic and  instrumental ADLs (reaching feet and distal legs to perform lower body bathing and dressing, and inspect skin, perform skin care and hygiene/grooming, difficulty fitting street shoes and socks, shopping), Productive activities and work,   PARTICIPATION LIMITATIONS: meal prep, cleaning, driving, shopping, community activity, and social participation  PERSONAL FACTORS: Behavior pattern, Past/current experiences, Time since onset of injury/illness/exacerbation, Transportation, and 3+ comorbidities: COPD.  OSA, Lung Ca  are also affecting patient's functional outcome.   REHAB POTENTIAL: Poor    CLINICAL DECISION MAKING: Stable/uncomplicated  EVALUATION COMPLEXITY: Moderate   GOALS: Goals reviewed with patient? Yes  SHORT TERM GOALS: Target date: 12/01/22  Pt presents with no knowledge of lymphedema, lymphedema treatment, and lymphedema precautions. By end of session and skilled teaching Pt will understand lymphedema etiology, progression, treatment and self management tools and strategies Baseline: Dependent Goal status:NOT MET PLAN:  OT FREQUENCY:  NA- eval'd, Rx and DC today  OT DURATION: other: one visit  PLANNED INTERVENTIONS: Pt edu, Pt evaluation    Loel Dubonnet, Brandi, OTR/L, CLT-LANA 12/01/22 2:21 PM

## 2022-12-07 NOTE — Addendum Note (Signed)
Addended by: Judithann Sauger on: 12/07/2022 09:28 AM   Modules accepted: Orders

## 2022-12-07 NOTE — Addendum Note (Signed)
Addended by: Judithann Sauger on: 12/07/2022 10:07 AM   Modules accepted: Orders

## 2023-02-14 ENCOUNTER — Ambulatory Visit: Admission: RE | Admit: 2023-02-14 | Payer: 59 | Source: Ambulatory Visit

## 2023-02-24 ENCOUNTER — Ambulatory Visit: Payer: 59 | Admitting: Radiation Oncology

## 2023-02-25 ENCOUNTER — Other Ambulatory Visit: Payer: Self-pay | Admitting: *Deleted

## 2023-02-25 DIAGNOSIS — R911 Solitary pulmonary nodule: Secondary | ICD-10-CM

## 2023-03-30 ENCOUNTER — Other Ambulatory Visit
Admission: RE | Admit: 2023-03-30 | Discharge: 2023-03-30 | Disposition: A | Discharge status: Home / Self Care | Payer: 59 | Source: Ambulatory Visit | Attending: Specialist | Admitting: Specialist

## 2023-03-30 DIAGNOSIS — R0789 Other chest pain: Secondary | ICD-10-CM | POA: Insufficient documentation

## 2023-03-30 LAB — D-DIMER, QUANTITATIVE: D-Dimer, Quant: 0.64 ug{FEU}/mL — ABNORMAL HIGH (ref 0.00–0.50)

## 2023-04-06 ENCOUNTER — Ambulatory Visit
Admission: RE | Admit: 2023-04-06 | Discharge: 2023-04-06 | Disposition: A | Discharge status: Home / Self Care | Payer: 59 | Source: Ambulatory Visit | Attending: Radiation Oncology | Admitting: Radiation Oncology

## 2023-04-06 DIAGNOSIS — R911 Solitary pulmonary nodule: Secondary | ICD-10-CM | POA: Insufficient documentation

## 2023-04-06 MED ORDER — IOHEXOL 300 MG/ML  SOLN
75.0000 mL | Freq: Once | INTRAMUSCULAR | Status: AC | PRN
  Administered 2023-04-06: 75 mL via INTRAVENOUS

## 2023-04-13 ENCOUNTER — Ambulatory Visit: Payer: 59 | Admitting: Radiation Oncology

## 2023-04-13 DIAGNOSIS — Z8701 Personal history of pneumonia (recurrent): Secondary | ICD-10-CM | POA: Insufficient documentation

## 2023-04-13 DIAGNOSIS — R590 Localized enlarged lymph nodes: Secondary | ICD-10-CM | POA: Insufficient documentation

## 2023-04-13 DIAGNOSIS — C3411 Malignant neoplasm of upper lobe, right bronchus or lung: Secondary | ICD-10-CM | POA: Insufficient documentation

## 2023-04-14 LAB — POCT I-STAT CREATININE: Creatinine, Ser: 0.8 mg/dL (ref 0.44–1.00)

## 2023-04-20 ENCOUNTER — Ambulatory Visit
Admission: RE | Admit: 2023-04-20 | Discharge: 2023-04-20 | Disposition: A | Discharge status: Home / Self Care | Payer: 59 | Source: Ambulatory Visit | Attending: Radiation Oncology | Admitting: Radiation Oncology

## 2023-04-20 ENCOUNTER — Other Ambulatory Visit: Payer: Self-pay | Admitting: *Deleted

## 2023-04-20 ENCOUNTER — Encounter: Payer: Self-pay | Admitting: Radiation Oncology

## 2023-04-20 VITALS — BP 132/82 | HR 102 | Temp 97.6°F | Resp 20 | Wt 270.0 lb

## 2023-04-20 DIAGNOSIS — C3412 Malignant neoplasm of upper lobe, left bronchus or lung: Secondary | ICD-10-CM

## 2023-04-20 DIAGNOSIS — C3411 Malignant neoplasm of upper lobe, right bronchus or lung: Secondary | ICD-10-CM | POA: Diagnosis present

## 2023-04-20 DIAGNOSIS — Z8701 Personal history of pneumonia (recurrent): Secondary | ICD-10-CM | POA: Diagnosis not present

## 2023-04-20 DIAGNOSIS — R590 Localized enlarged lymph nodes: Secondary | ICD-10-CM | POA: Diagnosis not present

## 2023-04-20 NOTE — Progress Notes (Signed)
Radiation Oncology Follow up Note  Name: Brandi Fowler   Date:   04/20/2023 MRN:  [id number] DOB: Jun 06, 1965    This 58 y.o. female presents to the clinic today for 31-month follow-up status post SBRT to right upper lobe for stage I non-small cell lung cancer.  REFERRING PROVIDER: [facility]  HPI: Patient is a 58 year old female now out approximately 5 months having completed SBRT to her right upper lobe for stage I non-small cell lung cancer seen today in routine follow-up she states her breathing has worsened she is on continuous nasal oxygen.  She has a cough states she had pneumonia several months ago..  She has been under the care of pulmonary medicine.  She recently had a CT scan of her chest unfortunately showing new mediastinal and hilar lymphadenopathy consistent with metastatic disease the right upper lobe pulmonary nodule has decreased in size.  COMPLICATIONS OF TREATMENT: none  FOLLOW UP COMPLIANCE: keeps appointments   PHYSICAL EXAM:  BP 132/82   Pulse (!) 102   Temp 97.6 F (36.4 C)   Resp 20   Wt 270 lb (122.5 kg)   LMP 08/13/2014 (LMP Unknown) Comment: tubal ligation  BMI 44.93 kg/m  Well-developed obese female on nasal oxygen in NAD.  Well-developed well-nourished patient in NAD. HEENT reveals PERLA, EOMI, discs not visualized.  Oral cavity is clear. No oral mucosal lesions are identified. Neck is clear without evidence of cervical or supraclavicular adenopathy. Lungs are clear to A&P. Cardiac examination is essentially unremarkable with regular rate and rhythm without murmur rub or thrill. Abdomen is benign with no organomegaly or masses noted. Motor sensory and DTR levels are equal and symmetric in the upper and lower extremities. Cranial nerves II through XII are grossly intact. Proprioception is intact. No peripheral adenopathy or edema is identified. No motor or sensory levels are noted. Crude visual fields are within normal range.  RADIOLOGY RESULTS:  CT scans and PET CT scan reviewed compatible with above-stated findings  PLAN: At this time I am running a PET CT scan.  Also referring her to medical oncology for consideration of further treatment.  Patient may need bronchoscopy although she may be poor operative candidate with multiple medical comorbidities and poor pulmonary function.  I have asked to see her back in 4 weeks for follow-up just to keep track of her.  Patient comprehends my recommendations well.  I would like to take this opportunity to thank you for allowing me to participate in the care of your patient.Carmina Miller, MD

## 2023-04-21 ENCOUNTER — Other Ambulatory Visit: Payer: Self-pay | Admitting: *Deleted

## 2023-04-21 DIAGNOSIS — C3412 Malignant neoplasm of upper lobe, left bronchus or lung: Secondary | ICD-10-CM

## 2023-04-22 ENCOUNTER — Ambulatory Visit
Admission: RE | Admit: 2023-04-22 | Discharge: 2023-04-22 | Disposition: A | Discharge status: Home / Self Care | Payer: 59 | Source: Ambulatory Visit | Attending: Radiation Oncology | Admitting: Radiation Oncology

## 2023-04-22 ENCOUNTER — Ambulatory Visit: Admission: RE | Admit: 2023-04-22 | Payer: 59 | Source: Ambulatory Visit

## 2023-04-22 DIAGNOSIS — C3412 Malignant neoplasm of upper lobe, left bronchus or lung: Secondary | ICD-10-CM | POA: Diagnosis present

## 2023-04-22 DIAGNOSIS — I700 Atherosclerosis of aorta: Secondary | ICD-10-CM | POA: Insufficient documentation

## 2023-04-22 DIAGNOSIS — D3501 Benign neoplasm of right adrenal gland: Secondary | ICD-10-CM | POA: Diagnosis not present

## 2023-04-22 DIAGNOSIS — I6523 Occlusion and stenosis of bilateral carotid arteries: Secondary | ICD-10-CM | POA: Insufficient documentation

## 2023-04-22 DIAGNOSIS — R590 Localized enlarged lymph nodes: Secondary | ICD-10-CM | POA: Diagnosis not present

## 2023-04-22 LAB — GLUCOSE, CAPILLARY: Glucose-Capillary: 115 mg/dL — ABNORMAL HIGH (ref 70–99)

## 2023-04-22 MED ORDER — FLUDEOXYGLUCOSE F - 18 (FDG) INJECTION
12.6500 | Freq: Once | INTRAVENOUS | Status: AC | PRN
  Administered 2023-04-22: 12.65 via INTRAVENOUS

## 2023-04-24 DIAGNOSIS — C3412 Malignant neoplasm of upper lobe, left bronchus or lung: Secondary | ICD-10-CM | POA: Insufficient documentation

## 2023-04-24 NOTE — Progress Notes (Deleted)
 [facility] CONSULT NOTE  Patient Care Team: Inc, [facility] as PCP - General Glory Buff, RN as Oncology Nurse Navigator Earna Coder, MD as Consulting Physician (Oncology)  CHIEF COMPLAINTS/PURPOSE OF CONSULTATION:   Oncology History Overview Note  IMPRESSION: 1. New mediastinal and hilar lymphadenopathy consistent with metastatic disease. 2. Decreased size of the right upper lobe pulmonary nodule now measuring 4 mm and previously measured a maximum of 8 mm. 3. No new pulmonary lesions or pulmonary nodules to suggest pulmonary metastatic disease. 4. Stable age advanced atherosclerotic calcifications involving the aorta and coronary arteries. 5. Stable 2.5 cm right adrenal gland adenoma and a 2 cm upper pole right renal cyst. These do not require any further imaging evaluation or follow-up.     Electronically Signed   By: Rudie Meyer M.D.   On: 04/12/2023 14:02  2/14-2025- PET-    Primary cancer of left upper lobe of lung (HCC)  04/24/2023 Initial Diagnosis   Primary cancer of left upper lobe of lung (HCC)   04/24/2023 Cancer Staging   Staging form: Lung, AJCC V9 - Clinical: cT1, cN2, cM0 - Signed by Earna Coder, MD on 04/24/2023   ' ***  HISTORY OF PRESENTING ILLNESS:  Brandi Fowler 58 y.o.  female pleasant patient with a     Review of Systems  Constitutional:  Negative for chills, diaphoresis, fever, malaise/fatigue and weight loss.  HENT:  Negative for nosebleeds and sore throat.   Eyes:  Negative for double vision.  Respiratory:  Negative for cough, hemoptysis, sputum production, shortness of breath and wheezing.   Cardiovascular:  Negative for chest pain, palpitations, orthopnea and leg swelling.  Gastrointestinal:  Negative for abdominal pain, blood in stool, constipation, diarrhea, heartburn, melena, nausea and vomiting.  Genitourinary:  Negative for dysuria, frequency and urgency.  Musculoskeletal:   Negative for back pain and joint pain.  Skin: Negative.  Negative for itching and rash.  Neurological:  Negative for dizziness, tingling, focal weakness, weakness and headaches.  Endo/Heme/Allergies:  Does not bruise/bleed easily.  Psychiatric/Behavioral:  Negative for depression. The patient is not nervous/anxious and does not have insomnia.     MEDICAL HISTORY:  Past Medical History:  Diagnosis Date   BRBPR (bright red blood per rectum)    Chronic diarrhea    Chronic kidney disease    Chronic myofascial pain    Chronic pain    COPD (chronic obstructive pulmonary disease) (HCC)    DDD (degenerative disc disease), cervical    DDD (degenerative disc disease), cervical    Depression    GERD (gastroesophageal reflux disease)    Headache    migraines   Kidney stones    Obesity, Class III, BMI 40-49.9 (morbid obesity) (HCC)    Schizophrenia (HCC)    Seizures (HCC)     SURGICAL HISTORY: Past Surgical History:  Procedure Laterality Date   CHOLECYSTECTOMY     COLONOSCOPY WITH PROPOFOL N/A 11/18/2014   Procedure: COLONOSCOPY WITH PROPOFOL;  Surgeon: Scot Jun, MD;  Location: [facility] ENDOSCOPY;  Service: Endoscopy;  Laterality: N/A;   ESOPHAGOGASTRODUODENOSCOPY     KNEE SURGERY Left    polpectomy N/A    SPINE SURGERY     TUBAL LIGATION      SOCIAL HISTORY: Social History   Socioeconomic History   Marital status: Single    Spouse name: Not on file   Number of children: Not on file   Years of education: Not on file   Highest education  level: Not on file  Occupational History   Not on file  Tobacco Use   Smoking status: Every Day    Current packs/day: 0.25    Average packs/day: 0.3 packs/day for 45.0 years (11.3 ttl pk-yrs)    Types: Cigarettes   Smokeless tobacco: Former  Substance and Sexual Activity   Alcohol use: Yes    Alcohol/week: 1.0 standard drink of alcohol    Types: 1 Standard drinks or equivalent per week   Drug use: No   Sexual activity: Yes  Other  Topics Concern   Not on file  Social History Narrative   Not on file   Social Drivers of Health   Financial Resource Strain: Low Risk  (03/29/2023)   Received from [facility]   Overall Financial Resource Strain (CARDIA)    Difficulty of Paying Living Expenses: Not hard at all  Food Insecurity: No Food Insecurity (03/29/2023)   Received from [facility]   Hunger Vital Sign    Ran Out of Food in the Last Year: Never true    Worried About Running Out of Food in the Last Year: Never true  Transportation Needs: No Transportation Needs (03/29/2023)   Received from [facility]   PRAPARE - Transportation    Lack of Transportation (Non-Medical): No    In the past 12 months, has lack of transportation kept you from medical appointments or from getting medications?: No  Physical Activity: Insufficiently Active (08/31/2018)   Exercise Vital Sign    Days of Exercise per Week: 7 days    Minutes of Exercise per Session: 10 min  Stress: Stress Concern Present (08/31/2018)   [employer] of Occupational Health - Occupational Stress Questionnaire    Feeling of Stress : Very much  Social Connections: Moderately Isolated (08/31/2018)   Social Connection and Isolation Panel [NHANES]    Frequency of Communication with Friends and Family: More than three times a week    Frequency of Social Gatherings with Friends and Family: Once a week    Attends Religious Services: Never    Database administrator or Organizations: No    Attends Banker Meetings: Never    Marital Status: Living with partner  Intimate Partner Violence: Not At Risk (05/07/2022)   Humiliation, Afraid, Rape, and Kick questionnaire    Fear of Current or Ex-Partner: No    Emotionally Abused: No    Physically Abused: No    Sexually Abused: No    FAMILY HISTORY: Family History  Problem Relation Age of Onset   Alcohol abuse Mother    Cancer Mother    COPD Mother     Hearing loss Mother    Vision loss Mother    Alcohol abuse Father    Depression Father    Early death Father    Mental illness Father     ALLERGIES:  is allergic to aripiprazole, baclofen, buprenorphine-naloxone, clonazepam, cyclobenzaprine, doxepin, fentanyl, lurasidone, methadone, oxymorphone, penicillins, pregabalin, quetiapine, tolmetin, trazodone, acetaminophen, bupropion, colestipol, gabapentin, hydrocodone, paroxetine, sertraline, amitriptyline hcl, bromfenac, ciprofloxacin hcl, clindamycin/lincomycin, codeine, etodolac, flurbiprofen, haloperidol, ibuprofen, indocin [indomethacin], ketoprofen, latuda [lurasidone hcl], meloxicam, nabumetone, naproxen, opana [oxymorphone hcl], oxaprozin, oxycodone, seroquel [quetiapine fumarate], suboxone [buprenorphine hcl-naloxone hcl], tramadol, valproic acid, zoloft [sertraline hcl], buprenorphine, ciprofloxacin, clindamycin, metronidazole, and mirtazapine.  MEDICATIONS:  Current Outpatient Medications  Medication Sig Dispense Refill   albuterol (ACCUNEB) 1.25 MG/3ML nebulizer solution Take 1 ampule by nebulization every 6 (six) hours as needed for wheezing.  albuterol (VENTOLIN HFA) 108 (90 Base) MCG/ACT inhaler Inhale 2 puffs into the lungs every 4 (four) hours as needed.     alendronate (FOSAMAX) 70 MG tablet Take 70 mg by mouth once a week.     azelastine (ASTELIN) 0.1 % nasal spray Place 1 spray into both nostrils 2 (two) times daily. Use in each nostril as directed     Cholecalciferol (VITAMIN D3) 1.25 MG (50000 UT) CAPS Take 1 capsule by mouth once a week.     fluticasone (FLONASE) 50 MCG/ACT nasal spray Place 1 spray into both nostrils daily. (Patient not taking: Reported on 04/20/2023)  2   ipratropium-albuterol (DUONEB) 0.5-2.5 (3) MG/3ML SOLN Take 3 mLs by nebulization.     naloxone (NARCAN) nasal spray 4 mg/0.1 mL CALL 911. INSTILL 1 SPRAY IN ONE NOSTRIL. MAY REPEAT Q 2 TO 3 MINUTES IF SYMPTOMS OF AN OPIOID EMERGENCY PERSISTS. ALTERNATE  NOSTRILS.     Oxycodone HCl 10 MG TABS Take 10 mg by mouth every 6 (six) hours as needed.     predniSONE (DELTASONE) 5 MG tablet Take 1 tablet by mouth daily with breakfast.     roflumilast (DALIRESP) 500 MCG TABS tablet Take 1 tablet by mouth daily.     rOPINIRole (REQUIP) 0.5 MG tablet Take 1 tablet (0.5 mg total) by mouth at bedtime. (Patient taking differently: Take 1 mg by mouth at bedtime.) 30 tablet 11   tiZANidine (ZANAFLEX) 4 MG capsule Take 4 mg by mouth every 8 (eight) hours.     TRELEGY ELLIPTA 100-62.5-25 MCG/ACT AEPB Inhale 1 puff into the lungs daily.     No current facility-administered medications for this visit.    PHYSICAL EXAMINATION:   There were no vitals filed for this visit. There were no vitals filed for this visit.  Physical Exam Vitals and nursing note reviewed.  HENT:     Head: Normocephalic and atraumatic.     Mouth/Throat:     Pharynx: Oropharynx is clear.  Eyes:     Extraocular Movements: Extraocular movements intact.     Pupils: Pupils are equal, round, and reactive to light.  Cardiovascular:     Rate and Rhythm: Normal rate and regular rhythm.  Pulmonary:     Comments: Decreased breath sounds bilaterally.  Abdominal:     Palpations: Abdomen is soft.  Musculoskeletal:        General: Normal range of motion.     Cervical back: Normal range of motion.  Skin:    General: Skin is warm.  Neurological:     General: No focal deficit present.     Mental Status: She is alert and oriented to person, place, and time.  Psychiatric:        Behavior: Behavior normal.        Judgment: Judgment normal.     LABORATORY DATA:  I have reviewed the data as listed Lab Results  Component Value Date   WBC 11.2 (H) 10/19/2022   HGB 13.9 10/19/2022   HCT 42.4 10/19/2022   MCV 95.1 10/19/2022   PLT 238 10/19/2022   Recent Labs    05/06/22 1538 05/07/22 2221 05/08/22 0440 04/06/23 1100  NA 138 140 141  --   K 4.0 3.8 3.7  --   CL 103 106 106  --    CO2 28 23 26   --   GLUCOSE 103* 277* 136*  --   BUN 18 26* 26*  --   CREATININE 0.68 1.08* 0.85 0.80  CALCIUM 8.9 9.1  9.1  --   GFRNONAA >60 60* >60  --     RADIOGRAPHIC STUDIES: I have personally reviewed the radiological images as listed and agreed with the findings in the report. CT Chest W Contrast Result Date: 04/12/2023 CLINICAL DATA:  Restaging non-small cell lung cancer. EXAM: CT CHEST WITH CONTRAST TECHNIQUE: Multidetector CT imaging of the chest was performed during intravenous contrast administration. RADIATION DOSE REDUCTION: This exam was performed according to the departmental dose-optimization program which includes automated exposure control, adjustment of the mA and/or kV according to patient size and/or use of iterative reconstruction technique. CONTRAST:  75mL OMNIPAQUE IOHEXOL 300 MG/ML  SOLN COMPARISON:  Chest CT 07/23/2022 and PET-CT 08/23/2022 FINDINGS: Cardiovascular: The heart is normal in size. No pericardial effusion. Stable age advanced atherosclerotic calcifications involving the aorta and coronary arteries. No aortic aneurysm or dissection. Incidental lipomatous hypertrophy of the interatrial septum. The pulmonary arteries are unremarkable. Mediastinum/Nodes: New mediastinal and hilar lymphadenopathy consistent with metastatic disease. The largest right paratracheal node measures 2.9 x 2.6 cm and shows mild central necrosis. This measured 9 mm the prior chest CT. Right hilar node measures 9 mm on image 67/2. Subcarinal node measures 10 mm on image 69/2. The esophagus is unremarkable. Lungs/Pleura: The right upper lobe pulmonary nodule measures 4 mm and previously measured a maximum of 8 mm. Surrounding scarring type changes. No new pulmonary lesions or pulmonary nodules to suggest pulmonary metastatic disease. 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 No acute pulmonary process. No pleural effusions or pleural nodules. Upper Abdomen: No  significant upper abdominal findings. There is a stable 2.5 cm right adrenal gland adenoma and a 2 cm upper pole right renal cyst. These do not require any further imaging evaluation or follow-up. No hepatic lesions. Left adrenal gland is normal. No upper abdominal adenopathy. Stable vascular calcifications. Musculoskeletal: Stable 11 mm subareolar nodule in the right breast. This was negative on the prior PET-CT. No supraclavicular or axillary adenopathy. The bony thorax is intact. No worrisome lytic or sclerotic bone lesions. IMPRESSION: 1. New mediastinal and hilar lymphadenopathy consistent with metastatic disease. 2. Decreased size of the right upper lobe pulmonary nodule now measuring 4 mm and previously measured a maximum of 8 mm. 3. No new pulmonary lesions or pulmonary nodules to suggest pulmonary metastatic disease. 4. Stable age advanced atherosclerotic calcifications involving the aorta and coronary arteries. 5. Stable 2.5 cm right adrenal gland adenoma and a 2 cm upper pole right renal cyst. These do not require any further imaging evaluation or follow-up. Electronically Signed   By: Rudie Meyer M.D.   On: 04/12/2023 14:02     Primary cancer of left upper lobe of lung (HCC)  LOCALLY ADVANCED  #I had long discussion that based on imaging findings highly concerning for malignancy.   I discussed that based on imaging suggestive of early stage malignancy; however hilar/mediastinal lymphadenopathy noted on the CT scan.   # I would recommend further work-up with a PET scan for evaluation of the mediastinum/hilum.  PET scan will help stage cancer.   # Patient need biopsy of lung lesion.  The options for biopsy include-interventional radiology guided or based on pulmonary/surgical evaluation.  Will discuss at tumor conference.  Check PFTs.   # If patient has involvement of the mediastinum lymph nodes involvement; and or stage IIIb/unresectable malignancies-surgery would not be recommended.  The  treatment would be definitive chemoradiation followed by adjuvant maintenance immunotherapy.     Above plan of care was discussed  with patient/family in detail.  My contact information was given to the patient/family.       Earna Coder, MD 04/24/2023 10:10 PM

## 2023-04-24 NOTE — Assessment & Plan Note (Deleted)
 LOCALLY ADVANCED  #I had long discussion that based on imaging findings highly concerning for malignancy.   I discussed that based on imaging suggestive of early stage malignancy; however hilar/mediastinal lymphadenopathy noted on the CT scan.   # I would recommend further work-up with a PET scan for evaluation of the mediastinum/hilum.  PET scan will help stage cancer.   # Patient need biopsy of lung lesion.  The options for biopsy include-interventional radiology guided or based on pulmonary/surgical evaluation.  Will discuss at tumor conference.  Check PFTs.   # If patient has involvement of the mediastinum lymph nodes involvement; and or stage IIIb/unresectable malignancies-surgery would not be recommended.  The treatment would be definitive chemoradiation followed by adjuvant maintenance immunotherapy.

## 2023-04-25 ENCOUNTER — Inpatient Hospital Stay: Payer: 59 | Admitting: Internal Medicine

## 2023-04-25 ENCOUNTER — Inpatient Hospital Stay: Payer: 59

## 2023-04-25 DIAGNOSIS — C3412 Malignant neoplasm of upper lobe, left bronchus or lung: Secondary | ICD-10-CM

## 2023-04-29 ENCOUNTER — Inpatient Hospital Stay: Payer: 59 | Attending: Internal Medicine | Admitting: Internal Medicine

## 2023-04-29 ENCOUNTER — Inpatient Hospital Stay: Payer: 59

## 2023-04-29 ENCOUNTER — Encounter: Payer: Self-pay | Admitting: Internal Medicine

## 2023-04-29 ENCOUNTER — Encounter: Payer: Self-pay | Admitting: *Deleted

## 2023-04-29 VITALS — BP 136/85 | HR 107 | Temp 98.1°F | Resp 18 | Ht 65.0 in | Wt 262.0 lb

## 2023-04-29 DIAGNOSIS — J9610 Chronic respiratory failure, unspecified whether with hypoxia or hypercapnia: Secondary | ICD-10-CM | POA: Diagnosis not present

## 2023-04-29 DIAGNOSIS — F17210 Nicotine dependence, cigarettes, uncomplicated: Secondary | ICD-10-CM | POA: Insufficient documentation

## 2023-04-29 DIAGNOSIS — C3411 Malignant neoplasm of upper lobe, right bronchus or lung: Secondary | ICD-10-CM | POA: Diagnosis present

## 2023-04-29 DIAGNOSIS — R918 Other nonspecific abnormal finding of lung field: Secondary | ICD-10-CM | POA: Insufficient documentation

## 2023-04-29 DIAGNOSIS — C3412 Malignant neoplasm of upper lobe, left bronchus or lung: Secondary | ICD-10-CM

## 2023-04-29 DIAGNOSIS — Z9981 Dependence on supplemental oxygen: Secondary | ICD-10-CM | POA: Diagnosis not present

## 2023-04-29 LAB — CMP (CANCER CENTER ONLY)
ALT: 21 U/L (ref 0–44)
AST: 18 U/L (ref 15–41)
Albumin: 4 g/dL (ref 3.5–5.0)
Alkaline Phosphatase: 81 U/L (ref 38–126)
Anion gap: 11 (ref 5–15)
BUN: 16 mg/dL (ref 6–20)
CO2: 24 mmol/L (ref 22–32)
Calcium: 9.3 mg/dL (ref 8.9–10.3)
Chloride: 101 mmol/L (ref 98–111)
Creatinine: 0.63 mg/dL (ref 0.44–1.00)
GFR, Estimated: >60 mL/min (ref >60)
Glucose, Bld: 104 mg/dL — ABNORMAL HIGH (ref 70–99)
Potassium: 4 mmol/L (ref 3.5–5.1)
Sodium: 136 mmol/L (ref 135–145)
Total Bilirubin: 0.4 mg/dL (ref 0.0–1.2)
Total Protein: 7.6 g/dL (ref 6.5–8.1)

## 2023-04-29 LAB — CBC WITH DIFFERENTIAL (CANCER CENTER ONLY)
Abs Immature Granulocytes: 0.08 10*3/uL — ABNORMAL HIGH (ref 0.00–0.07)
Basophils Absolute: 0.1 10*3/uL (ref 0.0–0.1)
Basophils Relative: 1 %
Eosinophils Absolute: 0.2 10*3/uL (ref 0.0–0.5)
Eosinophils Relative: 2 %
HCT: 42.5 % (ref 36.0–46.0)
Hemoglobin: 14.2 g/dL (ref 12.0–15.0)
Immature Granulocytes: 1 %
Lymphocytes Relative: 13 %
Lymphs Abs: 1.4 10*3/uL (ref 0.7–4.0)
MCH: 31.9 pg (ref 26.0–34.0)
MCHC: 33.4 g/dL (ref 30.0–36.0)
MCV: 95.5 fL (ref 80.0–100.0)
Monocytes Absolute: 0.6 10*3/uL (ref 0.1–1.0)
Monocytes Relative: 5 %
Neutro Abs: 8.3 10*3/uL — ABNORMAL HIGH (ref 1.7–7.7)
Neutrophils Relative %: 78 %
Platelet Count: 321 10*3/uL (ref 150–400)
RBC: 4.45 MIL/uL (ref 3.87–5.11)
RDW: 13.7 % (ref 11.5–15.5)
WBC Count: 10.5 10*3/uL (ref 4.0–10.5)
nRBC: 0 % (ref 0.0–0.2)

## 2023-04-29 LAB — LACTATE DEHYDROGENASE: LDH: 135 U/L (ref 98–192)

## 2023-04-29 NOTE — Progress Notes (Signed)
Met with patient during initial consult with Dr. Donneta Romberg. All questions answered during visit. Pt states that she wants to know if there are anymore resources available to help her quit smoking. Pt given resources for virtual smoking cessation clinic to contact. Informed pt that will be in contact once recommendations are received from [facility] pulmonary regarding biopsy. Contact info given to patient and encouraged to call with any questions or needs. Pt verbalized understanding. Nothing further needed at this time.

## 2023-04-29 NOTE — Assessment & Plan Note (Addendum)
#   MAY 2024- RUL presumed  [poor candidate for biopsy at the time] stage I NCSLC- s/p SBRT.  Follow-up PET scan-January/February 2025- the treated right upper lobe nodule demonstrates no significant residual update. However new enlarging mediastinal and right hilar lymph nodes demonstrated on recent CT are hypermetabolic, consistent with metastatic disease.  No evidence of distant metastatic disease.Stable right adrenal adenoma.  #I had long discussion that based on imaging findings highly concerning for recurrent/progressive malignancy in the mediastinum.  Discussed that given the need for further delineation for systemic chemo given the involvement of the mediastinum-we will recommend biopsy.  Discussed with Dr. A/Dr. Meredeth Ide.  Patient will follow-up post biopsy to discuss the treatment options.  Most likely patient will need concurrent chemoradiation.  Discussed at tumor conference.  # Chronic respiratory failure/COPD on 2 lits [Dr.Fleming]  # Active smoker [since age of 6-8]- tried Chantix- failed multiple interventions.  Again encouraged to quit smoking.  Thank you Dr.Chrystal for allowing me to participate in the care of your pleasant patient. Please do not hesitate to contact me with questions or concerns in the interim.  Discussed with [facility].   # DISPOSITION: # labs- cbc/cmp; CEA; LDH # follow up TBD-Dr.B  # I reviewed the blood work- with the patient in detail; also reviewed the imaging independently [as summarized above]; and with the patient in detail.

## 2023-04-29 NOTE — Progress Notes (Signed)
 [facility] CONSULT NOTE  Patient Care Team: Inc, [facility] Services as PCP - General Glory Buff, RN as Oncology Nurse Navigator Earna Coder, MD as Consulting Physician (Oncology)  CHIEF COMPLAINTS/PURPOSE OF CONSULTATION:   Oncology History Overview Note  IMPRESSION: 1. New mediastinal and hilar lymphadenopathy consistent with metastatic disease. 2. Decreased size of the right upper lobe pulmonary nodule now measuring 4 mm and previously measured a maximum of 8 mm. 3. No new pulmonary lesions or pulmonary nodules to suggest pulmonary metastatic disease. 4. Stable age advanced atherosclerotic calcifications involving the aorta and coronary arteries. 5. Stable 2.5 cm right adrenal gland adenoma and a 2 cm upper pole right renal cyst. These do not require any further imaging evaluation or follow-up.     Electronically Signed   By: Rudie Meyer M.D.   On: 04/12/2023 14:02  2/14-2025- PET-    Primary cancer of left upper lobe of lung (HCC)  04/24/2023 Initial Diagnosis   Primary cancer of left upper lobe of lung (HCC)   04/24/2023 Cancer Staging   Staging form: Lung, AJCC V9 - Clinical: cT1, cN2, cM0 - Signed by Earna Coder, MD on 04/24/2023   ' Lung cancer  HISTORY OF PRESENTING ILLNESS: Patient ambulating-independently.  Alone.  Brandi Fowler 58 y.o.  female pleasant patient with a history of chronic respiratory failure/COPD home O2 active smoker-has been referred to [place] for further evaluation recommendations for lung cancer.  Patient diagnosed with stage I presumed lung cancer in May 2024 status post SBRT.  Patient was felt to be poor candidate for any biopsy at that time.  However more recent imaging showed progressive mediastinal/hilar adenopathy-referred to oncology for further evaluation recommendations.  Patient unfortunately continues to have chronic shortness of breath chronic cough.  Also unfortunately continues to  smoke.  Patient said that she has tried multiple times to quit smoking.  Review of Systems  Constitutional:  Positive for malaise/fatigue. Negative for chills, diaphoresis, fever and weight loss.  HENT:  Negative for nosebleeds and sore throat.   Eyes:  Negative for double vision.  Respiratory:  Positive for cough and shortness of breath. Negative for hemoptysis, sputum production and wheezing.   Cardiovascular:  Negative for chest pain, palpitations, orthopnea and leg swelling.  Gastrointestinal:  Negative for abdominal pain, blood in stool, constipation, diarrhea, heartburn, melena, nausea and vomiting.  Genitourinary:  Negative for dysuria, frequency and urgency.  Musculoskeletal:  Positive for back pain and joint pain.  Skin: Negative.  Negative for itching and rash.  Neurological:  Negative for dizziness, tingling, focal weakness, weakness and headaches.  Endo/Heme/Allergies:  Does not bruise/bleed easily.  Psychiatric/Behavioral:  Negative for depression. The patient is not nervous/anxious and does not have insomnia.     MEDICAL HISTORY:  Past Medical History:  Diagnosis Date   BRBPR (bright red blood per rectum)    Chronic diarrhea    Chronic kidney disease    Chronic myofascial pain    Chronic pain    COPD (chronic obstructive pulmonary disease) (HCC)    DDD (degenerative disc disease), cervical    DDD (degenerative disc disease), cervical    Depression    GERD (gastroesophageal reflux disease)    Headache    migraines   Kidney stones    Obesity, Class III, BMI 40-49.9 (morbid obesity) (HCC)    Schizophrenia (HCC)    Seizures (HCC)     SURGICAL HISTORY: Past Surgical History:  Procedure Laterality Date   CHOLECYSTECTOMY  COLONOSCOPY WITH PROPOFOL N/A 11/18/2014   Procedure: COLONOSCOPY WITH PROPOFOL;  Surgeon: Scot Jun, MD;  Location: [facility] ENDOSCOPY;  Service: Endoscopy;  Laterality: N/A;   ESOPHAGOGASTRODUODENOSCOPY     KNEE SURGERY Left    polpectomy  N/A    SPINE SURGERY     TUBAL LIGATION      SOCIAL HISTORY: Social History   Socioeconomic History   Marital status: Single    Spouse name: Not on file   Number of children: Not on file   Years of education: Not on file   Highest education level: Not on file  Occupational History   Not on file  Tobacco Use   Smoking status: Every Day    Current packs/day: 0.25    Average packs/day: 0.3 packs/day for 45.0 years (11.3 ttl pk-yrs)    Types: Cigarettes   Smokeless tobacco: Former  Substance and Sexual Activity   Alcohol use: Yes    Alcohol/week: 1.0 standard drink of alcohol    Types: 1 Standard drinks or equivalent per week   Drug use: No   Sexual activity: Yes  Other Topics Concern   Not on file  Social History Narrative   Not on file   Social Drivers of Health   Financial Resource Strain: Low Risk  (03/29/2023)   Received from [facility]   Overall Financial Resource Strain (CARDIA)    Difficulty of Paying Living Expenses: Not hard at all  Food Insecurity: No Food Insecurity (03/29/2023)   Received from [facility]   Hunger Vital Sign    Ran Out of Food in the Last Year: Never true    Worried About Running Out of Food in the Last Year: Never true  Transportation Needs: No Transportation Needs (03/29/2023)   Received from [facility]   PRAPARE - Transportation    Lack of Transportation (Non-Medical): No    In the past 12 months, has lack of transportation kept you from medical appointments or from getting medications?: No  Physical Activity: Insufficiently Active (08/31/2018)   Exercise Vital Sign    Days of Exercise per Week: 7 days    Minutes of Exercise per Session: 10 min  Stress: Stress Concern Present (08/31/2018)   [employer] of Occupational Health - Occupational Stress Questionnaire    Feeling of Stress : Very much  Social Connections: Moderately Isolated (08/31/2018)   Social Connection and  Isolation Panel [NHANES]    Frequency of Communication with Friends and Family: More than three times a week    Frequency of Social Gatherings with Friends and Family: Once a week    Attends Religious Services: Never    Database administrator or Organizations: No    Attends Banker Meetings: Never    Marital Status: Living with partner  Intimate Partner Violence: Not At Risk (05/07/2022)   Humiliation, Afraid, Rape, and Kick questionnaire    Fear of Current or Ex-Partner: No    Emotionally Abused: No    Physically Abused: No    Sexually Abused: No    FAMILY HISTORY: Family History  Problem Relation Age of Onset   Alcohol abuse Mother    Cancer Mother    COPD Mother    Hearing loss Mother    Vision loss Mother    Alcohol abuse Father    Depression Father    Early death Father    Mental illness Father     ALLERGIES:  is allergic to aripiprazole,  baclofen, buprenorphine-naloxone, clonazepam, cyclobenzaprine, doxepin, fentanyl, lurasidone, methadone, oxymorphone, penicillins, pregabalin, quetiapine, tolmetin, trazodone, acetaminophen, bupropion, colestipol, gabapentin, hydrocodone, paroxetine, sertraline, amitriptyline hcl, bromfenac, ciprofloxacin hcl, clindamycin/lincomycin, codeine, etodolac, flurbiprofen, haloperidol, ibuprofen, indocin [indomethacin], ketoprofen, latuda [lurasidone hcl], meloxicam, nabumetone, naproxen, opana [oxymorphone hcl], oxaprozin, oxycodone, seroquel [quetiapine fumarate], suboxone [buprenorphine hcl-naloxone hcl], tramadol, valproic acid, zoloft [sertraline hcl], buprenorphine, ciprofloxacin, clindamycin, metronidazole, and mirtazapine.  MEDICATIONS:  Current Outpatient Medications  Medication Sig Dispense Refill   albuterol (ACCUNEB) 1.25 MG/3ML nebulizer solution Take 1 ampule by nebulization every 6 (six) hours as needed for wheezing.     albuterol (VENTOLIN HFA) 108 (90 Base) MCG/ACT inhaler Inhale 2 puffs into the lungs every 4 (four)  hours as needed.     alendronate (FOSAMAX) 70 MG tablet Take 70 mg by mouth once a week.     azelastine (ASTELIN) 0.1 % nasal spray Place 1 spray into both nostrils 2 (two) times daily. Use in each nostril as directed     fluticasone (FLONASE) 50 MCG/ACT nasal spray Place 1 spray into both nostrils daily.  2   ipratropium-albuterol (DUONEB) 0.5-2.5 (3) MG/3ML SOLN Take 3 mLs by nebulization.     naloxone (NARCAN) nasal spray 4 mg/0.1 mL CALL 911. INSTILL 1 SPRAY IN ONE NOSTRIL. MAY REPEAT Q 2 TO 3 MINUTES IF SYMPTOMS OF AN OPIOID EMERGENCY PERSISTS. ALTERNATE NOSTRILS.     Oxycodone HCl 10 MG TABS Take 10 mg by mouth every 6 (six) hours as needed.     predniSONE (DELTASONE) 5 MG tablet Take 1 tablet by mouth daily with breakfast.     roflumilast (DALIRESP) 500 MCG TABS tablet Take 1 tablet by mouth daily.     rOPINIRole (REQUIP) 0.5 MG tablet Take 1 tablet (0.5 mg total) by mouth at bedtime. (Patient taking differently: Take 1 mg by mouth at bedtime.) 30 tablet 11   tiZANidine (ZANAFLEX) 4 MG capsule Take 4 mg by mouth every 8 (eight) hours.     TRELEGY ELLIPTA 100-62.5-25 MCG/ACT AEPB Inhale 1 puff into the lungs daily.     Cholecalciferol (VITAMIN D3) 1.25 MG (50000 UT) CAPS Take 1 capsule by mouth once a week. (Patient not taking: Reported on 04/29/2023)     No current facility-administered medications for this visit.    PHYSICAL EXAMINATION:   Vitals:   04/29/23 1341  BP: 136/85  Pulse: (!) 107  Resp: 18  Temp: 98.1 F (36.7 C)  SpO2: 99%   Filed Weights   04/29/23 1341  Weight: 262 lb (118.8 kg)    Physical Exam Vitals and nursing note reviewed.  HENT:     Head: Normocephalic and atraumatic.     Mouth/Throat:     Pharynx: Oropharynx is clear.  Eyes:     Extraocular Movements: Extraocular movements intact.     Pupils: Pupils are equal, round, and reactive to light.  Cardiovascular:     Rate and Rhythm: Normal rate and regular rhythm.  Pulmonary:     Comments:  Decreased breath sounds bilaterally.  Abdominal:     Palpations: Abdomen is soft.  Musculoskeletal:        General: Normal range of motion.     Cervical back: Normal range of motion.  Skin:    General: Skin is warm.  Neurological:     General: No focal deficit present.     Mental Status: She is alert and oriented to person, place, and time.  Psychiatric:        Behavior: Behavior normal.  Judgment: Judgment normal.     LABORATORY DATA:  I have reviewed the data as listed Lab Results  Component Value Date   WBC 10.5 04/29/2023   HGB 14.2 04/29/2023   HCT 42.5 04/29/2023   MCV 95.5 04/29/2023   PLT 321 04/29/2023   Recent Labs    05/07/22 2221 05/08/22 0440 04/06/23 1100 04/29/23 1458  NA 140 141  --  136  K 3.8 3.7  --  4.0  CL 106 106  --  101  CO2 23 26  --  24  GLUCOSE 277* 136*  --  104*  BUN 26* 26*  --  16  CREATININE 1.08* 0.85 0.80 0.63  CALCIUM 9.1 9.1  --  9.3  GFRNONAA 60* >60  --  >60  PROT  --   --   --  7.6  ALBUMIN  --   --   --  4.0  AST  --   --   --  18  ALT  --   --   --  21  ALKPHOS  --   --   --  81  BILITOT  --   --   --  0.4    RADIOGRAPHIC STUDIES: I have personally reviewed the radiological images as listed and agreed with the findings in the report. NM PET Image Restag (PS) Skull Base To Thigh Result Date: 04/27/2023 CLINICAL DATA:  Subsequent treatment strategy for non-small cell lung cancer. EXAM: NUCLEAR MEDICINE PET SKULL BASE TO THIGH TECHNIQUE: 12.65 mCi F-18 FDG was injected intravenously. Full-ring PET imaging was performed from the skull base to thigh after the radiotracer. CT data was obtained and used for attenuation correction and anatomic localization. Fasting blood glucose: 115 mg/dl COMPARISON:  PET-CT 09/81/1914.  Chest CT 04/06/2023 and 07/23/2022. FINDINGS: Mediastinal blood pool activity: SUV max 2.7 NECK: No hypermetabolic cervical lymph nodes are identified. No suspicious activity identified within the pharyngeal  mucosal space. Incidental CT findings: Bilateral carotid atherosclerosis. CHEST: The enlarging mediastinal lymph nodes demonstrated on recent CT are hypermetabolic. The dominant right paratracheal node measuring 2.9 x 2.6 cm on image 49/6 has an SUV max of 22.9. There is also hypermetabolic activity within the adjacent smaller right paratracheal and right hilar lymph nodes, including a right hilar node with an SUV max of 5.9. No contralateral hilar or axillary hypermetabolic adenopathy. No significant residual hypermetabolic activity within the treated right upper lobe nodule which measures 6 mm on image 56/6 and has an SUV max of 2.0. No hypermetabolic pulmonary activity or suspicious nodularity. Incidental CT findings: Aortic and coronary artery atherosclerosis. A previously demonstrated small subpleural nodule in the left lower lobe is not well visualized, but demonstrates no hypermetabolic activity. ABDOMEN/PELVIS: There is no hypermetabolic activity within the liver, adrenal glands, spleen or pancreas. There is no hypermetabolic nodal activity in the abdomen or pelvis. Incidental CT findings: Hepatic steatosis. Stable right adrenal adenoma measuring 3.0 x 2.0 cm on image 79/6 with a density of -11 HU. Stable parenchymal calcification within the interpolar region of the right kidney and bilateral renal cysts; no specific follow-up imaging recommended. Previous cholecystectomy and bilateral tubal ligation. Aortoiliac atherosclerosis. SKELETON: There is no hypermetabolic activity to suggest osseous metastatic disease. Incidental CT findings: Previous lower cervical fusion. IMPRESSION: 1. The enlarging mediastinal and right hilar lymph nodes demonstrated on recent CT are hypermetabolic, consistent with metastatic disease. 2. No evidence of distant metastatic disease. 3. The treated right upper lobe nodule demonstrates no significant residual hypermetabolic activity. No  new or enlarging nodules identified. 4. Stable  right adrenal adenoma. 5.  Aortic Atherosclerosis (ICD10-I70.0). Electronically Signed   By: Carey Bullocks M.D.   On: 04/27/2023 15:30   CT Chest W Contrast Result Date: 04/12/2023 CLINICAL DATA:  Restaging non-small cell lung cancer. EXAM: CT CHEST WITH CONTRAST TECHNIQUE: Multidetector CT imaging of the chest was performed during intravenous contrast administration. RADIATION DOSE REDUCTION: This exam was performed according to the departmental dose-optimization program which includes automated exposure control, adjustment of the mA and/or kV according to patient size and/or use of iterative reconstruction technique. CONTRAST:  75mL OMNIPAQUE IOHEXOL 300 MG/ML  SOLN COMPARISON:  Chest CT 07/23/2022 and PET-CT 08/23/2022 FINDINGS: Cardiovascular: The heart is normal in size. No pericardial effusion. Stable age advanced atherosclerotic calcifications involving the aorta and coronary arteries. No aortic aneurysm or dissection. Incidental lipomatous hypertrophy of the interatrial septum. The pulmonary arteries are unremarkable. Mediastinum/Nodes: New mediastinal and hilar lymphadenopathy consistent with metastatic disease. The largest right paratracheal node measures 2.9 x 2.6 cm and shows mild central necrosis. This measured 9 mm the prior chest CT. Right hilar node measures 9 mm on image 67/2. Subcarinal node measures 10 mm on image 69/2. The esophagus is unremarkable. Lungs/Pleura: The right upper lobe pulmonary nodule measures 4 mm and previously measured a maximum of 8 mm. Surrounding scarring type changes. No new pulmonary lesions or pulmonary nodules to suggest pulmonary metastatic disease. 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 No acute pulmonary process. No pleural effusions or pleural nodules. Upper Abdomen: No significant upper abdominal findings. There is a stable 2.5 cm right adrenal gland adenoma and a 2 cm upper pole right renal cyst. These do not require any  further imaging evaluation or follow-up. No hepatic lesions. Left adrenal gland is normal. No upper abdominal adenopathy. Stable vascular calcifications. Musculoskeletal: Stable 11 mm subareolar nodule in the right breast. This was negative on the prior PET-CT. No supraclavicular or axillary adenopathy. The bony thorax is intact. No worrisome lytic or sclerotic bone lesions. IMPRESSION: 1. New mediastinal and hilar lymphadenopathy consistent with metastatic disease. 2. Decreased size of the right upper lobe pulmonary nodule now measuring 4 mm and previously measured a maximum of 8 mm. 3. No new pulmonary lesions or pulmonary nodules to suggest pulmonary metastatic disease. 4. Stable age advanced atherosclerotic calcifications involving the aorta and coronary arteries. 5. Stable 2.5 cm right adrenal gland adenoma and a 2 cm upper pole right renal cyst. These do not require any further imaging evaluation or follow-up. Electronically Signed   By: Rudie Meyer M.D.   On: 04/12/2023 14:02    Primary cancer of left upper lobe of lung (HCC) # MAY 2024- RUL presumed  [poor candidate for biopsy at the time] stage I NCSLC- s/p SBRT.  Follow-up PET scan-January/February 2025- the treated right upper lobe nodule demonstrates no significant residual update. However new enlarging mediastinal and right hilar lymph nodes demonstrated on recent CT are hypermetabolic, consistent with metastatic disease.  No evidence of distant metastatic disease.Stable right adrenal adenoma.  #I had long discussion that based on imaging findings highly concerning for recurrent/progressive malignancy in the mediastinum.  Discussed that given the need for further delineation for systemic chemo given the involvement of the mediastinum-we will recommend biopsy.  Discussed with Dr. A/Dr. Meredeth Ide.  Patient will follow-up post biopsy to discuss the treatment options.  Most likely patient will need concurrent chemoradiation.  Discussed at tumor  conference.  # Chronic respiratory failure/COPD on 2 lits [  Dr.Fleming]  # Active smoker [since age of 6-8]- tried Chantix- failed multiple interventions.  Again encouraged to quit smoking.  Thank you Dr.Chrystal for allowing me to participate in the care of your pleasant patient. Please do not hesitate to contact me with questions or concerns in the interim.  Discussed with [facility].   # DISPOSITION: # labs- cbc/cmp; CEA; LDH # follow up TBD-Dr.B  # I reviewed the blood work- with the patient in detail; also reviewed the imaging independently [as summarized above]; and with the patient in detail.    Above plan of care was discussed with patient/family in detail.  My contact information was given to the patient/family.     Earna Coder, MD 05/05/2023 12:45 PM

## 2023-04-29 NOTE — Progress Notes (Signed)
Patient presents today with Malignant neoplasm of upper lobe of left lung. Finished up 5 radiation treatments with Dr.Crystal last year.

## 2023-04-30 LAB — CEA: CEA: 9.1 ng/mL — ABNORMAL HIGH (ref 0.0–4.7)

## 2023-05-05 ENCOUNTER — Other Ambulatory Visit: Payer: 59

## 2023-05-11 ENCOUNTER — Encounter
Admission: RE | Admit: 2023-05-11 | Discharge: 2023-05-11 | Disposition: A | Discharge status: Home / Self Care | Source: Ambulatory Visit | Attending: Pulmonary Disease | Admitting: Pulmonary Disease

## 2023-05-11 ENCOUNTER — Telehealth: Payer: Self-pay | Admitting: Radiation Oncology

## 2023-05-11 ENCOUNTER — Other Ambulatory Visit: Payer: Self-pay | Admitting: Pulmonary Disease

## 2023-05-11 VITALS — Ht 65.0 in | Wt 261.9 lb

## 2023-05-11 DIAGNOSIS — Z9981 Dependence on supplemental oxygen: Secondary | ICD-10-CM

## 2023-05-11 DIAGNOSIS — Z01810 Encounter for preprocedural cardiovascular examination: Secondary | ICD-10-CM

## 2023-05-11 DIAGNOSIS — J441 Chronic obstructive pulmonary disease with (acute) exacerbation: Secondary | ICD-10-CM

## 2023-05-11 HISTORY — DX: Paranoid schizophrenia: F20.0

## 2023-05-11 HISTORY — DX: Localized enlarged lymph nodes: R59.0

## 2023-05-11 HISTORY — DX: Personal history of urinary calculi: Z87.442

## 2023-05-11 HISTORY — DX: Pneumonia, unspecified organism: J18.9

## 2023-05-11 HISTORY — DX: Peripheral vascular disease, unspecified: I73.9

## 2023-05-11 HISTORY — DX: Other abnormal glucose: R73.09

## 2023-05-11 HISTORY — DX: Post-traumatic stress disorder, unspecified: F43.10

## 2023-05-11 HISTORY — DX: Syncope and collapse: R55

## 2023-05-11 HISTORY — DX: Lymphedema, not elsewhere classified: I89.0

## 2023-05-11 HISTORY — DX: Atherosclerotic heart disease of native coronary artery without angina pectoris: I25.10

## 2023-05-11 HISTORY — DX: Hirsutism: L68.0

## 2023-05-11 HISTORY — DX: Other intervertebral disc degeneration, thoracic region: M51.34

## 2023-05-11 HISTORY — DX: Restless legs syndrome: G25.81

## 2023-05-11 HISTORY — DX: Dependence on supplemental oxygen: Z99.81

## 2023-05-11 HISTORY — DX: Obstructive sleep apnea (adult) (pediatric): G47.33

## 2023-05-11 HISTORY — DX: Nicotine dependence, cigarettes, uncomplicated: F17.210

## 2023-05-11 HISTORY — DX: Personal history of adenomatous and serrated colon polyps: Z86.0101

## 2023-05-11 HISTORY — DX: Morbid (severe) obesity due to excess calories: E66.01

## 2023-05-11 HISTORY — DX: Malignant neoplasm of upper lobe, left bronchus or lung: C34.12

## 2023-05-11 HISTORY — DX: Elevated blood-pressure reading, without diagnosis of hypertension: R03.0

## 2023-05-11 HISTORY — DX: Venous insufficiency (chronic) (peripheral): I87.2

## 2023-05-11 HISTORY — DX: Personal history of other infectious and parasitic diseases: Z86.19

## 2023-05-11 HISTORY — DX: Sleep apnea, unspecified: G47.30

## 2023-05-11 NOTE — Patient Instructions (Signed)
 Your procedure is scheduled on:05-13-23 Friday Report to the Registration Desk on the 1st floor of the Medical Mall.Then proceed to the 2nd floor Surgery Desk To find out your arrival time, please call [phone] between 1PM - 3PM on:05-12-23 Thursday If your arrival time is 6:00 am, do not arrive before that time as the Medical Mall entrance doors do not open until 6:00 am.  REMEMBER: Instructions that are not followed completely may result in serious medical risk, up to and including death; or upon the discretion of your surgeon and anesthesiologist your surgery may need to be rescheduled.  Do not eat food after midnight the night before surgery.  No gum chewing or hard candies.  You may however, drink Water up to 2 hours before you are scheduled to arrive for your surgery. Do not drink anything within 2 hours of your scheduled arrival time.  One week prior to surgery:Stop NOW 05-11-23 Stop Anti-inflammatories (NSAIDS) such as Advil, Aleve, Ibuprofen, Motrin, Naproxen, Naprosyn and Aspirin based products such as Excedrin, Goody's Powder, BC Powder. Stop ANY OVER THE COUNTER supplements until after surgery.  You may however, continue to take Tylenol if needed for pain up until the day of surgery.  Continue taking all of your other prescription medications up until the day of surgery.  ON THE DAY OF SURGERY ONLY TAKE THESE MEDICATIONS WITH SIPS OF WATER: -oxyCODONE (ROXICODONE)  -predniSONE (DELTASONE)  -roflumilast (DALIRESP)  -tiZANidine (ZANAFLEX)   Use your Albuterol Nebulizer and your Trelegy Ellipta Inhaler the morning of surgery and bring your Albuterol Inhaler to the hospital  No Alcohol for 24 hours before or after surgery.  No Smoking including e-cigarettes for 24 hours before surgery.  No chewable tobacco products for at least 6 hours before surgery.  No nicotine patches on the day of surgery.  Do not use any "recreational" drugs for at least a week (preferably 2 weeks)  before your surgery.  Please be advised that the combination of cocaine and anesthesia may have negative outcomes, up to and including death. If you test positive for cocaine, your surgery will be cancelled.  On the morning of surgery brush your teeth with toothpaste and water, you may rinse your mouth with mouthwash if you wish. Do not swallow any toothpaste or mouthwash.  Do not wear jewelry, make-up, hairpins, clips or nail polish.  For welded (permanent) jewelry: bracelets, anklets, waist bands, etc.  Please have this removed prior to surgery.  If it is not removed, there is a chance that hospital personnel will need to cut it off on the day of surgery.  Do not wear lotions, powders, or perfumes.   Do not shave body hair from the neck down 48 hours before surgery.  Contact lenses, hearing aids and dentures may not be worn into surgery.  Do not bring valuables to the hospital. [facility] is not responsible for any missing/lost belongings or valuables.  Bring your C-PAP to the hospital  Notify your doctor if there is any change in your medical condition (cold, fever, infection).  Wear comfortable clothing (specific to your surgery type) to the hospital.  After surgery, you can help prevent lung complications by doing breathing exercises.  Take deep breaths and cough every 1-2 hours. Your doctor may order a device called an Incentive Spirometer to help you take deep breaths. When coughing or sneezing, hold a pillow firmly against your incision with both hands. This is called "splinting." Doing this helps protect your incision. It also decreases  belly discomfort.  If you are being admitted to the hospital overnight, leave your suitcase in the car. After surgery it may be brought to your room.  In case of increased patient census, it may be necessary for you, the patient, to continue your postoperative care in the Same Day Surgery department.  If you are being discharged the day of  surgery, you will not be allowed to drive home. You will need a responsible individual to drive you home and stay with you for 24 hours after surgery.   If you are taking public transportation, you will need to have a responsible individual with you.  Please call the Pre-admissions Testing Dept. at [phone] if you have any questions about these instructions.  Surgery Visitation Policy:  Patients having surgery or a procedure may have two visitors.  Children under the age of 54 must have an adult with them who is not the patient.  Temporary Visitor Restrictions Due to increasing cases of flu, RSV and COVID-19: Children ages 22 and under will not be able to visit patients in [facility] hospitals under most circumstances.

## 2023-05-11 NOTE — Telephone Encounter (Signed)
 Pt called and stated that she tried to reach Dr.Chrystal's office and the triage nurse and no one is answering and she wants to speak to someone today. I asked her how I can help and she wants to know why she has an appt with Dr.Chrystal on 3/6. I told her the appt notes state follow up PET.  Pt stated she already had the PET and went over the results already with Dr.Chrystal.   I told her I would send a message to Dr.Chrystal's team and someone would call her back to explain why this appt is scheduled.

## 2023-05-12 ENCOUNTER — Ambulatory Visit: Payer: 59 | Admitting: Radiation Oncology

## 2023-05-12 ENCOUNTER — Encounter
Admission: RE | Admit: 2023-05-12 | Discharge: 2023-05-12 | Disposition: A | Discharge status: Home / Self Care | Source: Ambulatory Visit | Attending: Pulmonary Disease | Admitting: Pulmonary Disease

## 2023-05-12 DIAGNOSIS — Z9981 Dependence on supplemental oxygen: Secondary | ICD-10-CM | POA: Diagnosis not present

## 2023-05-12 DIAGNOSIS — J441 Chronic obstructive pulmonary disease with (acute) exacerbation: Secondary | ICD-10-CM | POA: Insufficient documentation

## 2023-05-12 DIAGNOSIS — Z01810 Encounter for preprocedural cardiovascular examination: Secondary | ICD-10-CM | POA: Diagnosis present

## 2023-05-13 ENCOUNTER — Ambulatory Visit
Admission: RE | Admit: 2023-05-13 | Discharge: 2023-05-13 | Disposition: A | Discharge status: Home / Self Care | Payer: 59 | Attending: Pulmonary Disease | Admitting: Pulmonary Disease

## 2023-05-13 ENCOUNTER — Ambulatory Visit: Payer: Self-pay | Admitting: Urgent Care

## 2023-05-13 ENCOUNTER — Ambulatory Visit

## 2023-05-13 ENCOUNTER — Other Ambulatory Visit: Payer: Self-pay

## 2023-05-13 ENCOUNTER — Encounter
Admission: RE | Disposition: A | Discharge status: Home / Self Care | Payer: Self-pay | Source: Home / Self Care | Attending: Pulmonary Disease

## 2023-05-13 DIAGNOSIS — K219 Gastro-esophageal reflux disease without esophagitis: Secondary | ICD-10-CM | POA: Insufficient documentation

## 2023-05-13 DIAGNOSIS — Z6841 Body Mass Index (BMI) 40.0 and over, adult: Secondary | ICD-10-CM | POA: Insufficient documentation

## 2023-05-13 DIAGNOSIS — T17500A Unspecified foreign body in bronchus causing asphyxiation, initial encounter: Secondary | ICD-10-CM | POA: Insufficient documentation

## 2023-05-13 DIAGNOSIS — N189 Chronic kidney disease, unspecified: Secondary | ICD-10-CM | POA: Diagnosis not present

## 2023-05-13 DIAGNOSIS — X58XXXA Exposure to other specified factors, initial encounter: Secondary | ICD-10-CM | POA: Insufficient documentation

## 2023-05-13 DIAGNOSIS — C3412 Malignant neoplasm of upper lobe, left bronchus or lung: Secondary | ICD-10-CM | POA: Insufficient documentation

## 2023-05-13 DIAGNOSIS — F17210 Nicotine dependence, cigarettes, uncomplicated: Secondary | ICD-10-CM | POA: Insufficient documentation

## 2023-05-13 DIAGNOSIS — G4733 Obstructive sleep apnea (adult) (pediatric): Secondary | ICD-10-CM | POA: Insufficient documentation

## 2023-05-13 DIAGNOSIS — R590 Localized enlarged lymph nodes: Secondary | ICD-10-CM | POA: Diagnosis not present

## 2023-05-13 DIAGNOSIS — C771 Secondary and unspecified malignant neoplasm of intrathoracic lymph nodes: Secondary | ICD-10-CM | POA: Insufficient documentation

## 2023-05-13 DIAGNOSIS — Z9981 Dependence on supplemental oxygen: Secondary | ICD-10-CM | POA: Diagnosis not present

## 2023-05-13 DIAGNOSIS — J449 Chronic obstructive pulmonary disease, unspecified: Secondary | ICD-10-CM | POA: Diagnosis not present

## 2023-05-13 DIAGNOSIS — I2510 Atherosclerotic heart disease of native coronary artery without angina pectoris: Secondary | ICD-10-CM | POA: Insufficient documentation

## 2023-05-13 HISTORY — PX: VIDEO BRONCHOSCOPY WITH ENDOBRONCHIAL ULTRASOUND: SHX6177

## 2023-05-13 HISTORY — PX: FLEXIBLE BRONCHOSCOPY: SHX5094

## 2023-05-13 SURGERY — BRONCHOSCOPY, FLEXIBLE
Anesthesia: General

## 2023-05-13 MED ORDER — PROPOFOL 10 MG/ML IV BOLUS
INTRAVENOUS | Status: AC
  Filled 2023-05-13: qty 20

## 2023-05-13 MED ORDER — ORAL CARE MOUTH RINSE: 15.0000 mL | Freq: Once | OROMUCOSAL | Status: AC

## 2023-05-13 MED ORDER — SUGAMMADEX SODIUM 200 MG/2ML IV SOLN
INTRAVENOUS | Status: DC | PRN
  Administered 2023-05-13: 200 mg via INTRAVENOUS

## 2023-05-13 MED ORDER — CHLORHEXIDINE GLUCONATE 0.12 % MT SOLN
15.0000 mL | Freq: Once | OROMUCOSAL | Status: AC
  Administered 2023-05-13: 15 mL via OROMUCOSAL

## 2023-05-13 MED ORDER — MIDAZOLAM HCL 2 MG/2ML IJ SOLN
INTRAMUSCULAR | Status: AC
  Filled 2023-05-13: qty 2

## 2023-05-13 MED ORDER — FENTANYL CITRATE (PF) 100 MCG/2ML IJ SOLN
INTRAMUSCULAR | Status: AC
  Filled 2023-05-13: qty 2

## 2023-05-13 MED ORDER — SUGAMMADEX SODIUM 200 MG/2ML IV SOLN
INTRAVENOUS | Status: AC
  Filled 2023-05-13: qty 2

## 2023-05-13 MED ORDER — ROCURONIUM BROMIDE 100 MG/10ML IV SOLN
INTRAVENOUS | Status: DC | PRN
  Administered 2023-05-13: 10 mg via INTRAVENOUS

## 2023-05-13 MED ORDER — ONDANSETRON HCL 4 MG/2ML IJ SOLN
INTRAMUSCULAR | Status: DC | PRN
  Administered 2023-05-13: 4 mg via INTRAVENOUS

## 2023-05-13 MED ORDER — LACTATED RINGERS IV SOLN: Freq: Once | INTRAVENOUS | Status: AC

## 2023-05-13 MED ORDER — SUCCINYLCHOLINE CHLORIDE 200 MG/10ML IV SOSY
PREFILLED_SYRINGE | INTRAVENOUS | Status: DC | PRN
  Administered 2023-05-13: 100 mg via INTRAVENOUS

## 2023-05-13 MED ORDER — CHLORHEXIDINE GLUCONATE 0.12 % MT SOLN
OROMUCOSAL | Status: AC
  Filled 2023-05-13: qty 15

## 2023-05-13 MED ORDER — MIDAZOLAM HCL 2 MG/2ML IJ SOLN
INTRAMUSCULAR | Status: DC | PRN
  Administered 2023-05-13 (×2): 1 mg via INTRAVENOUS

## 2023-05-13 MED ORDER — DEXAMETHASONE SODIUM PHOSPHATE 10 MG/ML IJ SOLN
INTRAMUSCULAR | Status: DC | PRN
  Administered 2023-05-13: 5 mg via INTRAVENOUS

## 2023-05-13 MED ORDER — PROPOFOL 10 MG/ML IV BOLUS
INTRAVENOUS | Status: DC | PRN
Start: 2023-05-13 — End: 2023-05-13
  Administered 2023-05-13: 20 mg via INTRAVENOUS
  Administered 2023-05-13: 100 mg via INTRAVENOUS

## 2023-05-13 MED ORDER — LIDOCAINE HCL (CARDIAC) PF 100 MG/5ML IV SOSY
PREFILLED_SYRINGE | INTRAVENOUS | Status: DC | PRN
  Administered 2023-05-13: 70 mg via INTRAVENOUS

## 2023-05-13 MED ORDER — FENTANYL CITRATE (PF) 100 MCG/2ML IJ SOLN
INTRAMUSCULAR | Status: DC | PRN
  Administered 2023-05-13 (×2): 25 ug via INTRAVENOUS

## 2023-05-13 NOTE — Procedures (Signed)
 THERAPEUTIC ASPIRATION OF TRACHEOBRONCHIAL TREE BRONCHOSCOPY PROCEDURE NOTE  FIBEROPTIC BRONCHOSCOPY WITH BRONCHOALVEOLAR LAVAGE PROCEDURE NOTE  ENDOBRONCHIAL ULTRASOUND WITH >/= 3  LYMPH NODE BIOPSY PROCEDURE NOTE    Flexible bronchoscopy was performed  by : Karna Christmas MD  assistance by : 1)Repiratory therapist  and 2)[employer] staff and 3) Anesthesia team   Indication for the procedure was :  Pre-procedural H&P. The following assessment was performed on the day of the procedure prior to initiating sedation History:  Chest pain n Dyspnea y Hemoptysis n Cough y Fever n Other pertinent items n  Examination Vital signs -reviewed as per nursing documentation today Cardiac    Murmurs: n  Rubs : n  Gallop: n Lungs Wheezing: n Rales : n Rhonchi :y  Other pertinent findings: SOB/hypoxemia due to chronic lung disease   Pre-procedural assessment for Procedural Sedation included: Depth of sedation: As per anesthesia team  ASA Classification:  2 Mallampati airway assessment: 3    Medication list reviewed: y  The patient's interval history was taken and revealed: no new complaints The pre- procedure physical examination revealed: No new findings Refer to prior clinic note for details.  Informed Consent: Informed consent was obtained from:  patient after explanation of procedure and risks, benefits, as well as alternative procedures available.  Explanation of level of sedation and possible transfusion was also provided.    Procedural Preparation: Time out was performed and patient was identified by name and birthdate and procedure to be performed and side for sampling, if any, was specified. Pt was intubated by anesthesia.  The patient was appropriately draped.   Fiberoptic bronchoscopy with airway inspection and BAL Procedure findings:  Bronchoscope was inserted via ETT  without difficulty.  Posterior oropharynx, epiglottis, arytenoids, false cords and vocal  cords were not visualized as these were bypassed by endotracheal tube. The distal trachea was normal in circumference and appearance without mucosal, cartilaginous or branching abnormalities.  The main carina was mildly splayed . All right and left lobar airways were visualized to the Subsegmental level.  Sub- sub segmental carinae were identified in all the distal airways.   Secretions were visible in the following airways and appeared to be clear.  The mucosa was : ANTHRACOTIC PIGMENT BILATERALLY  Airways were notable for:        exophytic lesions :n       extrinsic compression in the following distributions: n.       Friable mucosa: y       Teacher, music /pigmentation: Y    BAL was performed at [facility] with mucoid dark return.  Mucus plugging was noted bilaterally and was found worse at RLL and lingular segments of left lung.  BAL sent for microbiology and cell count with diff. This was treated with therapeutic aspiration of tracheobronchial tree.   Post procedure Diagnosis:   mucus plugging of tracheobronchial tree and anthracotic pigment throughout both lungs    Endobronchial ultrasound assisted hilar and mediastinal lymph node biopsies procedure findings: The fiberoptic bronchoscope was removed and the EBUS scope was introduced. Examination began to evaluate for pathologically enlarged lymph nodes starting on the Left side progressing to the right side.  All lymph node biopsies performed with 21g needle. Lymph node biopsies were sent in cytolite for all stations.   STATION 10L - NOT BIOPSIED STATION 7 - BIOPSIED 3 TIMES STATION 10R- 1.6CM BIOPSIED 4 TIMES +RPMI SPECIMEN STATION 4R - 2.2CM BIOPSIED 5 TIMES +RPMI SPECIMEN  Post procedure diagnosis:  ATYPICAL CELLS  NOTED ON SLIDE FROM 4R STATION   Specimens obtained included:   Immediate sampling complications included:none  Epinephrine ZERO ml was used topically  The bronchoscopy was terminated due to completion of  the planned procedure and the bronchoscope was removed.   Total dosage of Lidocaine was ZERO mg Total fluoroscopy time was ZERO  minutes  Supplemental oxygen was provided at AS PER ANESTHESIA lpm by nasal canula post operatively  Estimated Blood loss: EXPECTED <1 cc.  Complications included:  NONE IMMEDIATE   Preliminary CXR findings :  IN PROCESS   Disposition: HOME WITH FAMILY  Follow up with Dr. Karna Christmas in 5 days for result discussion.     Vida Rigger MD  [facility] Division of Pulmonary & Critical Care Medicine

## 2023-05-13 NOTE — Transfer of Care (Signed)
 Immediate Anesthesia Transfer of Care Note  Patient: Avri A Wiechmann  Procedure(s) Performed: BRONCHOSCOPY, FLEXIBLE BRONCHOSCOPY, WITH EBUS  Patient Location: PACU  Anesthesia Type:General  Level of Consciousness: drowsy  Airway & Oxygen Therapy: Patient Spontanous Breathing and Patient connected to face mask oxygen  Post-op Assessment: Report given to RN and Post -op Vital signs reviewed and stable  Post vital signs: Reviewed and stable  Last Vitals:  Vitals Value Taken Time  BP 138/83 05/13/23 1332  Temp 36.2 C 05/13/23 1332  Pulse 98 05/13/23 1332  Resp 28 05/13/23 1332  SpO2 92 % 05/13/23 1332  Vitals shown include unfiled device data.  Last Pain:  Vitals:   05/13/23 1332  TempSrc: Temporal  PainSc:          Complications: No notable events documented.

## 2023-05-13 NOTE — Anesthesia Procedure Notes (Signed)
 Procedure Name: Intubation Date/Time: 05/13/2023 12:40 PM  Performed by: Otho Perl, CRNAPre-anesthesia Checklist: Patient identified, Patient being monitored, Timeout performed, Emergency Drugs available and Suction available Patient Re-evaluated:Patient Re-evaluated prior to induction Oxygen Delivery Method: Circle system utilized Preoxygenation: Pre-oxygenation with 100% oxygen Induction Type: IV induction Ventilation: Mask ventilation without difficulty Laryngoscope Size: 3 and McGrath Grade View: Grade I Tube type: Oral Tube size: 8.0 mm Number of attempts: 1 Airway Equipment and Method: Stylet Placement Confirmation: ETT inserted through vocal cords under direct vision, positive ETCO2 and breath sounds checked- equal and bilateral Secured at: 19 cm Tube secured with: Tape Dental Injury: Teeth and Oropharynx as per pre-operative assessment

## 2023-05-13 NOTE — H&P (Signed)
 PULMONOLOGY         Date: 05/13/2023,   MRN# [id number] Brandi Fowler 10-15-1965     AdmissionWeight: 118.8 kg                 CurrentWeight: 118.8 kg  Referring provider: Dr Donneta Romberg   CHIEF COMPLAINT:   Lung cancer with hilar and mediastinal adenopathy   HISTORY OF PRESENT ILLNESS   This is a 58 yo F with hx of COPD and chronic hypoxemia, lung cancer with Stage I disease of RUL and s/p SBRT.  She has additional comorbid conditions as noted below. She was seen by oncology recently with decrease in size RUL lesion but noted new mediastial and hilar adenopathy.  There is scattered bronchitic changes and mucus plugging throughout. She is here today for airway inspection requested by her oncologist and sampling of lymph nodes for atypia.  Plan to use flexiblle bronchoscopy for airway examination, therapeutic aspiration of tracheobronchial tree and BAL for microbiology. Endobronchial [place] will be utilized for lymph node evaluation.     -Reviewed risks/complications and benefits with patient, risks include infection, pneumothorax/pneumomediastinum which may require chest tube placement as well as overnight/prolonged hospitalization and possible mechanical ventilation. Other risks include bleeding and very rarely death.  Patient understands risks and wishes to proceed.  Additional questions were answered, and patient is aware that post procedure patient will be going home with family and may experience cough with possible clots on expectoration as well as phlegm which may last few days as well as hoarseness of voice post intubation and mechanical ventilation.    PAST MEDICAL HISTORY   Past Medical History:  Diagnosis Date   BRBPR (bright red blood per rectum)    CAP (community acquired pneumonia)    Chronic diarrhea    Chronic kidney disease    Chronic myofascial pain    Chronic pain    Chronic venous insufficiency    Cigarette smoker    COPD (chronic obstructive  pulmonary disease) (HCC)    Coronary artery disease    DDD (degenerative disc disease), cervical    DDD (degenerative disc disease), thoracic    Depression    Elevated blood pressure reading without diagnosis of hypertension    Elevated hemoglobin A1c    Female hirsutism    GERD (gastroesophageal reflux disease)    Headache    migraines   History of adenomatous polyp of colon    History of kidney stones    History of sepsis    Kidney stones    Lymphedema    Mediastinal adenopathy    Morbid obesity (HCC)    Obesity, Class III, BMI 40-49.9 (morbid obesity) (HCC)    OSA (obstructive sleep apnea)    Paranoid schizophrenia (HCC)    Peripheral vascular disease (HCC)    Primary cancer of left upper lobe of lung (HCC)    PTSD (post-traumatic stress disorder)    RLS (restless legs syndrome)    Seizures (HCC)    last seizure in Jan 2025   Sleep apnea    Supplemental oxygen dependent    2-3 L [place]   Syncope      SURGICAL HISTORY   Past Surgical History:  Procedure Laterality Date   CERVICAL SPINE SURGERY     Fusion C6-7   CHOLECYSTECTOMY     COLONOSCOPY WITH PROPOFOL N/A 11/18/2014   Procedure: COLONOSCOPY WITH PROPOFOL;  Surgeon: Scot Jun, MD;  Location: [facility] ENDOSCOPY;  Service: Endoscopy;  Laterality: N/A;  ESOPHAGOGASTRODUODENOSCOPY     KNEE SURGERY Left    polpectomy N/A    TUBAL LIGATION       FAMILY HISTORY   Family History  Problem Relation Age of Onset   Alcohol abuse Mother    Cancer Mother    COPD Mother    Hearing loss Mother    Vision loss Mother    Alcohol abuse Father    Depression Father    Early death Father    Mental illness Father      SOCIAL HISTORY   Social History   Tobacco Use   Smoking status: Every Day    Current packs/day: 0.25    Average packs/day: 0.3 packs/day for 45.0 years (11.3 ttl pk-yrs)    Types: Cigarettes   Smokeless tobacco: Former  Building services engineer status: Never Used  Substance Use Topics   Alcohol  use: Not Currently    Alcohol/week: 1.0 standard drink of alcohol    Types: 1 Standard drinks or equivalent per week   Drug use: No     MEDICATIONS    Home Medication:    Current Medication:  Current Facility-Administered Medications:    lactated ringers infusion, , Intravenous, Once, Reed Breech, MD    ALLERGIES   Aripiprazole, Baclofen, Buprenorphine-naloxone, Clonazepam, Cyclobenzaprine, Doxepin, Fentanyl, Lurasidone, Methadone, Oxymorphone, Penicillins, Pregabalin, Quetiapine, Tolmetin, Trazodone, Acetaminophen, Bupropion, Colestipol, Gabapentin, Hydrocodone, Paroxetine, Sertraline, Amitriptyline hcl, Carbamazepine, Ciprofloxacin hcl, Codeine, Divalproex sodium, Etodolac, Haloperidol, Hyaluronate sodium, Hydrochlorothiazide, Ibuprofen, Indocin [indomethacin], Iodinated contrast media, Ketoprofen, Latuda [lurasidone hcl], Lidocaine, Meloxicam, Metformin and related, Methocarbamol, Nabumetone, Naproxen, Opana [oxymorphone hcl], Oxaprozin, Perphenazine, Risperidone and related, Seroquel [quetiapine fumarate], Silver, Suboxone [buprenorphine hcl-naloxone hcl], Tramadol, Valproic acid, Zoloft [sertraline hcl], Bromfenac, Buprenorphine, Ciprofloxacin, Clindamycin, Clindamycin/lincomycin, Flurbiprofen, Metronidazole, and Mirtazapine     REVIEW OF SYSTEMS    Review of Systems:  Gen:  Denies  fever, sweats, chills weigh loss  HEENT: Denies blurred vision, double vision, ear pain, eye pain, hearing loss, nose bleeds, sore throat Cardiac:  No dizziness, chest pain or heaviness, chest tightness,edema Resp:   reports dyspnea chronically  Gi: Denies swallowing difficulty, stomach pain, nausea or vomiting, diarrhea, constipation, bowel incontinence Gu:  Denies bladder incontinence, burning urine Ext:   Denies Joint pain, stiffness or swelling Skin: Denies  skin rash, easy bruising or bleeding or hives Endoc:  Denies polyuria, polydipsia , polyphagia or weight change Psych:   Denies  depression, insomnia or hallucinations   Other:  All other systems negative   VS: BP 125/71   Pulse (!) 102   Temp 98.4 F (36.9 C) (Oral)   Resp 18   Ht 5\' 5"  (1.651 m)   Wt 118.8 kg   LMP 08/13/2014 (LMP Unknown) Comment: tubal ligation  SpO2 97%   BMI 43.58 kg/m      PHYSICAL EXAM    GENERAL:NAD, no fevers, chills, no weakness no fatigue HEAD: Normocephalic, atraumatic.  EYES: Pupils equal, round, reactive to light. Extraocular muscles intact. No scleral icterus.  MOUTH: Moist mucosal membrane. Dentition intact. No abscess noted.  EAR, NOSE, THROAT: Clear without exudates. No external lesions.  NECK: Supple. No thyromegaly. No nodules. No JVD.  PULMONARY: decreased breath sounds with mild rhonchi worse at bases bilaterally.  CARDIOVASCULAR: S1 and S2. Regular rate and rhythm. No murmurs, rubs, or gallops. No edema. Pedal pulses 2+ bilaterally.  GASTROINTESTINAL: Soft, nontender, nondistended. No masses. Positive bowel sounds. No hepatosplenomegaly.  MUSCULOSKELETAL: No swelling, clubbing, or edema. Range of motion full in all extremities.  NEUROLOGIC: Cranial nerves II through XII are intact. No gross focal neurological deficits. Sensation intact. Reflexes intact.  SKIN: No ulceration, lesions, rashes, or cyanosis. Skin warm and dry. Turgor intact.  PSYCHIATRIC: Mood, affect within normal limits. The patient is awake, alert and oriented x 3. Insight, judgment intact.       IMAGING   CT chest ans PET scan from 2025 and 2024 reviewed  ASSESSMENT/PLAN   Right upper lobe lung cancer with s/p SBRT  New hilar/mediastinal adenopathy Advanced COPD with mucus plugging and bronchitis        - plan for lymph node biopsies via EBUS        - airway inspection and BAL/therapeutic aspiration via flexible bronch   -Reviewed risks/complications and benefits with patient, risks include infection, pneumothorax/pneumomediastinum which may require chest tube placement as well as  overnight/prolonged hospitalization and possible mechanical ventilation. Other risks include bleeding and very rarely death.  Patient understands risks and wishes to proceed.  Additional questions were answered, and patient is aware that post procedure patient will be going home with family and may experience cough with possible clots on expectoration as well as phlegm which may last few days as well as hoarseness of voice post intubation and mechanical ventilation.       Thank you for allowing me to participate in the care of this patient.   Patient/Family are satisfied with care plan and all questions have been answered.    Provider disclosure: Patient with at least one acute or chronic illness or injury that poses a threat to life or bodily function and is being managed actively during this encounter.  All of the below services have been performed independently by signing provider:  review of prior documentation from internal and or external health records.  Review of previous and current lab results.  Interview and comprehensive assessment during patient visit today. Review of current and previous chest radiographs/CT scans. Discussion of management and test interpretation with health care team and patient/family.   This document was prepared using Dragon voice recognition software and may include unintentional dictation errors.     Vida Rigger, M.D.  Division of Pulmonary & Critical Care Medicine

## 2023-05-13 NOTE — Anesthesia Preprocedure Evaluation (Signed)
 Anesthesia Evaluation  Patient identified by MRN, date of birth, ID band Patient awake    Reviewed: Allergy & Precautions, H&P , NPO status , Patient's Chart, lab work & pertinent test results, reviewed documented beta blocker date and time   Airway Mallampati: II  TM Distance: >3 FB Neck ROM: full    Dental  (+) Teeth Intact   Pulmonary sleep apnea , pneumonia, resolved, COPD, Current Smoker   Pulmonary exam normal        Cardiovascular Exercise Tolerance: Poor + CAD and + Peripheral Vascular Disease  Normal cardiovascular exam Rhythm:regular Rate:Normal     Neuro/Psych  Headaches, Seizures -, Well Controlled,  PSYCHIATRIC DISORDERS Anxiety Depression       GI/Hepatic Neg liver ROS,GERD  Medicated,,  Endo/Other  negative endocrine ROS    Renal/GU Renal disease  negative genitourinary   Musculoskeletal   Abdominal   Peds  Hematology negative hematology ROS (+)   Anesthesia Other Findings Past Medical History: No date: BRBPR (bright red blood per rectum) No date: CAP (community acquired pneumonia) No date: Chronic diarrhea No date: Chronic kidney disease No date: Chronic myofascial pain No date: Chronic pain No date: Chronic venous insufficiency No date: Cigarette smoker No date: COPD (chronic obstructive pulmonary disease) (HCC) No date: Coronary artery disease No date: DDD (degenerative disc disease), cervical No date: DDD (degenerative disc disease), thoracic No date: Depression No date: Elevated blood pressure reading without diagnosis of  hypertension No date: Elevated hemoglobin A1c No date: Female hirsutism No date: GERD (gastroesophageal reflux disease) No date: Headache     Comment:  migraines No date: History of adenomatous polyp of colon No date: History of kidney stones No date: History of sepsis No date: Kidney stones No date: Lymphedema No date: Mediastinal adenopathy No date: Morbid  obesity (HCC) No date: Obesity, Class III, BMI 40-49.9 (morbid obesity) (HCC) No date: OSA (obstructive sleep apnea) No date: Paranoid schizophrenia (HCC) No date: Peripheral vascular disease (HCC) No date: Primary cancer of left upper lobe of lung (HCC) No date: PTSD (post-traumatic stress disorder) No date: RLS (restless legs syndrome) No date: Seizures (HCC)     Comment:  last seizure in Jan 2025 No date: Sleep apnea No date: Supplemental oxygen dependent     Comment:  2-3 L [place] No date: Syncope Past Surgical History: No date: CERVICAL SPINE SURGERY     Comment:  Fusion C6-7 No date: CHOLECYSTECTOMY 11/18/2014: COLONOSCOPY WITH PROPOFOL; N/A     Comment:  Procedure: COLONOSCOPY WITH PROPOFOL;  Surgeon: Scot Jun, MD;  Location: [facility] ENDOSCOPY;  Service:               Endoscopy;  Laterality: N/A; No date: ESOPHAGOGASTRODUODENOSCOPY No date: KNEE SURGERY; Left No date: polpectomy; N/A No date: TUBAL LIGATION BMI    Body Mass Index: 43.58 kg/m     Reproductive/Obstetrics negative OB ROS                             Anesthesia Physical Anesthesia Plan  ASA: 3  Anesthesia Plan: General ETT   Post-op Pain Management:    Induction:   PONV Risk Score and Plan: 3  Airway Management Planned:   Additional Equipment:   Intra-op Plan:   Post-operative Plan:   Informed Consent: I have reviewed the patients History and Physical, chart, labs and discussed the procedure including the  risks, benefits and alternatives for the proposed anesthesia with the patient or authorized representative who has indicated his/her understanding and acceptance.     Dental Advisory Given  Plan Discussed with: CRNA  Anesthesia Plan Comments:        Anesthesia Quick Evaluation

## 2023-05-13 NOTE — Discharge Instructions (Signed)
 Per MD - patient will be seen for a follow up in 5 days.  May resume all medications, and advance diet as tolerated.

## 2023-05-14 LAB — BODY FLUID CELL COUNT WITH DIFFERENTIAL
Eos, Fluid: 0 %
Lymphs, Fluid: 20 %
Monocyte-Macrophage-Serous Fluid: 3 % — ABNORMAL LOW (ref 50–90)
Neutrophil Count, Fluid: 77 % — ABNORMAL HIGH (ref 0–25)
Total Nucleated Cell Count, Fluid: UNDETERMINED uL (ref 0–1000)

## 2023-05-16 ENCOUNTER — Encounter: Payer: Self-pay | Admitting: Pulmonary Disease

## 2023-05-16 LAB — ACID FAST SMEAR (AFB, MYCOBACTERIA): Acid Fast Smear: NEGATIVE

## 2023-05-16 LAB — PATHOLOGIST SMEAR REVIEW

## 2023-05-16 LAB — CULTURE, BAL-QUANTITATIVE W GRAM STAIN: Culture: NO GROWTH — AB

## 2023-05-17 NOTE — Anesthesia Postprocedure Evaluation (Signed)
 Anesthesia Post Note  Patient: Brandi Fowler  Procedure(s) Performed: BRONCHOSCOPY, FLEXIBLE BRONCHOSCOPY, WITH EBUS  Patient location during evaluation: PACU Anesthesia Type: General Level of consciousness: awake and alert Pain management: pain level controlled Vital Signs Assessment: post-procedure vital signs reviewed and stable Respiratory status: spontaneous breathing, nonlabored ventilation, respiratory function stable and patient connected to nasal cannula oxygen Cardiovascular status: blood pressure returned to baseline and stable Postop Assessment: no apparent nausea or vomiting Anesthetic complications: no   No notable events documented.   Last Vitals:  Vitals:   05/13/23 1430 05/13/23 1442  BP: 108/69 (!) 143/84  Pulse: (!) 105   Resp: (!) 21   Temp: 36.7 C (!) 36.3 C  SpO2: 92% 99%    Last Pain:  Vitals:   05/13/23 1442  TempSrc: Temporal  PainSc: 0-No pain                 Yevette Edwards

## 2023-05-19 LAB — CYTOLOGY - NON PAP

## 2023-05-24 ENCOUNTER — Inpatient Hospital Stay (HOSPITAL_BASED_OUTPATIENT_CLINIC_OR_DEPARTMENT_OTHER): Admitting: Internal Medicine

## 2023-05-24 ENCOUNTER — Encounter: Payer: Self-pay | Admitting: Internal Medicine

## 2023-05-24 ENCOUNTER — Ambulatory Visit: Admitting: Radiation Oncology

## 2023-05-24 ENCOUNTER — Encounter: Payer: Self-pay | Admitting: *Deleted

## 2023-05-24 DIAGNOSIS — F17210 Nicotine dependence, cigarettes, uncomplicated: Secondary | ICD-10-CM | POA: Insufficient documentation

## 2023-05-24 DIAGNOSIS — C3490 Malignant neoplasm of unspecified part of unspecified bronchus or lung: Secondary | ICD-10-CM

## 2023-05-24 DIAGNOSIS — C3412 Malignant neoplasm of upper lobe, left bronchus or lung: Secondary | ICD-10-CM | POA: Insufficient documentation

## 2023-05-24 DIAGNOSIS — Z9981 Dependence on supplemental oxygen: Secondary | ICD-10-CM | POA: Insufficient documentation

## 2023-05-24 MED ORDER — ONDANSETRON HCL 8 MG PO TABS: ORAL_TABLET | ORAL | 1 refills | Status: DC

## 2023-05-24 MED ORDER — PROCHLORPERAZINE MALEATE 10 MG PO TABS: 10.0000 mg | ORAL_TABLET | Freq: Four times a day (QID) | ORAL | 1 refills | Status: DC | PRN

## 2023-05-24 NOTE — Progress Notes (Signed)
 START ON PATHWAY REGIMEN - Non-Small Cell Lung     A cycle is every 7 days, concurrent with RT:     Paclitaxel      Carboplatin   **Always confirm dose/schedule in your pharmacy ordering system**  Patient Characteristics: Preoperative or Nonsurgical Candidate (Clinical Staging), Stage IIB (N2a only) or Stage III - Nonsurgical Candidate, PS = 0,1 Therapeutic Status: Preoperative or Nonsurgical Candidate (Clinical Staging) AJCC T Category: cT1c AJCC N Category: cN2b AJCC M Category: cM0 AJCC 9 Stage Grouping: IIIA Check here if patient was staged using an edition other than AJCC Staging 9th Edition: false ECOG Performance Status: 1 Intent of Therapy: Curative Intent, Discussed with Patient

## 2023-05-24 NOTE — Progress Notes (Signed)
 Moderate chest pain, took 5 different biopsies of her lung via bronchoscopy. A lot of productive cough. Drainage a lot, actually vomits up the phlegm. Oxygen at 2-3 L min. Dyspnea with any exertion. Appetite is lower. Chronic nausea for years. Diarrhea is chronic all of her life. Neuropathy in feet and l leg. Has a fatty liver and developing spider veins on her chest.

## 2023-05-24 NOTE — Assessment & Plan Note (Addendum)
#   PET scan-January/February 2025- the treated right upper lobe nodule demonstrates no significant residual update. However new enlarging mediastinal and right hilar lymph nodes demonstrated on recent CT are hypermetabolic, consistent with metastatic disease.  No evidence of distant metastatic disease. MARCH 2025- SQUAMOUS CELL CA of LN Bx- [Dr.Aleskerov]  # Long discussion with the patient regarding the goal of treatment of stage III lung cancer-goal is cure; however only 10-20% of the patients are cured.  Patient has unresectable disease.  # Discussed the role of concurrent chemoradiation [weekly carbotaxol with the daily radiation/Monday through Friday ~6 weeks].  Reviewed with the patient the potential side effects of radiation include skin rash; radiation esophagitis/pneumonitis.   # Post chemoradiation-consolidation immunotherapy with durvalumab every 2 or 4 weeks is recommended based on data from [facility] clinical trial.  # Chronic respiratory failure/COPD on 2 lits [Dr.Fleming]  # Active smoker [since age of 6-8]- tried Chantix- failed multiple interventions.  Again encouraged to quit smoking.  # Chemotherapy education/nutrition evaluation: port placement. Plan start chemotherapy along with RT. Sent the scripts for antiemetics-Zofran and Compazine;  sent to pharmacy- EMLA cream [allergy-hives] will check with pharmacy re: alternative.   # DISPOSITION: # refer to Chemo education ASAP- re: chemo- cabo-taxol # refer to IR re: port placement # refer to Nutrition/speech path re: cancer/concern for malnutrition  # follow up TBD-Dr.B  # I reviewed the blood work- with the patient and her husband, Dwayne in detail; also reviewed the imaging independently [as summarized above]; and with the patient in detail. Also discussed with [facility].

## 2023-05-24 NOTE — Progress Notes (Signed)
 [facility] CONSULT NOTE  Patient Care Team: Inc, [facility] as PCP - General Glory Buff, RN as Oncology Nurse Navigator Earna Coder, MD as Consulting Physician (Oncology)  CHIEF COMPLAINTS/PURPOSE OF CONSULTATION:   Oncology History Overview Note  # MAY 2024- RUL presumed  [poor candidate for biopsy at the time] stage I NCSLC- s/p SBRT.     # PET scan-January/February 2025- the treated right upper lobe nodule demonstrates no significant residual update. However new enlarging mediastinal and right hilar lymph nodes demonstrated on recent CT are hypermetabolic, consistent with metastatic disease.  No evidence of distant metastatic disease. MARCH 2025- SQUAMOUS CELL CA of LN Bx- [Dr.Aleskerov]  # Chronic Respiratory failure/COPD- on 2 lits home O2.     Primary cancer of left upper lobe of lung (HCC)  04/24/2023 Initial Diagnosis   Primary cancer of left upper lobe of lung (HCC)   04/24/2023 Cancer Staging   Staging form: Lung, AJCC V9 - Pathologic: Stage IIIA (pT1c, pN2b, cM0) - Signed by Earna Coder, MD on 05/24/2023   05/25/2023 -  Chemotherapy   Patient is on Treatment Plan : LUNG Carboplatin + Paclitaxel + XRT q7d     ' Lung cancer  HISTORY OF PRESENTING ILLNESS: Patient ambulating-independently.  Alone.  Brandi Fowler 58 y.o.  female pleasant patient with a history of chronic respiratory failure/COPD home O2 active smoker-  stage I presumed lung cancer in May 2024 status post SBRT-recently noted to have progressive mediastinal adenopathy in state review the results of her biopsy.  In the interim patient underwent bronchoscopy with biopsy.    Patient unfortunately continues to have chronic shortness of breath chronic cough.  Also unfortunately continues to smoke.  Patient said that she has tried multiple times to quit smoking.  Review of Systems  Constitutional:  Positive for malaise/fatigue. Negative for chills,  diaphoresis, fever and weight loss.  HENT:  Negative for nosebleeds and sore throat.   Eyes:  Negative for double vision.  Respiratory:  Positive for cough and shortness of breath. Negative for hemoptysis, sputum production and wheezing.   Cardiovascular:  Negative for chest pain, palpitations, orthopnea and leg swelling.  Gastrointestinal:  Negative for abdominal pain, blood in stool, constipation, diarrhea, heartburn, melena, nausea and vomiting.  Genitourinary:  Negative for dysuria, frequency and urgency.  Musculoskeletal:  Positive for back pain and joint pain.  Skin: Negative.  Negative for itching and rash.  Neurological:  Negative for dizziness, tingling, focal weakness, weakness and headaches.  Endo/Heme/Allergies:  Does not bruise/bleed easily.  Psychiatric/Behavioral:  Negative for depression. The patient is not nervous/anxious and does not have insomnia.     MEDICAL HISTORY:  Past Medical History:  Diagnosis Date   BRBPR (bright red blood per rectum)    CAP (community acquired pneumonia)    Chronic diarrhea    Chronic kidney disease    Chronic myofascial pain    Chronic pain    Chronic venous insufficiency    Cigarette smoker    COPD (chronic obstructive pulmonary disease) (HCC)    Coronary artery disease    DDD (degenerative disc disease), cervical    DDD (degenerative disc disease), thoracic    Depression    Elevated blood pressure reading without diagnosis of hypertension    Elevated hemoglobin A1c    Female hirsutism    GERD (gastroesophageal reflux disease)    Headache    migraines   History of adenomatous polyp of colon    History of  kidney stones    History of sepsis    Kidney stones    Lymphedema    Mediastinal adenopathy    Morbid obesity (HCC)    Obesity, Class III, BMI 40-49.9 (morbid obesity) (HCC)    OSA (obstructive sleep apnea)    Paranoid schizophrenia (HCC)    Peripheral vascular disease (HCC)    Primary cancer of left upper lobe of lung  (HCC)    PTSD (post-traumatic stress disorder)    RLS (restless legs syndrome)    Seizures (HCC)    last seizure in Jan 2025   Sleep apnea    Supplemental oxygen dependent    2-3 L    Syncope     SURGICAL HISTORY: Past Surgical History:  Procedure Laterality Date   CERVICAL SPINE SURGERY     Fusion C6-7   CHOLECYSTECTOMY     COLONOSCOPY WITH PROPOFOL N/A 11/18/2014   Procedure: COLONOSCOPY WITH PROPOFOL;  Surgeon: Scot Jun, MD;  Location: [facility] ENDOSCOPY;  Service: Endoscopy;  Laterality: N/A;   ESOPHAGOGASTRODUODENOSCOPY     FLEXIBLE BRONCHOSCOPY N/A 05/13/2023   Procedure: BRONCHOSCOPY, FLEXIBLE;  Surgeon: Vida Rigger, MD;  Location: [facility] ORS;  Service: Thoracic;  Laterality: N/A;   KNEE SURGERY Left    polpectomy N/A    TUBAL LIGATION     VIDEO BRONCHOSCOPY WITH ENDOBRONCHIAL ULTRASOUND N/A 05/13/2023   Procedure: BRONCHOSCOPY, WITH EBUS;  Surgeon: Vida Rigger, MD;  Location: [facility] ORS;  Service: Thoracic;  Laterality: N/A;    SOCIAL HISTORY: Social History   Socioeconomic History   Marital status: Single    Spouse name: Not on file   Number of children: Not on file   Years of education: Not on file   Highest education level: Not on file  Occupational History   Not on file  Tobacco Use   Smoking status: Every Day    Current packs/day: 0.25    Average packs/day: 0.3 packs/day for 45.0 years (11.3 ttl pk-yrs)    Types: Cigarettes   Smokeless tobacco: Former  Building services engineer status: Never Used  Substance and Sexual Activity   Alcohol use: Not Currently    Alcohol/week: 1.0 standard drink of alcohol    Types: 1 Standard drinks or equivalent per week   Drug use: No   Sexual activity: Yes  Other Topics Concern   Not on file  Social History Narrative   Not on file   Social Drivers of Health   Financial Resource Strain: Low Risk  (03/29/2023)   Received from [facility]   Overall Financial Resource Strain (CARDIA)     Difficulty of Paying Living Expenses: Not hard at all  Food Insecurity: No Food Insecurity (03/29/2023)   Received from [facility]   Hunger Vital Sign    Ran Out of Food in the Last Year: Never true    Worried About Running Out of Food in the Last Year: Never true  Transportation Needs: No Transportation Needs (03/29/2023)   Received from [facility]   PRAPARE - Transportation    Lack of Transportation (Non-Medical): No    In the past 12 months, has lack of transportation kept you from medical appointments or from getting medications?: No  Physical Activity: Insufficiently Active (08/31/2018)   Exercise Vital Sign    Days of Exercise per Week: 7 days    Minutes of Exercise per Session: 10 min  Stress: Stress Concern Present (08/31/2018)   [employer] of Occupational  Health - Occupational Stress Questionnaire    Feeling of Stress : Very much  Social Connections: Moderately Isolated (08/31/2018)   Social Connection and Isolation Panel [NHANES]    Frequency of Communication with Friends and Family: More than three times a week    Frequency of Social Gatherings with Friends and Family: Once a week    Attends Religious Services: Never    Database administrator or Organizations: No    Attends Banker Meetings: Never    Marital Status: Living with partner  Intimate Partner Violence: Not At Risk (05/07/2022)   Humiliation, Afraid, Rape, and Kick questionnaire    Fear of Current or Ex-Partner: No    Emotionally Abused: No    Physically Abused: No    Sexually Abused: No    FAMILY HISTORY: Family History  Problem Relation Age of Onset   Alcohol abuse Mother    Cancer Mother    COPD Mother    Hearing loss Mother    Vision loss Mother    Alcohol abuse Father    Depression Father    Early death Father    Mental illness Father     ALLERGIES:  is allergic to aripiprazole, baclofen, buprenorphine-naloxone, clonazepam, cyclobenzaprine,  doxepin, fentanyl, lurasidone, methadone, oxymorphone, penicillins, pregabalin, quetiapine, tolmetin, trazodone, acetaminophen, bupropion, colestipol, gabapentin, hydrocodone, paroxetine, sertraline, amitriptyline hcl, carbamazepine, ciprofloxacin hcl, codeine, divalproex sodium, etodolac, haloperidol, hyaluronate sodium, hydrochlorothiazide, ibuprofen, indocin [indomethacin], iodinated contrast media, ketoprofen, latuda [lurasidone hcl], lidocaine, meloxicam, metformin and related, methocarbamol, nabumetone, naproxen, opana [oxymorphone hcl], oxaprozin, perphenazine, risperidone and related, seroquel [quetiapine fumarate], silver, suboxone [buprenorphine hcl-naloxone hcl], tramadol, valproic acid, zoloft [sertraline hcl], bromfenac, buprenorphine, ciprofloxacin, clindamycin, clindamycin/lincomycin, flurbiprofen, metronidazole, and mirtazapine.  MEDICATIONS:  Current Outpatient Medications  Medication Sig Dispense Refill   albuterol (ACCUNEB) 1.25 MG/3ML nebulizer solution Take 1 ampule by nebulization every 6 (six) hours as needed for wheezing.     albuterol (VENTOLIN HFA) 108 (90 Base) MCG/ACT inhaler Inhale 2 puffs into the lungs every 4 (four) hours as needed.     naloxone (NARCAN) nasal spray 4 mg/0.1 mL CALL 911. INSTILL 1 SPRAY IN ONE NOSTRIL. MAY REPEAT Q 2 TO 3 MINUTES IF SYMPTOMS OF AN OPIOID EMERGENCY PERSISTS. ALTERNATE NOSTRILS.     ondansetron (ZOFRAN) 8 MG tablet One pill every 8 hours as needed for nausea/vomitting. 40 tablet 1   oxyCODONE (ROXICODONE) 15 MG immediate release tablet Take 15 mg by mouth in the morning, at noon, in the evening, and at bedtime.     OXYGEN Inhale 2-3 L into the lungs continuous.     predniSONE (DELTASONE) 5 MG tablet Take 1 tablet by mouth daily with breakfast.     prochlorperazine (COMPAZINE) 10 MG tablet Take 1 tablet (10 mg total) by mouth every 6 (six) hours as needed for nausea or vomiting. 40 tablet 1   roflumilast (DALIRESP) 500 MCG TABS tablet Take 1  tablet by mouth every morning.     rOPINIRole (REQUIP) 0.5 MG tablet Take 1 tablet (0.5 mg total) by mouth at bedtime. (Patient taking differently: Take 1 mg by mouth at bedtime.) 30 tablet 11   TRELEGY ELLIPTA 100-62.5-25 MCG/ACT AEPB Inhale 1 puff into the lungs every morning.     EPINEPHrine 0.3 mg/0.3 mL IJ SOAJ injection Inject 0.3 mg into the muscle as needed for anaphylaxis. (Patient not taking: Reported on 05/24/2023)     No current facility-administered medications for this visit.    PHYSICAL EXAMINATION:   Vitals:   05/24/23  1550  BP: (!) 141/82  Pulse: 94  Resp: 18  Temp: 98.1 F (36.7 C)  SpO2: 96%    Filed Weights   05/24/23 1620  Weight: 269 lb (122 kg)     Physical Exam Vitals and nursing note reviewed.  HENT:     Head: Normocephalic and atraumatic.     Mouth/Throat:     Pharynx: Oropharynx is clear.  Eyes:     Extraocular Movements: Extraocular movements intact.     Pupils: Pupils are equal, round, and reactive to light.  Cardiovascular:     Rate and Rhythm: Normal rate and regular rhythm.  Pulmonary:     Comments: Decreased breath sounds bilaterally.  Abdominal:     Palpations: Abdomen is soft.  Musculoskeletal:        General: Normal range of motion.     Cervical back: Normal range of motion.  Skin:    General: Skin is warm.  Neurological:     General: No focal deficit present.     Mental Status: She is alert and oriented to person, place, and time.  Psychiatric:        Behavior: Behavior normal.        Judgment: Judgment normal.     LABORATORY DATA:  I have reviewed the data as listed Lab Results  Component Value Date   WBC 10.5 04/29/2023   HGB 14.2 04/29/2023   HCT 42.5 04/29/2023   MCV 95.5 04/29/2023   PLT 321 04/29/2023   Recent Labs    04/06/23 1100 04/29/23 1458  NA  --  136  K  --  4.0  CL  --  101  CO2  --  24  GLUCOSE  --  104*  BUN  --  16  CREATININE 0.80 0.63  CALCIUM  --  9.3  GFRNONAA  --  >60  PROT  --   7.6  ALBUMIN  --  4.0  AST  --  18  ALT  --  21  ALKPHOS  --  81  BILITOT  --  0.4    RADIOGRAPHIC STUDIES: I have personally reviewed the radiological images as listed and agreed with the findings in the report. DG Chest Port 1 View Result Date: 05/13/2023 CLINICAL DATA:  Status post bronchoscopy. EXAM: PORTABLE CHEST 1 VIEW COMPARISON:  Radiograph 05/06/2022.  PET CT 04/22/2023 FINDINGS: No pneumothorax post bronchoscopy. The heart is upper normal in size. Chronic bronchial thickening. No pleural effusion. No confluent consolidation. IMPRESSION: 1. No pneumothorax post bronchoscopy. 2. Chronic bronchial thickening. Electronically Signed   By: Narda Rutherford M.D.   On: 05/13/2023 16:56    Primary cancer of left upper lobe of lung (HCC) # MAY 2024- RUL presumed  [poor candidate for biopsy at the time] stage I NCSLC- s/p SBRT.     # PET scan-January/February 2025- the treated right upper lobe nodule demonstrates no significant residual update. However new enlarging mediastinal and right hilar lymph nodes demonstrated on recent CT are hypermetabolic, consistent with metastatic disease.  No evidence of distant metastatic disease. MARCH 2025- SQUAMOUS CELL CA of LN Bx- [Dr.Aleskerov]  # Long discussion with the patient regarding the goal of treatment of stage III lung cancer-goal is cure; however only 10-20% of the patients are cured.  Patient has unresectable disease.  # Discussed the role of concurrent chemoradiation [weekly carbotaxol with the daily radiation/Monday through Friday ~6 weeks].  Reviewed with the patient the potential side effects of radiation include skin rash; radiation esophagitis/pneumonitis.   #  Post chemoradiation-consolidation immunotherapy with durvalumab every 2 or 4 weeks is recommended based on data from [facility] clinical trial.  # Chronic respiratory failure/COPD on 2 lits [Dr.Fleming]  # Active smoker [since age of 6-8]- tried Chantix- failed multiple  interventions.  Again encouraged to quit smoking.  # Chemotherapy education/nutrition evaluation: port placement. Plan start chemotherapy along with RT. Sent the scripts for antiemetics-Zofran and Compazine;  sent to pharmacy- EMLA cream [allergy-hives] will check with pharmacy re: alternative.   # DISPOSITION: # refer to Chemo education ASAP- re: chemo- cabo-taxol # refer to IR re: port placement # refer to Nutrition/speech path re: cancer/concern for malnutrition  # follow up TBD-Dr.B  # I reviewed the blood work- with the patient in detail; also reviewed the imaging independently [as summarized above]; and with the patient in detail. Also discussed with [facility].    Above plan of care was discussed with patient/family in detail.  My contact information was given to the patient/family.     Earna Coder, MD 05/24/2023 9:24 PM

## 2023-05-24 NOTE — Progress Notes (Signed)
 Met with patient during follow up visit with Dr. Donneta Romberg to discuss recent pathology results and treatment options. All questions answered during visit. Reviewed upcoming appts and informed that will be called with future appts once scheduled. Instructed pt to call with any questions or needs. Pt verbalized understanding.

## 2023-05-25 ENCOUNTER — Other Ambulatory Visit: Payer: Self-pay

## 2023-05-25 MED ORDER — BENZOCAINE (TOPICAL) 20 % EX OINT: TOPICAL_OINTMENT | CUTANEOUS | 2 refills | Status: DC

## 2023-05-25 NOTE — Addendum Note (Signed)
 Addended by: Glory Buff on: 05/25/2023 08:21 AM   Modules accepted: Orders

## 2023-05-25 NOTE — Addendum Note (Signed)
 Addended by: Glory Buff on: 05/25/2023 09:49 AM   Modules accepted: Orders

## 2023-05-26 ENCOUNTER — Encounter: Payer: Self-pay | Admitting: *Deleted

## 2023-05-26 ENCOUNTER — Other Ambulatory Visit: Payer: Self-pay

## 2023-05-26 NOTE — Progress Notes (Signed)
 Unable to reach patient by phone to review upcoming appts. Spoke with pt's husband who stated will ask pt to call me once she is awake to review her appts. Awaiting call back from pt.

## 2023-05-27 ENCOUNTER — Inpatient Hospital Stay

## 2023-05-27 NOTE — Progress Notes (Signed)
 [facility] Clinical Social Work  Initial Assessment   Brandi Fowler is a 58 y.o. year old female contacted by phone. Clinical Social Work was referred by medical provider, Dr. Racheal Fowler, for assessment of psychosocial needs.   SDOH (Social Determinants of Health) assessments performed: Yes SDOH Interventions    Flowsheet Row Office Visit from 09/03/2014 in [facility] Visit from 07/30/2014 in [facility]  SDOH Interventions    Depression Interventions/Treatment  Currently on Treatment, Medication Medication, Currently on Treatment       SDOH Screenings   Food Insecurity: No Food Insecurity (03/29/2023)   Received from [facility]  Housing: Low Risk  (03/29/2023)   Received from [facility]  Transportation Needs: No Transportation Needs (03/29/2023)   Received from [facility]  Utilities: Not At Risk (03/29/2023)   Received from [facility]  Financial Resource Strain: Low Risk  (03/29/2023)   Received from [facility]  Physical Activity: Insufficiently Active (08/31/2018)  Social Connections: Moderately Isolated (08/31/2018)  Stress: Stress Concern Present (08/31/2018)  Tobacco Use: High Risk (05/24/2023)  Health Literacy: Low Risk  (06/14/2020)   Received from [facility]     Distress Screen completed: No     No data to display            Family/Social Information:  Housing Arrangement: patient lives alone Family members/support persons in your life? Family.  Patient has a son and two daughters who live in [place].  One of the daughters lives nearby.  She is separated and has a significant other that assists with her care. Transportation concerns: yes  Employment: Disabled.  Patient used to work in a [employer] but was injured on the  job. Income source: Secretary/administrator concerns: Yes, due to illness and/or loss of work during treatment Type of concern: Utilities and Biomedical scientist access concerns: no Religious or spiritual practice: No Advanced directives: No Services Currently in place:  Medicare and Medicaid.  Coping/ Adjustment to diagnosis: Patient understands treatment plan and what happens next? yes Concerns about diagnosis and/or treatment: Losing my job and/or losing income Patient reported stressors: Therapist, art and/or priorities: Family Patient enjoys  spending time with her three dogs. Current coping skills/ strengths: Active sense of humor , Capable of independent living , Communication skills , General fund of knowledge , Motivation for treatment/growth , and Supportive family/friends     SUMMARY: Current SDOH Barriers:  Financial constraints related to fixed income.  Clinical Social Work Clinical Goal(s):  Explore community resource options for unmet needs related to:  Financial Strain   Interventions: Discussed common feeling and emotions when being diagnosed with cancer, and the importance of support during treatment Informed patient of the support team roles and support services at [facility] Provided CSW contact information and encouraged patient to call with any questions or concerns Provided patient with information about [employer] and will make the referral to [employer].  Also discussed the [employer] and patient would like the two free massages.  Patient to attend chemo education on 3/24 and will receive the massage packet.  CSW will also mail patient lung cancer grant information.   Follow Up Plan: CSW will follow-up with patient by phone  Patient verbalizes understanding of plan: Yes    Brandi Baseman, LCSW Clinical Social Worker [facility]  Patient is participating in a Managed Medicaid Plan:  Yes

## 2023-05-30 ENCOUNTER — Inpatient Hospital Stay

## 2023-05-30 ENCOUNTER — Ambulatory Visit
Admission: RE | Admit: 2023-05-30 | Discharge: 2023-05-30 | Disposition: A | Discharge status: Home / Self Care | Source: Ambulatory Visit | Attending: Radiation Oncology | Admitting: Radiation Oncology

## 2023-05-30 ENCOUNTER — Encounter: Payer: Self-pay | Admitting: Radiology

## 2023-05-30 ENCOUNTER — Telehealth: Payer: Self-pay

## 2023-05-30 ENCOUNTER — Encounter: Payer: Self-pay | Admitting: Radiation Oncology

## 2023-05-30 ENCOUNTER — Telehealth: Payer: Self-pay | Admitting: *Deleted

## 2023-05-30 ENCOUNTER — Other Ambulatory Visit

## 2023-05-30 ENCOUNTER — Encounter: Payer: Self-pay | Admitting: *Deleted

## 2023-05-30 VITALS — BP 120/84 | HR 81 | Temp 96.6°F | Resp 17 | Wt 272.0 lb

## 2023-05-30 DIAGNOSIS — C3412 Malignant neoplasm of upper lobe, left bronchus or lung: Secondary | ICD-10-CM

## 2023-05-30 NOTE — Telephone Encounter (Signed)
 Discussed with the patient the need to bring income information to their next appointment to initiate the approval process for the [facility].

## 2023-05-30 NOTE — Telephone Encounter (Signed)
 Please advise if pt will need NGS testing. At this time, I do not see it has been ordered yet.

## 2023-05-30 NOTE — Progress Notes (Signed)
 Radiation Oncology Follow up Note old patient new area progressive mediastinal metastatic disease  Name: Brandi Fowler   Date:   05/30/2023 MRN:  [id number] DOB: 1965/11/22    This 58 y.o. female presents to the clinic today for evaluation of progressive non-small cell lung cancer now with mediastinal positive adenopathy which is progressing.  REFERRING PROVIDER: [facility]  HPI: Patient is a 58 year old female now out 7 months having completed SBRT to right upper lobe for stage I non-small cell lung cancer..  She now has developed increasing and enlarging mediastinal and right hilar adenopathy which is on PET scan and February hypermetabolic and showing progressive disease with her chest.  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  At this time she is stage IIIa disease.  She also has a history of chronic respiratory failure is on 2 L of home oxygen continuously.  She has been seen by medical oncology with recommendation for concurrent chemoradiation.  She is seen today for radiation oncology evaluation.  Her breathing is stable she has been having a chronic cough with occasional intermittent hemoptysis.  Her p.o. intake is good her weight appears stable.  COMPLICATIONS OF TREATMENT: none  FOLLOW UP COMPLIANCE: keeps appointments   PHYSICAL EXAM:  BP 120/84   Pulse 81   Temp (!) 96.6 F (35.9 C)   Resp 17   Wt 272 lb (123.4 kg)   LMP 08/13/2014 (LMP Unknown) Comment: tubal ligation  SpO2 95%   BMI 45.26 kg/m  Well-developed slightly obese female on continuous nasal oxygen in NAD.  Well-developed well-nourished patient in NAD. HEENT reveals PERLA, EOMI, discs not visualized.  Oral cavity is clear. No oral mucosal lesions are identified. Neck is clear without evidence of cervical or supraclavicular adenopathy. Lungs are clear to A&P. Cardiac examination is essentially unremarkable with regular rate and rhythm  without murmur rub or thrill. Abdomen is benign with no organomegaly or masses noted. Motor sensory and DTR levels are equal and symmetric in the upper and lower extremities. Cranial nerves II through XII are grossly intact. Proprioception is intact. No peripheral adenopathy or edema is identified. No motor or sensory levels are noted. Crude visual fields are within normal range.  RADIOLOGY RESULTS: PET/CT and CT scans reviewed compatible with above-stated findings  PLAN: This tablet go ahead with concurrent chemoradiation therapy.  Would plan on delivering 74 Gray over 6-1/[redacted] weeks along with concurrent chemotherapy.  Risks and benefits of treatment including possible radiation esophagitis fatigue progressive cough skin reaction all were described in detail to the patient.  We will coordinate with chemotherapy her weekly chemotherapy.  There will be extra effort by both professional staff as well as technical staff to coordinate and manage concurrent chemoradiation and ensuing side effects during her treatments.  I would treat with IMRT treatment planning and delivery to spare critical structures such as her heart spinal cord normal lung volume and area of previously radiation.  Patient comprehends my recommendations well.  I personally set up and ordered CT simulation for later this week.  I would like to take this opportunity to thank you for allowing me to participate in the care of your patient.Carmina Miller, MD

## 2023-05-30 NOTE — H&P (Signed)
 Chief Complaint: RUL Non small cell lung cancer. Request is for portacath placement for chemotherapy access.   Referring Physician(s): Earna Coder  Supervising Physician: Irish Lack  Patient Status: [facility] - Out-pt  History of Present Illness: Brandi Fowler is a 58 y.o. female outpatient. Smoker. History of seizures, PVD, paranoid schizophrenia, GERD, CKD. RUL non small cell lung cancer. Patient is scheduled for chemotherapy and radiation. Team is requesting a portacath placement for chemotherapy access.  Currently without any significant complaints. Patient alert and laying in bed,calm. Denies any fevers, headache, chest pain, SOB, cough, abdominal pain, nausea, vomiting or bleeding.    No line history per EPIC. PET scan from 2.19.25. No recent labs. All medicatrions are within acceptable parameters.   Return precautions and treatment recommendations and follow-up discussed with the patient *** who is agreeable with the plan.    Past Medical History:  Diagnosis Date   BRBPR (bright red blood per rectum)    CAP (community acquired pneumonia)    Chronic diarrhea    Chronic kidney disease    Chronic myofascial pain    Chronic pain    Chronic venous insufficiency    Cigarette smoker    COPD (chronic obstructive pulmonary disease) (HCC)    Coronary artery disease    DDD (degenerative disc disease), cervical    DDD (degenerative disc disease), thoracic    Depression    Elevated blood pressure reading without diagnosis of hypertension    Elevated hemoglobin A1c    Female hirsutism    GERD (gastroesophageal reflux disease)    Headache    migraines   History of adenomatous polyp of colon    History of kidney stones    History of sepsis    Kidney stones    Lymphedema    Mediastinal adenopathy    Morbid obesity (HCC)    Obesity, Class III, BMI 40-49.9 (morbid obesity) (HCC)    OSA (obstructive sleep apnea)    Paranoid schizophrenia (HCC)    Peripheral  vascular disease (HCC)    Primary cancer of left upper lobe of lung (HCC)    PTSD (post-traumatic stress disorder)    RLS (restless legs syndrome)    Seizures (HCC)    last seizure in Jan 2025   Sleep apnea    Supplemental oxygen dependent    2-3 L [place]   Syncope     Past Surgical History:  Procedure Laterality Date   CERVICAL SPINE SURGERY     Fusion C6-7   CHOLECYSTECTOMY     COLONOSCOPY WITH PROPOFOL N/A 11/18/2014   Procedure: COLONOSCOPY WITH PROPOFOL;  Surgeon: Scot Jun, MD;  Location: [facility] ENDOSCOPY;  Service: Endoscopy;  Laterality: N/A;   ESOPHAGOGASTRODUODENOSCOPY     FLEXIBLE BRONCHOSCOPY N/A 05/13/2023   Procedure: BRONCHOSCOPY, FLEXIBLE;  Surgeon: Vida Rigger, MD;  Location: [facility] ORS;  Service: Thoracic;  Laterality: N/A;   KNEE SURGERY Left    polpectomy N/A    TUBAL LIGATION     VIDEO BRONCHOSCOPY WITH ENDOBRONCHIAL ULTRASOUND N/A 05/13/2023   Procedure: BRONCHOSCOPY, WITH EBUS;  Surgeon: Vida Rigger, MD;  Location: [facility] ORS;  Service: Thoracic;  Laterality: N/A;    Allergies: Aripiprazole, Baclofen, Buprenorphine-naloxone, Clonazepam, Cyclobenzaprine, Doxepin, Fentanyl, Lurasidone, Methadone, Oxymorphone, Penicillins, Pregabalin, Quetiapine, Tolmetin, Trazodone, Acetaminophen, Bupropion, Colestipol, Gabapentin, Hydrocodone, Paroxetine, Sertraline, Amitriptyline hcl, Carbamazepine, Ciprofloxacin hcl, Codeine, Divalproex sodium, Etodolac, Haloperidol, Hyaluronate sodium, Hydrochlorothiazide, Ibuprofen, Indocin [indomethacin], Iodinated contrast media, Ketoprofen, Latuda [lurasidone hcl], Lidocaine, Meloxicam, Metformin and related, Methocarbamol, Nabumetone, Naproxen, Opana [oxymorphone hcl],  Oxaprozin, Perphenazine, Risperidone and related, Seroquel [quetiapine fumarate], Silver, Suboxone [buprenorphine hcl-naloxone hcl], Tramadol, Valproic acid, Zoloft [sertraline hcl], Bromfenac, Buprenorphine, Ciprofloxacin, Clindamycin, Clindamycin/lincomycin, Flurbiprofen,  Metronidazole, and Mirtazapine  Medications: Prior to Admission medications   Medication Sig Start Date End Date Taking? Authorizing Provider  albuterol (ACCUNEB) 1.25 MG/3ML nebulizer solution Take 1 ampule by nebulization every 6 (six) hours as needed for wheezing.    [provider]  albuterol (VENTOLIN HFA) 108 (90 Base) MCG/ACT inhaler Inhale 2 puffs into the lungs every 4 (four) hours as needed.    [provider]  Benzocaine, Topical, 20 % OINT Apply topically to port site 1-2 hours prior to port access. Patient not taking: Reported on 05/30/2023 05/25/23   Earna Coder, MD  EPINEPHrine 0.3 mg/0.3 mL IJ SOAJ injection Inject 0.3 mg into the muscle as needed for anaphylaxis. Patient not taking: Reported on 05/30/2023    [provider]  naloxone [facility]) nasal spray 4 mg/0.1 mL CALL 911. INSTILL 1 SPRAY IN ONE NOSTRIL. MAY REPEAT Q 2 TO 3 MINUTES IF SYMPTOMS OF AN OPIOID EMERGENCY PERSISTS. ALTERNATE NOSTRILS. 07/13/16   [provider]  ondansetron (ZOFRAN) 8 MG tablet One pill every 8 hours as needed for nausea/vomitting. Patient not taking: Reported on 05/30/2023 05/24/23   Earna Coder, MD  oxyCODONE (ROXICODONE) 15 MG immediate release tablet Take 15 mg by mouth in the morning, at noon, in the evening, and at bedtime.    [provider]  OXYGEN Inhale 2-3 L into the lungs continuous.    [provider]  predniSONE (DELTASONE) 5 MG tablet Take 1 tablet by mouth daily with breakfast. 08/31/22   [provider]  prochlorperazine (COMPAZINE) 10 MG tablet Take 1 tablet (10 mg total) by mouth every 6 (six) hours as needed for nausea or vomiting. Patient not taking: Reported on 05/30/2023 05/24/23   Earna Coder, MD  roflumilast (DALIRESP) 500 MCG TABS tablet Take 1 tablet by mouth every morning.    [provider]  rOPINIRole (REQUIP) 0.5 MG tablet Take 1 tablet (0.5 mg total) by mouth at  bedtime. Patient taking differently: Take 1 mg by mouth at bedtime. 06/22/18 05/30/23  Georgiana Spinner, NP  TRELEGY ELLIPTA 100-62.5-25 MCG/ACT AEPB Inhale 1 puff into the lungs every morning.    [provider]     Family History  Problem Relation Age of Onset   Alcohol abuse Mother    Cancer Mother    COPD Mother    Hearing loss Mother    Vision loss Mother    Alcohol abuse Father    Depression Father    Early death Father    Mental illness Father     Social History   Socioeconomic History   Marital status: Single    Spouse name: Not on file   Number of children: Not on file   Years of education: Not on file   Highest education level: Not on file  Occupational History   Not on file  Tobacco Use   Smoking status: Every Day    Current packs/day: 0.25    Average packs/day: 0.3 packs/day for 45.0 years (11.3 ttl pk-yrs)    Types: Cigarettes   Smokeless tobacco: Former  Building services engineer status: Never Used  Substance and Sexual Activity   Alcohol use: Not Currently    Alcohol/week: 1.0 standard drink of alcohol    Types: 1 Standard drinks or equivalent per week   Drug use:  No   Sexual activity: Yes  Other Topics Concern   Not on file  Social History Narrative   Not on file   Social Drivers of Health   Financial Resource Strain: Low Risk  (03/29/2023)   Received [facility]   Overall Financial Resource Strain (CARDIA)    Difficulty of Paying Living Expenses: Not hard at all  Food Insecurity: No Food Insecurity (03/29/2023)   Received from [facility]   Hunger Vital Sign    Ran Out of Food in the Last Year: Never true    Worried About Running Out of Food in the Last Year: Never true  Transportation Needs: No Transportation Needs (03/29/2023)   Received from [facility]   PRAPARE - Transportation    Lack of Transportation (Non-Medical): No    In the past 12 months, has lack of transportation kept  you from medical appointments or from getting medications?: No  Physical Activity: Insufficiently Active (08/31/2018)   Exercise Vital Sign    Days of Exercise per Week: 7 days    Minutes of Exercise per Session: 10 min  Stress: Stress Concern Present (08/31/2018)   [employer] of Occupational Health - Occupational Stress Questionnaire    Feeling of Stress : Very much  Social Connections: Moderately Isolated (08/31/2018)   Social Connection and Isolation Panel [NHANES]    Frequency of Communication with Friends and Family: More than three times a week    Frequency of Social Gatherings with Friends and Family: Once a week    Attends Religious Services: Never    Database administrator or Organizations: No    Attends Banker Meetings: Never    Marital Status: Living with partner    ECOG Status: {CHL ONC ECOG BJ:[id number]}  Review of Systems: A 12 point ROS discussed and pertinent positives are indicated in the HPI above.  All other systems are negative.  Review of Systems  Vital Signs: LMP 08/13/2014 (LMP Unknown) Comment: tubal ligation    Physical Exam  Imaging: DG Chest Port 1 View Result Date: 05/13/2023 CLINICAL DATA:  Status post bronchoscopy. EXAM: PORTABLE CHEST 1 VIEW COMPARISON:  Radiograph 05/06/2022.  PET CT 04/22/2023 FINDINGS: No pneumothorax post bronchoscopy. The heart is upper normal in size. Chronic bronchial thickening. No pleural effusion. No confluent consolidation. IMPRESSION: 1. No pneumothorax post bronchoscopy. 2. Chronic bronchial thickening. Electronically Signed   By: Narda Rutherford M.D.   On: 05/13/2023 16:56    Labs:  CBC: Recent Labs    10/19/22 1410 04/29/23 1457  WBC 11.2* 10.5  HGB 13.9 14.2  HCT 42.4 42.5  PLT 238 321    COAGS: No results for input(s): "INR", "APTT" in the last 8760 hours.  BMP: Recent Labs    04/06/23 1100 04/29/23 1458  NA  --  136  K  --  4.0  CL  --  101  CO2  --  24  GLUCOSE  --  104*   BUN  --  16  CALCIUM  --  9.3  CREATININE 0.80 0.63  GFRNONAA  --  >60    LIVER FUNCTION TESTS: Recent Labs    04/29/23 1458  BILITOT 0.4  AST 18  ALT 21  ALKPHOS 81  PROT 7.6  ALBUMIN 4.0     Assessment and Plan:  58 y.o. female outpatient. Smoker. History of seizures, PVD, paranoid schizophrenia, GERD, CKD. RUL non small cell lung cancer. Patient is scheduled for chemotherapy and  radiation. Team is requesting a portacath placement for chemotherapy access.  PLAN: IR Image Guided Portacath Placement  Risks and benefits of image guided port-a-catheter placement was discussed with the patient including, but not limited to bleeding, infection, pneumothorax, or fibrin sheath development and need for additional procedures.  All of the patient's questions were answered, patient is agreeable to proceed. Consent signed and in chart.    Thank you for this interesting consult.  I greatly enjoyed meeting Brandi Fowler and look forward to participating in their care.  A copy of this report was sent to the requesting provider on this date.  Electronically Signed: Alene Mires, NP 05/30/2023, 12:56 PM   I spent a total of {New ZOXW:[id number]} {New Out-Pt:[id number]}  {Established Out-Pt:304952003} in face to face in clinical consultation, greater than 50% of which was counseling/coordinating care for ***

## 2023-05-30 NOTE — Progress Notes (Signed)
 Met with patient during initial consult with Dr. Rushie Chestnut. All questions answered during visit. Reviewed upcoming appts. Informed that will schedule her chemotherapy treatments once she has been scheduled for radiation. Nothing further needed at this time. Instructed pt to call with any questions or needs. Pt verbalized understanding.

## 2023-05-31 ENCOUNTER — Inpatient Hospital Stay

## 2023-05-31 NOTE — Telephone Encounter (Signed)
 Order placed online for xT, xR, PDL1, and xF+ liquid biopsy. Will collect blood sample on Thursday 3/27 after CT simulation appt. Will obtain financial assistance application at that time as well. Orders placed for lab draw.

## 2023-05-31 NOTE — Addendum Note (Signed)
 Addended by: Glory Buff on: 05/31/2023 08:42 AM   Modules accepted: Orders

## 2023-05-31 NOTE — Addendum Note (Signed)
 Addended by: Glory Buff on: 05/31/2023 12:36 PM   Modules accepted: Orders

## 2023-06-01 ENCOUNTER — Inpatient Hospital Stay

## 2023-06-01 ENCOUNTER — Telehealth: Payer: Self-pay | Admitting: Internal Medicine

## 2023-06-01 ENCOUNTER — Encounter: Payer: Self-pay | Admitting: *Deleted

## 2023-06-01 DIAGNOSIS — C3490 Malignant neoplasm of unspecified part of unspecified bronchus or lung: Secondary | ICD-10-CM

## 2023-06-01 MED ORDER — BENZOCAINE (TOPICAL) 20 % EX OINT: TOPICAL_OINTMENT | CUTANEOUS | 2 refills | Status: DC

## 2023-06-01 NOTE — Progress Notes (Signed)
 Pt seen in the lobby prior to consult with nutrition. Pt stated that she has an allergy to ondansetron but wasn't listed in our system. Allergy added to allergy list. Dr. B made aware. Nothing further needed at this time.

## 2023-06-01 NOTE — Progress Notes (Signed)
 Nutrition Assessment   Reason for Assessment:   New chemo start   ASSESSMENT:  58 year old female with lung cancer.  Past medical history of obesity, GERD, chronic diarrhea, smoker, CKD, CAD, COPD on oxygen.    Met with patient in clinic.  Reports that she only eat one meal a day, usually evening meal.  Says she only eats one meal a day to keep her weight under 300 lbs.  Says that she has tried multiple things to lower her weight and this is what works for her. Has a history of chronic diarrhea that patient says has been worked up with PCP and GI.  Does not like to eat out away from home due to diarrhea episodes.  Takes imodium to help control it.  Says that she is a picky eater.  Likes fried chicken, and other meats, no fruits, green beans, white beans, pinto beans and few other dried beans, salad, corn, potato, cheese but no other foods.  Drinks a Mt Dew in the AM and PM (12 oz) and water during the day.  Likes candy.     Medications: compazine, predinsone   Labs: reviewed   Anthropometrics:   Height: 65 inches Weight: 277 lb 5 oz 283 lb on 10/19/22 BMI: 46    NUTRITION DIAGNOSIS: Food and nutrition related knowledge deficit related to cancer diagnosis starting concurrent chemotherapy and radiation as evidenced by side effects from treatment may impact nutrition   INTERVENTION:  Discussed importance of nutrition during treatment.   Encouraged lean sources of protein foods at meal times Encouraged incorporating vegetables that she likes in meals Encouraged sugar free beverages to stay hydrated during treatment Contact information provided    MONITORING, EVALUATION, GOAL: weight trends, intake   Next Visit: to be determined with treatment  Myrl Lazarus B. Elease Hashimoto, CSO, LDN Registered Dietitian [phone]

## 2023-06-01 NOTE — Telephone Encounter (Signed)
 I spoke to patient's PCP. Dr.Garner-phone number [phone]-regarding patient diagnosis treatment plan-overall poor prognosis-especially given her extensive comorbidities.   Please schedule with Gwendlyn Deutscher next subsequent visits-regarding palliative care.  FYI-GB

## 2023-06-02 ENCOUNTER — Inpatient Hospital Stay

## 2023-06-02 ENCOUNTER — Other Ambulatory Visit: Payer: Self-pay | Admitting: Radiology

## 2023-06-02 ENCOUNTER — Other Ambulatory Visit: Payer: Self-pay | Admitting: Internal Medicine

## 2023-06-02 ENCOUNTER — Encounter: Payer: Self-pay | Admitting: *Deleted

## 2023-06-02 ENCOUNTER — Ambulatory Visit
Admission: RE | Admit: 2023-06-02 | Discharge: 2023-06-02 | Discharge status: Home / Self Care | Source: Ambulatory Visit | Attending: Radiation Oncology | Admitting: Radiation Oncology

## 2023-06-02 ENCOUNTER — Other Ambulatory Visit: Payer: Self-pay

## 2023-06-02 DIAGNOSIS — C3412 Malignant neoplasm of upper lobe, left bronchus or lung: Secondary | ICD-10-CM | POA: Diagnosis not present

## 2023-06-02 LAB — MISCELLANEOUS TEST

## 2023-06-02 LAB — CBC WITH DIFFERENTIAL (CANCER CENTER ONLY)
Abs Immature Granulocytes: 0.03 10*3/uL (ref 0.00–0.07)
Basophils Absolute: 0.1 10*3/uL (ref 0.0–0.1)
Basophils Relative: 1 %
Eosinophils Absolute: 0.3 10*3/uL (ref 0.0–0.5)
Eosinophils Relative: 3 %
HCT: 39 % (ref 36.0–46.0)
Hemoglobin: 12.8 g/dL (ref 12.0–15.0)
Immature Granulocytes: 0 %
Lymphocytes Relative: 15 %
Lymphs Abs: 1.3 10*3/uL (ref 0.7–4.0)
MCH: 31.5 pg (ref 26.0–34.0)
MCHC: 32.8 g/dL (ref 30.0–36.0)
MCV: 96.1 fL (ref 80.0–100.0)
Monocytes Absolute: 0.6 10*3/uL (ref 0.1–1.0)
Monocytes Relative: 7 %
Neutro Abs: 6.4 10*3/uL (ref 1.7–7.7)
Neutrophils Relative %: 74 %
Platelet Count: 263 10*3/uL (ref 150–400)
RBC: 4.06 MIL/uL (ref 3.87–5.11)
RDW: 13.5 % (ref 11.5–15.5)
WBC Count: 8.7 10*3/uL (ref 4.0–10.5)
nRBC: 0 % (ref 0.0–0.2)

## 2023-06-02 LAB — CMP (CANCER CENTER ONLY)
ALT: 24 U/L (ref 0–44)
AST: 20 U/L (ref 15–41)
Albumin: 3.5 g/dL (ref 3.5–5.0)
Alkaline Phosphatase: 83 U/L (ref 38–126)
Anion gap: 13 (ref 5–15)
BUN: 13 mg/dL (ref 6–20)
CO2: 25 mmol/L (ref 22–32)
Calcium: 9 mg/dL (ref 8.9–10.3)
Chloride: 100 mmol/L (ref 98–111)
Creatinine: 0.84 mg/dL (ref 0.44–1.00)
GFR, Estimated: >60 mL/min (ref >60)
Glucose, Bld: 94 mg/dL (ref 70–99)
Potassium: 4.1 mmol/L (ref 3.5–5.1)
Sodium: 138 mmol/L (ref 135–145)
Total Bilirubin: 0.4 mg/dL (ref 0.0–1.2)
Total Protein: 7.1 g/dL (ref 6.5–8.1)

## 2023-06-02 NOTE — Progress Notes (Signed)
 Met with patient after CT simulation appt today. All questions answered during visit. Reviewed upcoming appts including port placement and chemotherapy appts. Pt given copy of pre-procedure instructions for port placement and informed to arrive at 10am at the [facility] V[facility] entrance. Pt informed that further NGS testing will be ordered on her recent biopsy sample and blood sample that will be collected today. Financial assistance application completed. Pt escorted to the lab and blood collection kit given to lab staff to collect. Instructed pt to call with any questions or needs. Pt verbalized understanding.

## 2023-06-02 NOTE — Addendum Note (Signed)
 Addended by: Glory Buff on: 06/02/2023 08:18 AM   Modules accepted: Orders

## 2023-06-02 NOTE — Progress Notes (Signed)
 Hayley- please scheule- cbc/cmp when she comes for tempus today- I cannot sign chemo orders.  BC- please schedule-  4/10-4/17; 4/24 MD; labs- cbc/cmp; carbo-taxol- GB

## 2023-06-02 NOTE — Addendum Note (Signed)
 Addended by: Glory Buff on: 06/02/2023 08:44 AM   Modules accepted: Orders

## 2023-06-02 NOTE — Progress Notes (Signed)
 Patient for IR Port Insertion on Friday 06/03/23, I called and spoke with the patient on the phone and gave pre-procedure instructions. Pt was made aware to be here at 10a, NPO after MN prior to procedure as well as driver post procedure/recovery/discharge. Pt stated understanding.  Called 06/02/23

## 2023-06-03 ENCOUNTER — Encounter: Payer: Self-pay | Admitting: Radiology

## 2023-06-03 ENCOUNTER — Other Ambulatory Visit: Payer: Self-pay

## 2023-06-03 ENCOUNTER — Ambulatory Visit
Admission: RE | Admit: 2023-06-03 | Discharge: 2023-06-03 | Disposition: A | Discharge status: Home / Self Care | Source: Ambulatory Visit | Attending: Internal Medicine | Admitting: Internal Medicine

## 2023-06-03 DIAGNOSIS — F17210 Nicotine dependence, cigarettes, uncomplicated: Secondary | ICD-10-CM | POA: Insufficient documentation

## 2023-06-03 DIAGNOSIS — C3490 Malignant neoplasm of unspecified part of unspecified bronchus or lung: Secondary | ICD-10-CM | POA: Insufficient documentation

## 2023-06-03 HISTORY — PX: IR IMAGING GUIDED PORT INSERTION: IMG5740

## 2023-06-03 MED ORDER — MIDAZOLAM HCL 2 MG/2ML IJ SOLN
INTRAMUSCULAR | Status: AC
  Filled 2023-06-03: qty 2

## 2023-06-03 MED ORDER — HEPARIN SOD (PORK) LOCK FLUSH 100 UNIT/ML IV SOLN
500.0000 [IU] | Freq: Once | INTRAVENOUS | Status: AC
  Administered 2023-06-03: 500 [IU] via INTRAVENOUS

## 2023-06-03 MED ORDER — CHLOROPROCAINE HCL (PF) 3 % IJ SOLN
20.0000 mL | Freq: Once | INTRAMUSCULAR | Status: AC
  Administered 2023-06-03: 600 mg

## 2023-06-03 MED ORDER — HEPARIN SOD (PORK) LOCK FLUSH 100 UNIT/ML IV SOLN
INTRAVENOUS | Status: AC
  Filled 2023-06-03: qty 5

## 2023-06-03 MED ORDER — CHLOROPROCAINE HCL (PF) 3 % IJ SOLN
INTRAMUSCULAR | Status: AC
  Filled 2023-06-03: qty 20

## 2023-06-03 MED ORDER — DIPHENHYDRAMINE HCL 50 MG/ML IJ SOLN
INTRAMUSCULAR | Status: AC | PRN
  Administered 2023-06-03: 25 mg via INTRAVENOUS

## 2023-06-03 MED ORDER — DIPHENHYDRAMINE HCL 50 MG/ML IJ SOLN
INTRAMUSCULAR | Status: AC
Start: 2023-06-03 — End: ?
  Filled 2023-06-03: qty 1

## 2023-06-03 MED ORDER — MIDAZOLAM HCL 5 MG/5ML IJ SOLN
INTRAMUSCULAR | Status: AC | PRN
  Administered 2023-06-03: 1 mg via INTRAVENOUS

## 2023-06-03 MED ORDER — MIDAZOLAM HCL 2 MG/2ML IJ SOLN
INTRAMUSCULAR | Status: AC | PRN
  Administered 2023-06-03: 1 mg via INTRAVENOUS

## 2023-06-03 MED ORDER — SODIUM CHLORIDE 0.9 % IV SOLN: INTRAVENOUS | Status: DC

## 2023-06-03 NOTE — Procedures (Signed)
 Interventional Radiology Procedure Note  Procedure: Single Lumen Power Port Placement    Access:  Right IJ vein.  Findings: Catheter tip positioned at SVC/RA junction. Port is ready for immediate use.   Complications: None  EBL: < 10 mL  Recommendations:  - Ok to shower in 24 hours - Do not submerge for 7 days - Routine line care   Maxi Carreras T. Fredia Sorrow, M.D Pager:  [phone]

## 2023-06-06 DIAGNOSIS — C3412 Malignant neoplasm of upper lobe, left bronchus or lung: Secondary | ICD-10-CM | POA: Diagnosis not present

## 2023-06-09 ENCOUNTER — Encounter: Payer: Self-pay | Admitting: Internal Medicine

## 2023-06-09 NOTE — Progress Notes (Signed)
 Pharmacist Chemotherapy Monitoring - Initial Assessment    Anticipated start date: 06/17/23   The following has been reviewed per standard work regarding the patient's treatment regimen: The patient's diagnosis, treatment plan and drug doses, and organ/hematologic function Lab orders and baseline tests specific to treatment regimen  The treatment plan start date, drug sequencing, and pre-medications Prior authorization status  Patient's documented medication list, including drug-drug interaction screen and prescriptions for anti-emetics and supportive care specific to the treatment regimen The drug concentrations, fluid compatibility, administration routes, and timing of the medications to be used The patient's access for treatment and lifetime cumulative dose history, if applicable  The patient's medication allergies and previous infusion related reactions, if applicable   Changes made to treatment plan:  N/A  Follow up needed:  prescriptions needed for anti-emetics and patient's allergy to Zofran : update: rxn is blood in urine   Sharen Hones, RPH, 06/09/2023  4:08 PM

## 2023-06-13 ENCOUNTER — Telehealth: Payer: Self-pay | Admitting: *Deleted

## 2023-06-13 ENCOUNTER — Ambulatory Visit
Admission: RE | Admit: 2023-06-13 | Discharge: 2023-06-13 | Disposition: A | Discharge status: Home / Self Care | Source: Ambulatory Visit | Attending: Radiation Oncology | Admitting: Radiation Oncology

## 2023-06-13 ENCOUNTER — Encounter: Admitting: Hospice and Palliative Medicine

## 2023-06-13 DIAGNOSIS — Z510 Encounter for antineoplastic radiation therapy: Secondary | ICD-10-CM | POA: Insufficient documentation

## 2023-06-13 DIAGNOSIS — L259 Unspecified contact dermatitis, unspecified cause: Secondary | ICD-10-CM | POA: Insufficient documentation

## 2023-06-13 DIAGNOSIS — Z5111 Encounter for antineoplastic chemotherapy: Secondary | ICD-10-CM | POA: Insufficient documentation

## 2023-06-13 DIAGNOSIS — C3412 Malignant neoplasm of upper lobe, left bronchus or lung: Secondary | ICD-10-CM | POA: Insufficient documentation

## 2023-06-13 NOTE — Telephone Encounter (Signed)
 [facility]  xT and xR on Tissue rejected due to insufficient tumor. Testing canceled.

## 2023-06-14 ENCOUNTER — Other Ambulatory Visit: Payer: Self-pay

## 2023-06-14 ENCOUNTER — Ambulatory Visit
Admission: RE | Admit: 2023-06-14 | Discharge: 2023-06-14 | Disposition: A | Discharge status: Home / Self Care | Source: Ambulatory Visit | Attending: Radiation Oncology | Admitting: Radiation Oncology

## 2023-06-14 DIAGNOSIS — L259 Unspecified contact dermatitis, unspecified cause: Secondary | ICD-10-CM | POA: Diagnosis not present

## 2023-06-14 DIAGNOSIS — C3412 Malignant neoplasm of upper lobe, left bronchus or lung: Secondary | ICD-10-CM | POA: Diagnosis present

## 2023-06-14 DIAGNOSIS — Z5111 Encounter for antineoplastic chemotherapy: Secondary | ICD-10-CM | POA: Diagnosis not present

## 2023-06-14 DIAGNOSIS — Z510 Encounter for antineoplastic radiation therapy: Secondary | ICD-10-CM | POA: Diagnosis not present

## 2023-06-14 LAB — RAD ONC ARIA SESSION SUMMARY
Course Elapsed Days: 0
Plan Fractions Treated to Date: 1
Plan Prescribed Dose Per Fraction: 2 Gy
Plan Total Fractions Prescribed: 33
Plan Total Prescribed Dose: 66 Gy
Reference Point Dosage Given to Date: 2 Gy
Reference Point Session Dosage Given: 2 Gy
Session Number: 1

## 2023-06-15 ENCOUNTER — Encounter: Payer: Self-pay | Admitting: Internal Medicine

## 2023-06-15 ENCOUNTER — Ambulatory Visit
Admission: RE | Admit: 2023-06-15 | Discharge: 2023-06-15 | Disposition: A | Discharge status: Home / Self Care | Source: Ambulatory Visit | Attending: Radiation Oncology | Admitting: Radiation Oncology

## 2023-06-15 ENCOUNTER — Other Ambulatory Visit: Payer: Self-pay

## 2023-06-15 DIAGNOSIS — Z5111 Encounter for antineoplastic chemotherapy: Secondary | ICD-10-CM | POA: Diagnosis not present

## 2023-06-15 LAB — RAD ONC ARIA SESSION SUMMARY
Course Elapsed Days: 1
Plan Fractions Treated to Date: 2
Plan Prescribed Dose Per Fraction: 2 Gy
Plan Total Fractions Prescribed: 33
Plan Total Prescribed Dose: 66 Gy
Reference Point Dosage Given to Date: 4 Gy
Reference Point Session Dosage Given: 2 Gy
Session Number: 2

## 2023-06-16 ENCOUNTER — Ambulatory Visit
Admission: RE | Admit: 2023-06-16 | Discharge: 2023-06-16 | Disposition: A | Discharge status: Home / Self Care | Source: Ambulatory Visit | Attending: Radiation Oncology | Admitting: Radiation Oncology

## 2023-06-16 ENCOUNTER — Other Ambulatory Visit: Payer: Self-pay

## 2023-06-16 DIAGNOSIS — Z5111 Encounter for antineoplastic chemotherapy: Secondary | ICD-10-CM | POA: Diagnosis not present

## 2023-06-16 LAB — RAD ONC ARIA SESSION SUMMARY
Course Elapsed Days: 2
Plan Fractions Treated to Date: 3
Plan Prescribed Dose Per Fraction: 2 Gy
Plan Total Fractions Prescribed: 33
Plan Total Prescribed Dose: 66 Gy
Reference Point Dosage Given to Date: 6 Gy
Reference Point Session Dosage Given: 2 Gy
Session Number: 3

## 2023-06-17 ENCOUNTER — Inpatient Hospital Stay

## 2023-06-17 ENCOUNTER — Other Ambulatory Visit: Payer: Self-pay

## 2023-06-17 ENCOUNTER — Telehealth: Payer: Self-pay

## 2023-06-17 ENCOUNTER — Encounter: Payer: Self-pay | Admitting: *Deleted

## 2023-06-17 ENCOUNTER — Inpatient Hospital Stay (HOSPITAL_BASED_OUTPATIENT_CLINIC_OR_DEPARTMENT_OTHER): Admitting: Hospice and Palliative Medicine

## 2023-06-17 ENCOUNTER — Encounter: Payer: Self-pay | Admitting: Internal Medicine

## 2023-06-17 ENCOUNTER — Inpatient Hospital Stay (HOSPITAL_BASED_OUTPATIENT_CLINIC_OR_DEPARTMENT_OTHER): Admitting: Internal Medicine

## 2023-06-17 ENCOUNTER — Ambulatory Visit
Admission: RE | Admit: 2023-06-17 | Discharge: 2023-06-17 | Disposition: A | Discharge status: Home / Self Care | Source: Ambulatory Visit | Attending: Radiation Oncology | Admitting: Radiation Oncology

## 2023-06-17 VITALS — BP 134/89 | HR 85 | Temp 97.1°F | Resp 18

## 2023-06-17 VITALS — BP 98/65 | Wt 271.6 lb

## 2023-06-17 DIAGNOSIS — Z79633 Long term (current) use of mitotic inhibitor: Secondary | ICD-10-CM | POA: Insufficient documentation

## 2023-06-17 DIAGNOSIS — J449 Chronic obstructive pulmonary disease, unspecified: Secondary | ICD-10-CM | POA: Insufficient documentation

## 2023-06-17 DIAGNOSIS — C3412 Malignant neoplasm of upper lobe, left bronchus or lung: Secondary | ICD-10-CM

## 2023-06-17 DIAGNOSIS — Z5111 Encounter for antineoplastic chemotherapy: Secondary | ICD-10-CM | POA: Insufficient documentation

## 2023-06-17 DIAGNOSIS — Z9981 Dependence on supplemental oxygen: Secondary | ICD-10-CM | POA: Insufficient documentation

## 2023-06-17 DIAGNOSIS — G629 Polyneuropathy, unspecified: Secondary | ICD-10-CM | POA: Insufficient documentation

## 2023-06-17 DIAGNOSIS — Z515 Encounter for palliative care: Secondary | ICD-10-CM | POA: Diagnosis not present

## 2023-06-17 DIAGNOSIS — F17210 Nicotine dependence, cigarettes, uncomplicated: Secondary | ICD-10-CM | POA: Insufficient documentation

## 2023-06-17 DIAGNOSIS — Z79630 Long term (current) use of alkylating agent: Secondary | ICD-10-CM | POA: Insufficient documentation

## 2023-06-17 LAB — CBC WITH DIFFERENTIAL (CANCER CENTER ONLY)
Abs Immature Granulocytes: 0.13 10*3/uL — ABNORMAL HIGH (ref 0.00–0.07)
Basophils Absolute: 0.1 10*3/uL (ref 0.0–0.1)
Basophils Relative: 1 %
Eosinophils Absolute: 0.3 10*3/uL (ref 0.0–0.5)
Eosinophils Relative: 2 %
HCT: 40.9 % (ref 36.0–46.0)
Hemoglobin: 13.5 g/dL (ref 12.0–15.0)
Immature Granulocytes: 1 %
Lymphocytes Relative: 15 %
Lymphs Abs: 2 10*3/uL (ref 0.7–4.0)
MCH: 31.5 pg (ref 26.0–34.0)
MCHC: 33 g/dL (ref 30.0–36.0)
MCV: 95.6 fL (ref 80.0–100.0)
Monocytes Absolute: 0.9 10*3/uL (ref 0.1–1.0)
Monocytes Relative: 6 %
Neutro Abs: 10.3 10*3/uL — ABNORMAL HIGH (ref 1.7–7.7)
Neutrophils Relative %: 75 %
Platelet Count: 342 10*3/uL (ref 150–400)
RBC: 4.28 MIL/uL (ref 3.87–5.11)
RDW: 13.4 % (ref 11.5–15.5)
WBC Count: 13.8 10*3/uL — ABNORMAL HIGH (ref 4.0–10.5)
nRBC: 0 % (ref 0.0–0.2)

## 2023-06-17 LAB — CMP (CANCER CENTER ONLY)
ALT: 20 U/L (ref 0–44)
AST: 21 U/L (ref 15–41)
Albumin: 3.8 g/dL (ref 3.5–5.0)
Alkaline Phosphatase: 90 U/L (ref 38–126)
Anion gap: 10 (ref 5–15)
BUN: 16 mg/dL (ref 6–20)
CO2: 23 mmol/L (ref 22–32)
Calcium: 9.2 mg/dL (ref 8.9–10.3)
Chloride: 103 mmol/L (ref 98–111)
Creatinine: 0.8 mg/dL (ref 0.44–1.00)
GFR, Estimated: >60 mL/min (ref >60)
Glucose, Bld: 117 mg/dL — ABNORMAL HIGH (ref 70–99)
Potassium: 3.7 mmol/L (ref 3.5–5.1)
Sodium: 136 mmol/L (ref 135–145)
Total Bilirubin: 0.3 mg/dL (ref 0.0–1.2)
Total Protein: 7.6 g/dL (ref 6.5–8.1)

## 2023-06-17 LAB — RAD ONC ARIA SESSION SUMMARY
Course Elapsed Days: 3
Plan Fractions Treated to Date: 4
Plan Prescribed Dose Per Fraction: 2 Gy
Plan Total Fractions Prescribed: 33
Plan Total Prescribed Dose: 66 Gy
Reference Point Dosage Given to Date: 8 Gy
Reference Point Session Dosage Given: 2 Gy
Session Number: 4

## 2023-06-17 MED ORDER — DIPHENHYDRAMINE HCL 50 MG/ML IJ SOLN
50.0000 mg | Freq: Once | INTRAMUSCULAR | Status: AC
  Administered 2023-06-17: 50 mg via INTRAVENOUS
  Filled 2023-06-17: qty 1

## 2023-06-17 MED ORDER — PALONOSETRON HCL INJECTION 0.25 MG/5ML
0.2500 mg | Freq: Once | INTRAVENOUS | Status: AC
  Administered 2023-06-17: 0.25 mg via INTRAVENOUS
  Filled 2023-06-17: qty 5

## 2023-06-17 MED ORDER — HEPARIN SOD (PORK) LOCK FLUSH 100 UNIT/ML IV SOLN
500.0000 [IU] | Freq: Once | INTRAVENOUS | Status: AC | PRN
Start: 2023-06-17 — End: 2023-06-17
  Administered 2023-06-17: 500 [IU]
  Filled 2023-06-17: qty 5

## 2023-06-17 MED ORDER — SODIUM CHLORIDE 0.9 % IV SOLN
INTRAVENOUS | Status: DC
Start: 2023-06-17 — End: 2023-06-17
  Filled 2023-06-17: qty 250

## 2023-06-17 MED ORDER — DEXAMETHASONE SODIUM PHOSPHATE 10 MG/ML IJ SOLN
10.0000 mg | Freq: Once | INTRAMUSCULAR | Status: AC
  Administered 2023-06-17: 10 mg via INTRAVENOUS
  Filled 2023-06-17: qty 1

## 2023-06-17 MED ORDER — SODIUM CHLORIDE 0.9% FLUSH
10.0000 mL | INTRAVENOUS | Status: DC | PRN
Start: 2023-06-17 — End: 2023-06-17
  Administered 2023-06-17: 10 mL
  Filled 2023-06-17: qty 10

## 2023-06-17 MED ORDER — SODIUM CHLORIDE 0.9 % IV SOLN
45.0000 mg/m2 | Freq: Once | INTRAVENOUS | Status: AC
  Administered 2023-06-17: 102 mg via INTRAVENOUS
  Filled 2023-06-17: qty 17

## 2023-06-17 MED ORDER — SODIUM CHLORIDE 0.9 % IV SOLN
300.0000 mg | Freq: Once | INTRAVENOUS | Status: AC
  Administered 2023-06-17: 300 mg via INTRAVENOUS
  Filled 2023-06-17: qty 30

## 2023-06-17 MED ORDER — FAMOTIDINE IN NACL 20-0.9 MG/50ML-% IV SOLN
20.0000 mg | Freq: Once | INTRAVENOUS | Status: AC
  Administered 2023-06-17: 20 mg via INTRAVENOUS
  Filled 2023-06-17: qty 50

## 2023-06-17 NOTE — Progress Notes (Signed)
 Palliative Medicine [facility] at [facility] [phone] [phone]   Name: Brandi Fowler Date: 06/17/2023 MRN: [id number]  DOB: 12/26/1965  Patient Care Team: [facility], [facility] as PCP - General Lemar Lofty, [place], RN as Oncology Nurse Navigator Earna Coder, MD as Consulting Physician (Oncology)    REASON FOR CONSULTATION: Brandi Fowler is a 58 y.o. female with multiple medical problems including O2 dependent COPD, tobacco use, at least stage III squamous cell lung.  Plan is for concurrent chemoradiation and then consolidation immunotherapy.  Patient referred to palliative care to address goals.  SOCIAL HISTORY:     reports that she has been smoking cigarettes. She has a 11.3 pack-year smoking history. She has quit using smokeless tobacco. She reports that she does not currently use alcohol after a past usage of about 1.0 standard drink of alcohol per week. She reports that she does not use drugs.  Patient is not married and lives at home alone.  However, she has a "fianc"/significant other whom she has been with for decades.  Patient also has a son and 2 daughters.  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  ADVANCE DIRECTIVES:  Does not have  CODE STATUS:   PAST MEDICAL HISTORY: Past Medical History:  Diagnosis Date   BRBPR (bright red blood per rectum)    CAP (community acquired pneumonia)    Chronic diarrhea    Chronic kidney disease    Chronic myofascial pain    Chronic pain    Chronic venous insufficiency    Cigarette smoker    COPD (chronic obstructive pulmonary disease) (HCC)    Coronary artery disease    DDD (degenerative disc disease), cervical    DDD (degenerative disc disease), thoracic    Depression    Elevated blood pressure reading without diagnosis of hypertension    Elevated hemoglobin A1c    Female hirsutism    GERD  (gastroesophageal reflux disease)    Headache    migraines   History of adenomatous polyp of colon    History of kidney stones    History of sepsis    Kidney stones    Lymphedema    Mediastinal adenopathy    Morbid obesity (HCC)    Obesity, Class III, BMI 40-49.9 (morbid obesity) (HCC)    OSA (obstructive sleep apnea)    Paranoid schizophrenia (HCC)    Peripheral vascular disease (HCC)    Primary cancer of left upper lobe of lung (HCC)    PTSD (post-traumatic stress disorder)    RLS (restless legs syndrome)    Seizures (HCC)    last seizure in Jan 2025   Sleep apnea    Supplemental oxygen dependent    2-3 L [place]   Syncope     PAST SURGICAL HISTORY:  Past Surgical History:  Procedure Laterality Date   CERVICAL SPINE SURGERY     Fusion C6-7   CHOLECYSTECTOMY     COLONOSCOPY WITH PROPOFOL N/A 11/18/2014   Procedure: COLONOSCOPY WITH PROPOFOL;  Surgeon: Scot Jun, MD;  Location: [facility] END[facility]COPY;  Service: Endoscopy;  Laterality: N/A;   ESOPHAGOGASTRODUODENOSCOPY     FLEXIBLE BRONCHOSCOPY N/A 05/13/2023   Procedure: BRONCHOSCOPY, FLEXIBLE;  Surgeon: Vida Rigger, MD;  Location: [facility] ORS;  Service: Thoracic;  Laterality: N/A;   IR IMAGING GUIDED PORT INSERTION  06/03/2023   KNEE SURGERY Left    polpectomy N/A    TUBAL LIGATION  VIDEO BRONCHOSCOPY WITH ENDOBRONCHIAL ULTRASOUND N/A 05/13/2023   Procedure: BRONCHOSCOPY, WITH EBUS;  Surgeon: Vida Rigger, MD;  Location: [facility] ORS;  Service: Thoracic;  Laterality: N/A;    HEMATOLOGY/ONCOLOGY HISTORY:  Oncology History Overview Note  # MAY 2024- RUL presumed  [poor candidate for biopsy at the time] stage I NCSLC- s/p SBRT.     # PET scan-January/February 2025- the treated right upper lobe nodule demonstrates no significant residual update. However new enlarging mediastinal and right hilar lymph nodes demonstrated on recent CT are hypermetabolic, consistent with metastatic disease.  No evidence of distant metastatic disease.  MARCH 2025- SQUAMOUS CELL CA of LN Bx- [Dr.Aleskerov]  # April 11th, 2025- Carbo-Taxol- RT   # Chronic Respiratory failure/COPD- on 2 lits home O2.     Primary cancer of left upper lobe of lung (HCC)  04/24/2023 Initial Diagnosis   Primary cancer of left upper lobe of lung (HCC)   04/24/2023 Cancer Staging   Staging form: Lung, AJCC V9 - Pathologic: Stage IIIA (pT1c, pN2b, cM0) - Signed by Earna Coder, MD on 05/24/2023   06/16/2023 -  Chemotherapy   Patient is on Treatment Plan : LUNG Carboplatin + Paclitaxel + XRT q7d       ALLERGIES:  is allergic to aripiprazole, baclofen, buprenorphine-naloxone, clonazepam, cyclobenzaprine, doxepin, fentanyl, lurasidone, methadone, oxymorphone, penicillins, pregabalin, quetiapine, tolmetin, trazodone, acetaminophen, bupropion, colestipol, gabapentin, hydrocodone, paroxetine, sertraline, amitriptyline hcl, carbamazepine, ciprofloxacin hcl, codeine, divalproex sodium, etodolac, haloperidol, hyaluronate sodium, hydrochlorothiazide, ibuprofen, indocin [indomethacin], iodinated contrast media, ketoprofen, latuda [lurasidone hcl], lidocaine, meloxicam, metformin and related, methocarbamol, nabumetone, naproxen, ondansetron, opana [oxymorphone hcl], oxaprozin, perphenazine, risperidone and related, seroquel [quetiapine fumarate], silver, suboxone [buprenorphine hcl-naloxone hcl], tramadol, valproic acid, zoloft [sertraline hcl], bromfenac, buprenorphine, ciprofloxacin, clindamycin, clindamycin/lincomycin, flurbiprofen, metronidazole, and mirtazapine.  MEDICATIONS:  Current Outpatient Medications  Medication Sig Dispense Refill   albuterol (ACCUNEB) 1.25 MG/3ML nebulizer solution Take 1 ampule by nebulization every 6 (six) hours as needed for wheezing.     albuterol (VENTOLIN HFA) 108 (90 Base) MCG/ACT inhaler Inhale 2 puffs into the lungs every 4 (four) hours as needed.     Benzocaine, Topical, 20 % OINT Apply topically to port site 1-2 hours prior to  port access. 28 g 2   EPINEPHrine 0.3 mg/0.3 mL IJ SOAJ injection Inject 0.3 mg into the muscle as needed for anaphylaxis. (Patient not taking: Reported on 05/24/2023)     naloxone (NARCAN) nasal spray 4 mg/0.1 mL CALL 911. INSTILL 1 SPRAY IN ONE NOSTRIL. MAY REPEAT Q 2 TO 3 MINUTES IF SYMPTOMS OF AN OPIOID EMERGENCY PERSISTS. ALTERNATE NOSTRILS.     oxyCODONE (ROXICODONE) 15 MG immediate release tablet Take 15 mg by mouth in the morning, at noon, in the evening, and at bedtime.     OXYGEN Inhale 2-3 L into the lungs continuous.     predniSONE (DELTASONE) 5 MG tablet Take 1 tablet by mouth daily with breakfast.     prochlorperazine (COMPAZINE) 10 MG tablet Take 1 tablet (10 mg total) by mouth every 6 (six) hours as needed for nausea or vomiting. 40 tablet 1   roflumilast (DALIRESP) 500 MCG TABS tablet Take 1 tablet by mouth every morning.     rOPINIRole (REQUIP) 0.5 MG tablet Take 1 tablet (0.5 mg total) by mouth at bedtime. (Patient taking differently: Take 1 mg by mouth at bedtime.) 30 tablet 11   sulfamethoxazole-trimethoprim (BACTRIM) 400-80 MG tablet Take 1 tablet by mouth 3 (three) times a week.     TRELEGY ELLIPTA 100-62.5-25  MCG/ACT AEPB Inhale 1 puff into the lungs every morning.     No current facility-administered medications for this visit.    VITAL SIGNS: LMP 08/13/2014 (LMP Unknown) Comment: tubal ligation There were no vitals filed for this visit.  Estimated body mass index is 45.2 kg/m as calculated from the following:   Height as of 06/03/23: 5\' 5"  (1.651 m).   Weight as of an earlier encounter on 06/17/23: 271 lb 9.6 oz (123.2 kg).  LABS: CBC:    Component Value Date/Time   WBC 13.8 (H) 06/17/2023 0858   WBC 18.2 (H) 05/08/2022 0440   HGB 13.5 06/17/2023 0858   HGB 13.2 01/25/2012 1944   HCT 40.9 06/17/2023 0858   HCT 37.8 01/25/2012 1944   PLT 342 06/17/2023 0858   PLT 259 01/25/2012 1944   MCV 95.6 06/17/2023 0858   MCV 95 01/25/2012 1944   NEUTROABS 10.3 (H)  06/17/2023 0858   NEUTROABS 5.7 01/25/2012 1944   LYMPHSABS 2.0 06/17/2023 0858   LYMPHSABS 2.3 01/25/2012 1944   MONOABS 0.9 06/17/2023 0858   MONOABS 0.6 01/25/2012 1944   EOSABS 0.3 06/17/2023 0858   EOSABS 0.2 01/25/2012 1944   BASOSABS 0.1 06/17/2023 0858   BASOSABS 0.1 01/25/2012 1944   Comprehensive Metabolic Panel:    Component Value Date/Time   NA 136 06/17/2023 0858   NA 140 01/25/2012 1944   K 3.7 06/17/2023 0858   K 3.7 01/25/2012 1944   CL 103 06/17/2023 0858   CL 108 (H) 01/25/2012 1944   CO2 23 06/17/2023 0858   CO2 22 01/25/2012 1944   BUN 16 06/17/2023 0858   BUN 14 01/25/2012 1944   CREATININE 0.80 06/17/2023 0858   CREATININE 0.58 (L) 03/24/2012 0719   GLUCOSE 117 (H) 06/17/2023 0858   GLUCOSE 101 (H) 01/25/2012 1944   CALCIUM 9.2 06/17/2023 0858   CALCIUM 9.2 01/25/2012 1944   AST 21 06/17/2023 0858   ALT 20 06/17/2023 0858   ALT 38 01/01/2012 0525   ALKPHOS 90 06/17/2023 0858   ALKPHOS 89 01/01/2012 0525   BILITOT 0.3 06/17/2023 0858   PROT 7.6 06/17/2023 0858   PROT 7.5 01/01/2012 0525   ALBUMIN 3.8 06/17/2023 0858   ALBUMIN 3.7 01/01/2012 0525    RADIOGRAPHIC STUDIES: IR IMAGING GUIDED PORT INSERTION Result Date: 06/03/2023 CLINICAL DATA:  Right lung carcinoma and need for porta cath for chemotherapy. EXAM: IMPLANTED PORT A CATH PLACEMENT WITH ULTRASOUND AND FLUOROSCOPIC GUIDANCE ANESTHESIA/SEDATION: Full moderate conscious sedation was not administered and the patient received 2 mg of IV Versed and 25 mg of IV Benadryl. FLUOROSCOPY: 30 seconds.  7.6 mGy. PROCEDURE: The procedure, risks, benefits, and alternatives were explained to the patient. Questions regarding the procedure were encouraged and answered. The patient understands and consents to the procedure. A time-out was performed prior to initiating the procedure. The right side was chosen for port placement after review of anatomy by prior imaging given the patient's body habitus as well as  course of left-sided central veins which would have made advancement of a left-sided port catheter potentially difficult. Ultrasound was utilized to confirm patency of the right internal jugular vein. An ultrasound image was saved and recorded. The right neck and chest were prepped with chlorhexidine in a sterile fashion, and a sterile drape was applied covering the operative field. Maximum barrier sterile technique with sterile gowns and gloves were used for the procedure. Local anesthesia was provided with 1% lidocaine. After creating a small venotomy incision, a 21 gauge  needle was advanced into the right internal jugular vein under direct, real-time ultrasound guidance. Ultrasound image documentation was performed. After securing guidewire access, an 8 Fr dilator was placed. A J-wire was kinked to measure appropriate catheter length. A subcutaneous port pocket was then created along the upper chest wall utilizing sharp and blunt dissection. Portable cautery was utilized. The pocket was irrigated with sterile saline. A single lumen power injectable port was chosen for placement. The 8 Fr catheter was tunneled from the port pocket site to the venotomy incision. The port was placed in the pocket. External catheter was trimmed to appropriate length based on guidewire measurement. At the venotomy, an 8 Fr peel-away sheath was placed over a guidewire. The catheter was then placed through the sheath and the sheath removed. Final catheter positioning was confirmed and documented with a fluoroscopic spot image. The port was accessed with a needle and aspirated and flushed with heparinized saline. The access needle was removed. The venotomy and port pocket incisions were closed with subcutaneous 3-0 Monocryl and subcuticular 4-0 Vicryl. Dermabond was applied to both incisions. COMPLICATIONS: COMPLICATIONS None FINDINGS: After catheter placement, the tip lies at the cavo-atrial junction. The catheter aspirates normally and  is ready for immediate use. IMPRESSION: Placement of single lumen port a cath via right internal jugular vein. The catheter tip lies at the cavo-atrial junction. A power injectable port a cath was placed and is ready for immediate use. Electronically Signed   By: Irish Lack M.D.   On: 06/03/2023 13:17    PERFORMANCE STATUS (ECOG) : 2 - Symptomatic, <50% confined to bed  Review of Systems Unless otherwise noted, a complete review of systems is negative.  Physical Exam General: NAD Cardiovascular: regular rate and rhythm Pulmonary: clear ant fields Abdomen: soft, nontender, + bowel sounds GU: no suprapubic tenderness Extremities: no edema, no joint deformities Skin: no rashes Neurological: Weakness but otherwise nonfocal  IMPRESSION: I met with patient in infusion.  Introduced palliative care and attempted to establish therapeutic rapport.  Patient tearful as she described recognition of likely long-term poor prognosis.  She says that her mother died of lung cancer and she watched her decline over time and expects the same to happen to her.  Patient states that she is in agreement with current scope of treatment.  She says that she is doing this "for my family" but admits that she originally did not wish to seek treatment.  Symptomatically, patient says she is doing okay.  Denies any new symptomatic complaints or concerns.  Endorses chronic pain and shortness of breath.  She has O2 dependent COPD but states she is generally able to manage it with resting and occasionally will increase her O2 from 2 L to 3 L.  Patient states that she is independent with her ADLs and home care.  Patient has both Medicaid and Medicare.  Discussed possible increased home support.  However, patient states that she does not do well with strangers.  She describes having schizophrenia with early age onset.  She says she has at least 6 voices in her head at all times.  However, patient states that she is able to  manage that without input of psychiatry currently.  Offered to refer to psychiatry but she declined.  Will get input from [facility] as patient will likely need help with coping and support.  Patient does not have advanced directives but states she is interested in establishing those.  She wants to give some consideration to her end-of-life  wishes.  She says quality of life is important to her and she does not think that she would want heroics if there was a high probability that she were bedbound or her quality of life or to suffer as a result.  However, she says she is not ready for DNR/DNI at this time.  PLAN: -Continue current scope of treatment - Referral to social work and [facility] - Patient will benefit from completing MOST form - Follow-up telephone visit 1 month   Patient expressed understanding and was in agreement with this plan. She also understands that She can call the clinic at any time with any questions, concerns, or complaints.     Time Total: 25 minutes  Visit consisted of counseling and education dealing with the complex and emotionally intense issues of symptom management and palliative care in the setting of serious and potentially life-threatening illness.Greater than 50%  of this time was spent counseling and coordinating care related to the above assessment and plan.  Signed by: Laurette Schimke, PhD, NP-C

## 2023-06-17 NOTE — Progress Notes (Signed)
 Nutrition Follow-up:  Patient with lung cancer.  Patient on concurrent radiation and chemotherapy (carbo/taxol).  First dose of chemotherapy today.  Met with patient during infusion.  Reports normal appetite.   Medications: prochlorperazine, prednisone  Labs: reviewed  Anthropometrics:   Weight 271 lb 9.6 oz   277 lb 5 oz on 3/26 283 lb on 8/13   NUTRITION DIAGNOSIS: Food and nutrition related knowledge deficit improved   INTERVENTION:  Encouraged healthy food choices with good/normal appetite.  Reviewed foods to choose with nausea and encouraged taking nausea medication if needed.    MONITORING, EVALUATION, GOAL: weight trends, intake   NEXT VISIT: Thursday, May 1 during infusion  Brandi Fowler B. Elease Hashimoto, CSO, LDN Registered Dietitian [phone]

## 2023-06-17 NOTE — Progress Notes (Signed)
 [facility] CONSULT NOTE  Patient Care Team: Inc, [facility] Services as PCP - General Glory Buff, RN as Oncology Nurse Navigator Earna Coder, MD as Consulting Physician (Oncology)  CHIEF COMPLAINTS/PURPOSE OF CONSULTATION:   Oncology History Overview Note  # MAY 2024- RUL presumed  [poor candidate for biopsy at the time] stage I NCSLC- s/p SBRT.     # PET scan-January/February 2025- the treated right upper lobe nodule demonstrates no significant residual update. However new enlarging mediastinal and right hilar lymph nodes demonstrated on recent CT are hypermetabolic, consistent with metastatic disease.  No evidence of distant metastatic disease. MARCH 2025- SQUAMOUS CELL CA of LN Bx- [Dr.Aleskerov]  # April 11th, 2025- Carbo-Taxol- RT   # Chronic Respiratory failure/COPD- on 2 lits home O2.     Primary cancer of left upper lobe of lung (HCC)  04/24/2023 Initial Diagnosis   Primary cancer of left upper lobe of lung (HCC)   04/24/2023 Cancer Staging   Staging form: Lung, AJCC V9 - Pathologic: Stage IIIA (pT1c, pN2b, cM0) - Signed by Earna Coder, MD on 05/24/2023   06/16/2023 -  Chemotherapy   Patient is on Treatment Plan : LUNG Carboplatin + Paclitaxel + XRT q7d     ' Lung cancer  HISTORY OF PRESENTING ILLNESS: Patient ambulating-independently.  Alone.  Brandi Fowler 58 y.o.  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  In the interim patient underwent port placement.  Underwent chemo education.   Also status post radiation oncology evaluation.  Patient  continues to have chronic shortness of breath chronic cough.  Also unfortunately continues to smoke.    Review of Systems  Constitutional:  Positive for malaise/fatigue. Negative for  chills, diaphoresis, fever and weight loss.  HENT:  Negative for nosebleeds and sore throat.   Eyes:  Negative for double vision.  Respiratory:  Positive for cough and shortness of breath. Negative for hemoptysis, sputum production and wheezing.   Cardiovascular:  Negative for chest pain, palpitations, orthopnea and leg swelling.  Gastrointestinal:  Negative for abdominal pain, blood in stool, constipation, diarrhea, heartburn, melena, nausea and vomiting.  Genitourinary:  Negative for dysuria, frequency and urgency.  Musculoskeletal:  Positive for back pain and joint pain.  Skin: Negative.  Negative for itching and rash.  Neurological:  Negative for dizziness, tingling, focal weakness, weakness and headaches.  Endo/Heme/Allergies:  Does not bruise/bleed easily.  Psychiatric/Behavioral:  Negative for depression. The patient is not nervous/anxious and does not have insomnia.     MEDICAL HISTORY:  Past Medical History:  Diagnosis Date   BRBPR (bright red blood per rectum)    CAP (community acquired pneumonia)    Chronic diarrhea    Chronic kidney disease    Chronic myofascial pain    Chronic pain    Chronic venous insufficiency    Cigarette smoker    COPD (chronic obstructive pulmonary disease) (HCC)    Coronary artery disease    DDD (degenerative disc disease), cervical    DDD (degenerative disc disease), thoracic    Depression    Elevated blood pressure reading without diagnosis of hypertension    Elevated hemoglobin A1c    Female hirsutism    GERD (gastroesophageal reflux disease)    Headache    migraines   History of adenomatous polyp of  colon    History of kidney stones    History of sepsis    Kidney stones    Lymphedema    Mediastinal adenopathy    Morbid obesity (HCC)    Obesity, Class III, BMI 40-49.9 (morbid obesity) (HCC)    OSA (obstructive sleep apnea)    Paranoid schizophrenia (HCC)    Peripheral vascular disease (HCC)    Primary cancer of left upper lobe of  lung (HCC)    PTSD (post-traumatic stress disorder)    RLS (restless legs syndrome)    Seizures (HCC)    last seizure in Jan 2025   Sleep apnea    Supplemental oxygen dependent    2-3 L [place]   Syncope     SURGICAL HISTORY: Past Surgical History:  Procedure Laterality Date   CERVICAL SPINE SURGERY     Fusion C6-7   CHOLECYSTECTOMY     COLONOSCOPY WITH PROPOFOL N/A 11/18/2014   Procedure: COLONOSCOPY WITH PROPOFOL;  Surgeon: Scot Jun, MD;  Location: [facility] ENDOSCOPY;  Service: Endoscopy;  Laterality: N/A;   ESOPHAGOGASTRODUODENOSCOPY     FLEXIBLE BRONCHOSCOPY N/A 05/13/2023   Procedure: BRONCHOSCOPY, FLEXIBLE;  Surgeon: Vida Rigger, MD;  Location: [facility] ORS;  Service: Thoracic;  Laterality: N/A;   IR IMAGING GUIDED PORT INSERTION  06/03/2023   KNEE SURGERY Left    polpectomy N/A    TUBAL LIGATION     VIDEO BRONCHOSCOPY WITH ENDOBRONCHIAL ULTRASOUND N/A 05/13/2023   Procedure: BRONCHOSCOPY, WITH EBUS;  Surgeon: Vida Rigger, MD;  Location: [facility] ORS;  Service: Thoracic;  Laterality: N/A;    SOCIAL HISTORY: Social History   Socioeconomic History   Marital status: Single    Spouse name: Not on file   Number of children: Not on file   Years of education: Not on file   Highest education level: Not on file  Occupational History   Not on file  Tobacco Use   Smoking status: Every Day    Current packs/day: 0.25    Average packs/day: 0.3 packs/day for 45.0 years (11.3 ttl pk-yrs)    Types: Cigarettes   Smokeless tobacco: Former  Building services engineer status: Never Used  Substance and Sexual Activity   Alcohol use: Not Currently    Alcohol/week: 1.0 standard drink of alcohol    Types: 1 Standard drinks or equivalent per week   Drug use: No   Sexual activity: Yes  Other Topics Concern   Not on file  Social History Narrative   Not on file   Social Drivers of Health   Financial Resource Strain: Low Risk  (03/29/2023)   Received from [facility]    Overall Financial Resource Strain (CARDIA)    Difficulty of Paying Living Expenses: Not hard at all  Food Insecurity: No Food Insecurity (03/29/2023)   Received from [facility]   Hunger Vital Sign    Ran Out of Food in the Last Year: Never true    Worried About Running Out of Food in the Last Year: Never true  Transportation Needs: No Transportation Needs (03/29/2023)   Received from [facility]   PRAPARE - Transportation    Lack of Transportation (Non-Medical): No    In the past 12 months, has lack of transportation kept you from medical appointments or from getting medications?: No  Physical Activity: Insufficiently Active (08/31/2018)   Exercise Vital Sign    Days of Exercise per Week: 7 days    Minutes of Exercise per  Session: 10 min  Stress: Stress Concern Present (08/31/2018)   [employer] of Occupational Health - Occupational Stress Questionnaire    Feeling of Stress : Very much  Social Connections: Moderately Isolated (08/31/2018)   Social Connection and Isolation Panel [NHANES]    Frequency of Communication with Friends and Family: More than three times a week    Frequency of Social Gatherings with Friends and Family: Once a week    Attends Religious Services: Never    Database administrator or Organizations: No    Attends Banker Meetings: Never    Marital Status: Living with partner  Intimate Partner Violence: Not At Risk (05/07/2022)   Humiliation, Afraid, Rape, and Kick questionnaire    Fear of Current or Ex-Partner: No    Emotionally Abused: No    Physically Abused: No    Sexually Abused: No    FAMILY HISTORY: Family History  Problem Relation Age of Onset   Alcohol abuse Mother    Cancer Mother    COPD Mother    Hearing loss Mother    Vision loss Mother    Alcohol abuse Father    Depression Father    Early death Father    Mental illness Father     ALLERGIES:  is allergic to aripiprazole, baclofen,  buprenorphine-naloxone, clonazepam, cyclobenzaprine, doxepin, fentanyl, lurasidone, methadone, oxymorphone, penicillins, pregabalin, quetiapine, tolmetin, trazodone, acetaminophen, bupropion, colestipol, gabapentin, hydrocodone, paroxetine, sertraline, amitriptyline hcl, carbamazepine, ciprofloxacin hcl, codeine, divalproex sodium, etodolac, haloperidol, hyaluronate sodium, hydrochlorothiazide, ibuprofen, indocin [indomethacin], iodinated contrast media, ketoprofen, latuda [lurasidone hcl], lidocaine, meloxicam, metformin and related, methocarbamol, nabumetone, naproxen, ondansetron, opana [oxymorphone hcl], oxaprozin, perphenazine, risperidone and related, seroquel [quetiapine fumarate], silver, suboxone [buprenorphine hcl-naloxone hcl], tramadol, valproic acid, zoloft [sertraline hcl], bromfenac, buprenorphine, ciprofloxacin, clindamycin, clindamycin/lincomycin, flurbiprofen, metronidazole, and mirtazapine.  MEDICATIONS:  Current Outpatient Medications  Medication Sig Dispense Refill   albuterol (ACCUNEB) 1.25 MG/3ML nebulizer solution Take 1 ampule by nebulization every 6 (six) hours as needed for wheezing.     albuterol (VENTOLIN HFA) 108 (90 Base) MCG/ACT inhaler Inhale 2 puffs into the lungs every 4 (four) hours as needed.     Benzocaine, Topical, 20 % OINT Apply topically to port site 1-2 hours prior to port access. 28 g 2   naloxone (NARCAN) nasal spray 4 mg/0.1 mL CALL 911. INSTILL 1 SPRAY IN ONE NOSTRIL. MAY REPEAT Q 2 TO 3 MINUTES IF SYMPTOMS OF AN OPIOID EMERGENCY PERSISTS. ALTERNATE NOSTRILS.     oxyCODONE (ROXICODONE) 15 MG immediate release tablet Take 15 mg by mouth in the morning, at noon, in the evening, and at bedtime.     OXYGEN Inhale 2-3 L into the lungs continuous.     predniSONE (DELTASONE) 5 MG tablet Take 1 tablet by mouth daily with breakfast.     prochlorperazine (COMPAZINE) 10 MG tablet Take 1 tablet (10 mg total) by mouth every 6 (six) hours as needed for nausea or vomiting.  40 tablet 1   roflumilast (DALIRESP) 500 MCG TABS tablet Take 1 tablet by mouth every morning.     rOPINIRole (REQUIP) 0.5 MG tablet Take 1 tablet (0.5 mg total) by mouth at bedtime. (Patient taking differently: Take 1 mg by mouth at bedtime.) 30 tablet 11   sulfamethoxazole-trimethoprim (BACTRIM) 400-80 MG tablet Take 1 tablet by mouth 3 (three) times a week.     TRELEGY ELLIPTA 100-62.5-25 MCG/ACT AEPB Inhale 1 puff into the lungs every morning.     EPINEPHrine 0.3 mg/0.3 mL IJ SOAJ injection Inject  0.3 mg into the muscle as needed for anaphylaxis. (Patient not taking: Reported on 05/24/2023)     No current facility-administered medications for this visit.    PHYSICAL EXAMINATION:   Vitals:   06/17/23 0915  BP: 98/65     Filed Weights   06/17/23 0915  Weight: 271 lb 9.6 oz (123.2 kg)      Physical Exam Vitals and nursing note reviewed.  HENT:     Head: Normocephalic and atraumatic.     Mouth/Throat:     Pharynx: Oropharynx is clear.  Eyes:     Extraocular Movements: Extraocular movements intact.     Pupils: Pupils are equal, round, and reactive to light.  Cardiovascular:     Rate and Rhythm: Normal rate and regular rhythm.  Pulmonary:     Comments: Decreased breath sounds bilaterally.  Abdominal:     Palpations: Abdomen is soft.  Musculoskeletal:        General: Normal range of motion.     Cervical back: Normal range of motion.  Skin:    General: Skin is warm.  Neurological:     General: No focal deficit present.     Mental Status: She is alert and oriented to person, place, and time.  Psychiatric:        Behavior: Behavior normal.        Judgment: Judgment normal.     LABORATORY DATA:  I have reviewed the data as listed Lab Results  Component Value Date   WBC 13.8 (H) 06/17/2023   HGB 13.5 06/17/2023   HCT 40.9 06/17/2023   MCV 95.6 06/17/2023   PLT 342 06/17/2023   Recent Labs    04/29/23 1458 06/02/23 1200 06/17/23 0858  NA 136 138 136  K 4.0  4.1 3.7  CL 101 100 103  CO2 24 25 23   GLUCOSE 104* 94 117*  BUN 16 13 16   CREATININE 0.63 0.84 0.80  CALCIUM 9.3 9.0 9.2  GFRNONAA >60 >60 >60  PROT 7.6 7.1 7.6  ALBUMIN 4.0 3.5 3.8  AST 18 20 21   ALT 21 24 20   ALKPHOS 81 83 90  BILITOT 0.4 0.4 0.3    RADIOGRAPHIC STUDIES: I have personally reviewed the radiological images as listed and agreed with the findings in the report. IR IMAGING GUIDED PORT INSERTION Result Date: 06/03/2023 CLINICAL DATA:  Right lung carcinoma and need for porta cath for chemotherapy. EXAM: IMPLANTED PORT A CATH PLACEMENT WITH ULTRASOUND AND FLUOROSCOPIC GUIDANCE ANESTHESIA/SEDATION: Full moderate conscious sedation was not administered and the patient received 2 mg of IV Versed and 25 mg of IV Benadryl. FLUOROSCOPY: 30 seconds.  7.6 mGy. PROCEDURE: The procedure, risks, benefits, and alternatives were explained to the patient. Questions regarding the procedure were encouraged and answered. The patient understands and consents to the procedure. A time-out was performed prior to initiating the procedure. The right side was chosen for port placement after review of anatomy by prior imaging given the patient's body habitus as well as course of left-sided central veins which would have made advancement of a left-sided port catheter potentially difficult. Ultrasound was utilized to confirm patency of the right internal jugular vein. An ultrasound image was saved and recorded. The right neck and chest were prepped with chlorhexidine in a sterile fashion, and a sterile drape was applied covering the operative field. Maximum barrier sterile technique with sterile gowns and gloves were used for the procedure. Local anesthesia was provided with 1% lidocaine. After creating a small venotomy incision, a  21 gauge needle was advanced into the right internal jugular vein under direct, real-time ultrasound guidance. Ultrasound image documentation was performed. After securing guidewire  access, an 8 Fr dilator was placed. A J-wire was kinked to measure appropriate catheter length. A subcutaneous port pocket was then created along the upper chest wall utilizing sharp and blunt dissection. Portable cautery was utilized. The pocket was irrigated with sterile saline. A single lumen power injectable port was chosen for placement. The 8 Fr catheter was tunneled from the port pocket site to the venotomy incision. The port was placed in the pocket. External catheter was trimmed to appropriate length based on guidewire measurement. At the venotomy, an 8 Fr peel-away sheath was placed over a guidewire. The catheter was then placed through the sheath and the sheath removed. Final catheter positioning was confirmed and documented with a fluoroscopic spot image. The port was accessed with a needle and aspirated and flushed with heparinized saline. The access needle was removed. The venotomy and port pocket incisions were closed with subcutaneous 3-0 Monocryl and subcuticular 4-0 Vicryl. Dermabond was applied to both incisions. COMPLICATIONS: COMPLICATIONS None FINDINGS: After catheter placement, the tip lies at the cavo-atrial junction. The catheter aspirates normally and is ready for immediate use. IMPRESSION: Placement of single lumen port a cath via right internal jugular vein. The catheter tip lies at the cavo-atrial junction. A power injectable port a cath was placed and is ready for immediate use. Electronically Signed   By: Irish Lack M.D.   On: 06/03/2023 13:17    Primary cancer of left upper lobe of lung (HCC) # PET scan-January/February 2025- the treated right upper lobe nodule demonstrates no significant residual update [s/p SBRT 2024]. However new enlarging mediastinal and right hilar lymph nodes demonstrated on recent CT are hypermetabolic, consistent with metastatic disease.  No evidence of distant metastatic disease. MARCH 2025- SQUAMOUS CELL CA of LN Bx- [Dr.Aleskerov].    # MARCH  2025- Recurrent-stage III squamous of carcinoma- APRIL- 2025-CarboTaxol RT-followed by durvalumab  # Proceed with cycle #1 of CarboTaxol weekly of planned 6-8 cycles. [with RT- 5/22]. Labs-CBC/chemistries were reviewed with the patient.  # Long discussion with the patient regarding the goal of treatment of stage III lung cancer-goal is cure; however only but small chance of cure.   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   # Chronic respiratory failure/COPD on 2 lits [Dr.Fleming]- stable.   # Active smoker [since age of 6-8]- tried Chantix- failed multiple interventions.  Again encouraged to quit smoking.  # Multiple reactions at baseline- monitor closely   # IV access: Mediport placement functional  # Prognosis:  Also discussed with patient's PCP updated of the overall grim prognosis.  PS- # DISPOSITION: #  today chemo- cabo-taxol #  in 1 week-MD; port labs-CBC CMP carbo-taxol #  in 2 week MD; port labs-CBC CMP carbo-taxol # in 3 week-MD; port labs-CBC CMP carbo-taxol-Dr.B  Above plan of care was discussed with patient/family in detail.  My contact information was given to the patient/family.     Earna Coder, MD 06/17/2023 9:53 AM

## 2023-06-17 NOTE — Progress Notes (Signed)
 Sense of depth perception is off. 1st chemo today. Has done 4 radiation Txs. Breathing is about the same. Having more phlegm, having allergies.Has diarrhea has had it her whole life.

## 2023-06-17 NOTE — Telephone Encounter (Signed)
 Clinical Social Work was referred by Elouise Munroe, NP, for assessment of psychosocial needs.  CSW attempted to contact patient by phone.  Could not leave a voicemail.

## 2023-06-17 NOTE — Assessment & Plan Note (Addendum)
#   PET scan-January/February 2025- the treated right upper lobe nodule demonstrates no significant residual update [s/p SBRT 2024]. However new enlarging mediastinal and right hilar lymph nodes demonstrated on recent CT are hypermetabolic, consistent with metastatic disease.  No evidence of distant metastatic disease. MARCH 2025- SQUAMOUS CELL CA of LN Bx- [Dr.Aleskerov].    # MARCH 2025- Recurrent-stage III squamous of carcinoma- APRIL- 2025-CarboTaxol RT-followed by durvalumab  # Proceed with cycle #1 of CarboTaxol weekly of planned 6-8 cycles. [with RT- 5/22]. Labs-CBC/chemistries were reviewed with the patient.  # Long discussion with the patient regarding the goal of treatment of stage III lung cancer-goal is cure; however only but small chance of cure.   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   # Chronic respiratory failure/COPD on 2 lits [Dr.Fleming]- stable.   # Active smoker [since age of 6-8]- tried Chantix- failed multiple interventions.  Again encouraged to quit smoking.  # Multiple reactions at baseline- monitor closely   # IV access: Mediport placement functional  # Prognosis:  Also discussed with patient's PCP updated of the overall grim prognosis.  PS- # DISPOSITION: #  today chemo- cabo-taxol #  in 1 week-MD; port labs-CBC CMP carbo-taxol #  in 2 week MD; port labs-CBC CMP carbo-taxol # in 3 week-MD; port labs-CBC CMP carbo-taxol-Dr.B

## 2023-06-17 NOTE — Patient Instructions (Signed)
 [facility] - A DEPT OF [facility]  Discharge Instructions: Thank you for choosing [facility] to provide your oncology and hematology care.  If you have a lab appointment with the Cancer Center, please go directly to the Cancer Center and check in at the registration area.  Wear comfortable clothing and clothing appropriate for easy access to any Portacath or PICC line.   We strive to give you quality time with your provider. You may need to reschedule your appointment if you arrive late (15 or more minutes).  Arriving late affects you and other patients whose appointments are after yours.  Also, if you miss three or more appointments without notifying the office, you may be dismissed from the clinic at the provider's discretion.      For prescription refill requests, have your pharmacy contact our office and allow 72 hours for refills to be completed.    Today you received the following chemotherapy and/or immunotherapy agents Paclitaxel and Carboplatin      To help prevent nausea and vomiting after your treatment, we encourage you to take your nausea medication as directed.  BELOW ARE SYMPTOMS THAT SHOULD BE REPORTED IMMEDIATELY: *FEVER GREATER THAN 100.4 F (38 C) OR HIGHER *CHILLS OR SWEATING *NAUSEA AND VOMITING THAT IS NOT CONTROLLED WITH YOUR NAUSEA MEDICATION *UNUSUAL SHORTNESS OF BREATH *UNUSUAL BRUISING OR BLEEDING *URINARY PROBLEMS (pain or burning when urinating, or frequent urination) *BOWEL PROBLEMS (unusual diarrhea, constipation, pain near the anus) TENDERNESS IN MOUTH AND THROAT WITH OR WITHOUT PRESENCE OF ULCERS (sore throat, sores in mouth, or a toothache) UNUSUAL RASH, SWELLING OR PAIN  UNUSUAL VAGINAL DISCHARGE OR ITCHING   Items with * indicate a potential emergency and should be followed up as soon as possible or go to the Emergency Department if any problems should occur.  Please show the CHEMOTHERAPY ALERT CARD or  IMMUNOTHERAPY ALERT CARD at check-in to the Emergency Department and triage nurse.  Should you have questions after your visit or need to cancel or reschedule your appointment, please contact [facility] - A DEPT OF [facility]  [phone] and follow the prompts.  Office hours are 8:00 a.m. to 4:30 p.m. Monday - Friday. Please note that voicemails left after 4:00 p.m. may not be returned until the following business day.  We are closed weekends and major holidays. You have access to a nurse at all times for urgent questions. Please call the main number to the clinic [phone] and follow the prompts.  For any non-urgent questions, you may also contact your provider using MyChart. We now offer e-Visits for anyone 39 and older to request care online for non-urgent symptoms. For details visit [email or web address].   Also download the MyChart app! Go to the app store, search "MyChart", open the app, select [place], and log in with your MyChart username and password.

## 2023-06-17 NOTE — Progress Notes (Addendum)
 Pt with allergy listed to zofran in chart. Per RN/patient patient states she has blood in urine as the reaction. After discussion with Dr, Kizzie Bane, OK to proceed with aloxi in treatment plan as pre-med. Will continue to monitor   Sharen Hones, PharmD, BCPS Clinical Pharmacist

## 2023-06-17 NOTE — Progress Notes (Signed)
 Met with patient during follow up visit with Dr. Donneta Romberg to start chemotherapy treatment. All questions answered during visit. Pt anxious about treatment today. Reassurance provided. Instructed to call with any questions or needs. Pt verbalized understanding.

## 2023-06-20 ENCOUNTER — Ambulatory Visit
Admission: RE | Admit: 2023-06-20 | Discharge: 2023-06-20 | Disposition: A | Discharge status: Home / Self Care | Source: Ambulatory Visit | Attending: Radiation Oncology | Admitting: Radiation Oncology

## 2023-06-20 ENCOUNTER — Inpatient Hospital Stay

## 2023-06-20 ENCOUNTER — Other Ambulatory Visit: Payer: Self-pay

## 2023-06-20 ENCOUNTER — Telehealth: Payer: Self-pay | Admitting: *Deleted

## 2023-06-20 DIAGNOSIS — Z5111 Encounter for antineoplastic chemotherapy: Secondary | ICD-10-CM | POA: Diagnosis not present

## 2023-06-20 LAB — RAD ONC ARIA SESSION SUMMARY
Course Elapsed Days: 6
Plan Fractions Treated to Date: 5
Plan Prescribed Dose Per Fraction: 2 Gy
Plan Total Fractions Prescribed: 33
Plan Total Prescribed Dose: 66 Gy
Reference Point Dosage Given to Date: 10 Gy
Reference Point Session Dosage Given: 2 Gy
Session Number: 5

## 2023-06-20 NOTE — Telephone Encounter (Signed)
 She is receiving radiation to the chest.

## 2023-06-20 NOTE — Progress Notes (Signed)
 [facility] CSW Counseling Note  Patient was referred by medical provider, Josh Borders. Treatment type: Individual  Presenting Concerns: Patient and/or family reports the following symptoms/concerns: depression Duration of problem: several months; Severity of problem: moderate   Orientation:oriented to person, place, time/date, situation, day of week, month of year, and year.   Affect: NA Risk of harm to self or others: No plan to harm self or others  Patient and/or Family's Strengths/Protective Factors: Social connections, Concrete supports in place (healthy food, safe environments, etc.), and Sense of purposeCapable of independent living  Communication skills  General fund of knowledge  Special hobby/interest  Supportive family/friends      Goals Addressed: Patient will:  Reduce symptoms of: depression Increase knowledge and/or ability of: self-management skills  Increase healthy adjustment to current life circumstances   Progress towards Goals: Initial   Interventions: Interventions utilized:  Motivational Interviewing, Strength-based, and Supportive      Assessment: Patient currently experiencing increased sadness due to her cancer diagnosis.  She stated she feels this is a normal emotion related to her experience.  Her boyfriend of 25 years is very support, but works out of town during the week.  She has two daughters and a son, but they do not live in the area.  She said she is a great grandmother, with two more on the way. Patient reports having six different voices that she hears at various times.  "I am never alone."  She said they are nonthreatening and does not wish for medical intervention for them at this time.  Her lack of sleep continues to be an issue and her medical team is aware of this.      Plan: Follow up with CSW: Phone call on 4/28. Behavioral recommendations: Continue caring for three dogs which bring joy. Referral(s): Support group(s):  at the cancer  center.       Brandi Jesus Stephenie Navejas, LCSW   Patient is participating in a Managed Medicaid Plan:  Yes

## 2023-06-20 NOTE — Telephone Encounter (Signed)
 Left leg itching on Friday. She states that it was itchy only and then had blister like looking. The itching stopped and the blisters not bother her and it got a little bigger over the weekend. She comes for radiation daily. Not sure if it is from chemo carbo and taxol or radiation or neither. She comes to radiation. Please let me know if she needs  [facility]

## 2023-06-21 ENCOUNTER — Ambulatory Visit
Admission: RE | Admit: 2023-06-21 | Discharge: 2023-06-21 | Disposition: A | Discharge status: Home / Self Care | Source: Ambulatory Visit | Attending: Radiation Oncology | Admitting: Radiation Oncology

## 2023-06-21 ENCOUNTER — Inpatient Hospital Stay (HOSPITAL_BASED_OUTPATIENT_CLINIC_OR_DEPARTMENT_OTHER): Admitting: Nurse Practitioner

## 2023-06-21 ENCOUNTER — Other Ambulatory Visit: Payer: Self-pay

## 2023-06-21 ENCOUNTER — Encounter: Payer: Self-pay | Admitting: Nurse Practitioner

## 2023-06-21 VITALS — BP 120/80 | HR 107 | Temp 96.8°F | Resp 18 | Wt 272.0 lb

## 2023-06-21 DIAGNOSIS — Z5111 Encounter for antineoplastic chemotherapy: Secondary | ICD-10-CM | POA: Diagnosis not present

## 2023-06-21 DIAGNOSIS — L259 Unspecified contact dermatitis, unspecified cause: Secondary | ICD-10-CM

## 2023-06-21 LAB — RAD ONC ARIA SESSION SUMMARY
Course Elapsed Days: 7
Plan Fractions Treated to Date: 6
Plan Prescribed Dose Per Fraction: 2 Gy
Plan Total Fractions Prescribed: 33
Plan Total Prescribed Dose: 66 Gy
Reference Point Dosage Given to Date: 12 Gy
Reference Point Session Dosage Given: 2 Gy
Session Number: 6

## 2023-06-21 MED ORDER — TRIAMCINOLONE ACETONIDE 0.1 % EX CREA: 1.0000 | TOPICAL_CREAM | Freq: Two times a day (BID) | CUTANEOUS | 0 refills | Status: DC

## 2023-06-21 NOTE — Telephone Encounter (Signed)
 Patient seen in Radiation clinic yesterday. She has been scheduled of [facility] after her treatment today for assessment and treatment if needed.

## 2023-06-21 NOTE — Progress Notes (Signed)
 Symptom Management Clinic  [facility] at [facility] Department of the [place]. [facility] [address] [place], [place] [address] [phone] (phone) [phone] (fax)  Patient Care Team: Inc, [facility] as PCP - General [place], [place], RN as Oncology Nurse Navigator Earna Coder, MD as Consulting Physician (Oncology)   Name of the patient: Brandi Fowler  [id number]  10/30/1965   Date of visit: 06/21/23  Diagnosis- Lung Cancer  Chief complaint/ Reason for visit- rash  Heme/Onc history:  Oncology History Overview Note  # MAY 2024- RUL presumed  [poor candidate for biopsy at the time] stage I NCSLC- s/p SBRT.     # PET scan-January/February 2025- the treated right upper lobe nodule demonstrates no significant residual update. However new enlarging mediastinal and right hilar lymph nodes demonstrated on recent CT are hypermetabolic, consistent with metastatic disease.  No evidence of distant metastatic disease. MARCH 2025- SQUAMOUS CELL CA of LN Bx- [Dr.Aleskerov]  # April 11th, 2025- Carbo-Taxol- RT   # Chronic Respiratory failure/COPD- on 2 lits home O2.     Primary cancer of left upper lobe of lung (HCC)  04/24/2023 Initial Diagnosis   Primary cancer of left upper lobe of lung (HCC)   04/24/2023 Cancer Staging   Staging form: Lung, AJCC V9 - Pathologic: Stage IIIA (pT1c, pN2b, cM0) - Signed by Earna Coder, MD on 05/24/2023   06/17/2023 -  Chemotherapy   Patient is on Treatment Plan : LUNG Carboplatin + Paclitaxel + XRT q7d       Interval history- Patient is 58 year old female who presents to symptom management clinic for complaints of rash on back of her left lower leg. Symptoms started night after chemotherapy. Described as initially itchy and blistered appearing. Itching has resolved but blisters persist. Not painful. Denies worsening redness or swelling. She denies other complaints  and otherwise feels at her baseline level of health. Feels that she tolerated her first chemo well.   Review of systems- Review of Systems  Constitutional:  Negative for chills and fever.  Cardiovascular:  Positive for leg swelling (chronic).  Skin:  Positive for itching and rash.       Denies wounds    Current treatment- carbo-taxol & radiation  Allergies  Allergen Reactions   Aripiprazole Swelling    Other Reaction(s): Other (See Comments)  Other reaction(s): Edema   Baclofen Swelling   Buprenorphine-Naloxone Other (See Comments)    Other reaction(s): Other (See Comments), Other (See Comments), seizure  SEIZURE   Clonazepam Rash   Cyclobenzaprine Nausea And Vomiting and Swelling    Other reaction(s): swelling, vomiting   Doxepin Rash   Fentanyl Nausea And Vomiting    Other reaction(s): Other (See Comments)   Lurasidone Swelling and Nausea Only    Other reaction(s): unknown   Methadone Swelling    Other reaction(s): Angioedema, n/v   Oxymorphone Other (See Comments)    Other reaction(s): Other (See Comments), Other (See Comments), Other (See Comments), seizure  SEIZURE  seizures   Penicillins Swelling    Other reaction(s): Throat swells  Other reaction(s): Edema, Other (See Comments), UNKNOWN   Pregabalin Nausea Only and Swelling   Quetiapine Other (See Comments), Diarrhea and Nausea Only    Other reaction(s): Other (See Comments), swelling   Tolmetin Rash   Trazodone Itching and Hives   Acetaminophen Nausea And Vomiting, Diarrhea and Nausea Only    Other reaction(s): diarrhea, nausea, vomiting, Other (See Comments)  Bupropion Other (See Comments)    Other reaction(s): Abdominal Pain   Colestipol     Other reaction(s): blood in urine, tingling in hands, feet, legs, Other (See Comments)  Urine in blood tingling in hands feet legs   Gabapentin Nausea Only   Hydrocodone Itching and Nausea Only    Other reaction(s): rash, swelling, vomiting   Paroxetine Nausea Only     Other reaction(s): unknown   Sertraline Diarrhea and Nausea And Vomiting   Amitriptyline Hcl    Carbamazepine Hives   Ciprofloxacin Hcl Itching   Codeine    Divalproex Sodium Diarrhea   Etodolac Swelling   Haloperidol     Other reaction(s): Headache   Hyaluronate Sodium    Hydrochlorothiazide     unknown   Ibuprofen Other (See Comments)    Bleeding in stomach   Indocin [Indomethacin]    Iodinated Contrast Media Itching   Ketoprofen    Latuda [Lurasidone Hcl]    Lidocaine Hives   Meloxicam    Metformin And Related     unknown   Methocarbamol     unknown   Nabumetone    Naproxen Hives and Nausea Only   Ondansetron    Opana [Oxymorphone Hcl] Other (See Comments)    seizures   Oxaprozin Hives   Perphenazine Hives   Risperidone And Related Itching   Seroquel [Quetiapine Fumarate]    Silver    Suboxone [Buprenorphine Hcl-Naloxone Hcl] Other (See Comments)    seizures   Tramadol    Valproic Acid Diarrhea   Zoloft [Sertraline Hcl] Nausea And Vomiting   Bromfenac Rash   Buprenorphine Rash   Ciprofloxacin Itching and Other (See Comments)    Other reaction(s): Unknown   Clindamycin Rash   Clindamycin/Lincomycin Rash   Flurbiprofen Swelling and Rash   Metronidazole     Other reaction(s): blood in urine, tingling in hands, legs, feet, Other (See Comments)  Blood in urine tingling in hands feet legs   Mirtazapine Itching    Other reaction(s): unknown    Past Medical History:  Diagnosis Date   BRBPR (bright red blood per rectum)    CAP (community acquired pneumonia)    Chronic diarrhea    Chronic kidney disease    Chronic myofascial pain    Chronic pain    Chronic venous insufficiency    Cigarette smoker    COPD (chronic obstructive pulmonary disease) (HCC)    Coronary artery disease    DDD (degenerative disc disease), cervical    DDD (degenerative disc disease), thoracic    Depression    Elevated blood pressure reading without diagnosis of hypertension     Elevated hemoglobin A1c    Female hirsutism    GERD (gastroesophageal reflux disease)    Headache    migraines   History of adenomatous polyp of colon    History of kidney stones    History of sepsis    Kidney stones    Lymphedema    Mediastinal adenopathy    Morbid obesity (HCC)    Obesity, Class III, BMI 40-49.9 (morbid obesity) (HCC)    OSA (obstructive sleep apnea)    Paranoid schizophrenia (HCC)    Peripheral vascular disease (HCC)    Primary cancer of left upper lobe of lung (HCC)    PTSD (post-traumatic stress disorder)    RLS (restless legs syndrome)    Seizures (HCC)    last seizure in Jan 2025   Sleep apnea    Supplemental oxygen dependent    2-3  L [place]   Syncope     Past Surgical History:  Procedure Laterality Date   CERVICAL SPINE SURGERY     Fusion C6-7   CHOLECYSTECTOMY     COLONOSCOPY WITH PROPOFOL N/A 11/18/2014   Procedure: COLONOSCOPY WITH PROPOFOL;  Surgeon: Scot Jun, MD;  Location: [facility] ENDOSCOPY;  Service: Endoscopy;  Laterality: N/A;   ESOPHAGOGASTRODUODENOSCOPY     FLEXIBLE BRONCHOSCOPY N/A 05/13/2023   Procedure: BRONCHOSCOPY, FLEXIBLE;  Surgeon: Vida Rigger, MD;  Location: [facility] ORS;  Service: Thoracic;  Laterality: N/A;   IR IMAGING GUIDED PORT INSERTION  06/03/2023   KNEE SURGERY Left    polpectomy N/A    TUBAL LIGATION     VIDEO BRONCHOSCOPY WITH ENDOBRONCHIAL ULTRASOUND N/A 05/13/2023   Procedure: BRONCHOSCOPY, WITH EBUS;  Surgeon: Vida Rigger, MD;  Location: [facility] ORS;  Service: Thoracic;  Laterality: N/A;    Social History   Socioeconomic History   Marital status: Single    Spouse name: Not on file   Number of children: Not on file   Years of education: Not on file   Highest education level: Not on file  Occupational History   Not on file  Tobacco Use   Smoking status: Every Day    Current packs/day: 0.25    Average packs/day: 0.3 packs/day for 45.0 years (11.3 ttl pk-yrs)    Types: Cigarettes   Smokeless tobacco:  Former  Building services engineer status: Never Used  Substance and Sexual Activity   Alcohol use: Not Currently    Alcohol/week: 1.0 standard drink of alcohol    Types: 1 Standard drinks or equivalent per week   Drug use: No   Sexual activity: Yes  Other Topics Concern   Not on file  Social History Narrative   Not on file   Social Drivers of Health   Financial Resource Strain: Low Risk  (03/29/2023)   [facility]   Overall Financial Resource Strain (CARDIA)    Difficulty of Paying Living Expenses: Not hard at all  Food Insecurity: No Food Insecurity (03/29/2023)   Received from [facility]   Hunger Vital Sign    Ran Out of Food in the Last Year: Never true    Worried About Running Out of Food in the Last Year: Never true  Transportation Needs: No Transportation Needs (03/29/2023)   Received from [facility]   PRAPARE - Transportation    Lack of Transportation (Non-Medical): No    In the past 12 months, has lack of transportation kept you from medical appointments or from getting medications?: No  Physical Activity: Insufficiently Active (08/31/2018)   Exercise Vital Sign    Days of Exercise per Week: 7 days    Minutes of Exercise per Session: 10 min  Stress: Stress Concern Present (08/31/2018)   [employer] of Occupational Health - Occupational Stress Questionnaire    Feeling of Stress : Very much  Social Connections: Moderately Isolated (08/31/2018)   Social Connection and Isolation Panel [NHANES]    Frequency of Communication with Friends and Family: More than three times a week    Frequency of Social Gatherings with Friends and Family: Once a week    Attends Religious Services: Never    Database administrator or Organizations: No    Attends Banker Meetings: Never    Marital Status: Living with partner  Intimate Partner Violence: Not At Risk (05/07/2022)   Humiliation, Afraid, Rape, and Kick  questionnaire    Fear of Current or Ex-Partner: No    Emotionally Abused: No    Physically Abused: No    Sexually Abused: No   Family History  Problem Relation Age of Onset   Alcohol abuse Mother    Cancer Mother    COPD Mother    Hearing loss Mother    Vision loss Mother    Alcohol abuse Father    Depression Father    Early death Father    Mental illness Father     Current Outpatient Medications:    albuterol (ACCUNEB) 1.25 MG/3ML nebulizer solution, Take 1 ampule by nebulization every 6 (six) hours as needed for wheezing., Disp: , Rfl:    albuterol (VENTOLIN HFA) 108 (90 Base) MCG/ACT inhaler, Inhale 2 puffs into the lungs every 4 (four) hours as needed., Disp: , Rfl:    Benzocaine, Topical, 20 % OINT, Apply topically to port site 1-2 hours prior to port access., Disp: 28 g, Rfl: 2   EPINEPHrine 0.3 mg/0.3 mL IJ SOAJ injection, Inject 0.3 mg into the muscle as needed for anaphylaxis., Disp: , Rfl:    naloxone (NARCAN) nasal spray 4 mg/0.1 mL, CALL 911. INSTILL 1 SPRAY IN ONE NOSTRIL. MAY REPEAT Q 2 TO 3 MINUTES IF SYMPTOMS OF AN OPIOID EMERGENCY PERSISTS. ALTERNATE NOSTRILS., Disp: , Rfl:    oxyCODONE (ROXICODONE) 15 MG immediate release tablet, Take 15 mg by mouth in the morning, at noon, in the evening, and at bedtime., Disp: , Rfl:    OXYGEN, Inhale 2-3 L into the lungs continuous., Disp: , Rfl:    predniSONE (DELTASONE) 5 MG tablet, Take 1 tablet by mouth daily with breakfast., Disp: , Rfl:    prochlorperazine (COMPAZINE) 10 MG tablet, Take 1 tablet (10 mg total) by mouth every 6 (six) hours as needed for nausea or vomiting., Disp: 40 tablet, Rfl: 1   roflumilast (DALIRESP) 500 MCG TABS tablet, Take 1 tablet by mouth every morning., Disp: , Rfl:    sulfamethoxazole-trimethoprim (BACTRIM) 400-80 MG tablet, Take 1 tablet by mouth 3 (three) times a week., Disp: , Rfl:    TRELEGY ELLIPTA 100-62.5-25 MCG/ACT AEPB, Inhale 1 puff into the lungs every morning., Disp: , Rfl:     rOPINIRole (REQUIP) 0.5 MG tablet, Take 1 tablet (0.5 mg total) by mouth at bedtime. (Patient taking differently: Take 1 mg by mouth at bedtime.), Disp: 30 tablet, Rfl: 11  Physical exam:  Vitals:   06/21/23 1349  BP: 120/80  Pulse: (!) 107  Resp: 18  Temp: (!) 96.8 F (36 C)  TempSrc: Tympanic  SpO2: 96%  Weight: 272 lb (123.4 kg)   Physical Exam Vitals reviewed.  Constitutional:      General: She is not in acute distress. Skin:    Findings: Lesion present.  Neurological:     Mental Status: She is alert.    Back of left lower leg.     Assessment and plan- Patient is a 58 y.o. female currently taking carbo-taxol with concurrent radiation for lung cancer who presents to symptom management clinic for complaints of rash.   Suspect contact dermatitis. Infection less likely. Symptoms improving. Can trial topical triamcinolone if pruritic. Keep clean and dry. Notify clinic if symptoms worsen.    Visit Diagnosis 1. Contact dermatitis, unspecified contact dermatitis type, unspecified trigger    Patient expressed understanding and was in agreement with this plan. She also understands that She can call clinic at any time with any questions, concerns, or complaints.  Thank you for allowing me to participate in the care of this very pleasant patient.   Kenney Peacemaker, DNP, AGNP-C, AOCNP [facility] at [facility] [phone]

## 2023-06-22 ENCOUNTER — Ambulatory Visit
Admission: RE | Admit: 2023-06-22 | Discharge: 2023-06-22 | Disposition: A | Discharge status: Home / Self Care | Source: Ambulatory Visit | Attending: Radiation Oncology | Admitting: Radiation Oncology

## 2023-06-22 ENCOUNTER — Telehealth: Payer: Self-pay | Admitting: Internal Medicine

## 2023-06-22 ENCOUNTER — Other Ambulatory Visit: Payer: Self-pay

## 2023-06-22 DIAGNOSIS — Z5111 Encounter for antineoplastic chemotherapy: Secondary | ICD-10-CM | POA: Diagnosis not present

## 2023-06-22 LAB — RAD ONC ARIA SESSION SUMMARY
Course Elapsed Days: 8
Plan Fractions Treated to Date: 7
Plan Prescribed Dose Per Fraction: 2 Gy
Plan Total Fractions Prescribed: 33
Plan Total Prescribed Dose: 66 Gy
Reference Point Dosage Given to Date: 14 Gy
Reference Point Session Dosage Given: 2 Gy
Session Number: 7

## 2023-06-22 NOTE — Telephone Encounter (Signed)
 Thank you :)

## 2023-06-22 NOTE — Telephone Encounter (Signed)
 Pt wanted to let Dr.B and his team know that her insurance will be sending information regarding pts transportation. Pt stated she runs out of the rides 4/24 and has filed for an extension.

## 2023-06-23 ENCOUNTER — Encounter: Payer: Self-pay | Admitting: Internal Medicine

## 2023-06-23 ENCOUNTER — Inpatient Hospital Stay

## 2023-06-23 ENCOUNTER — Other Ambulatory Visit: Payer: Self-pay

## 2023-06-23 ENCOUNTER — Inpatient Hospital Stay (HOSPITAL_BASED_OUTPATIENT_CLINIC_OR_DEPARTMENT_OTHER): Admitting: Internal Medicine

## 2023-06-23 ENCOUNTER — Ambulatory Visit
Admission: RE | Admit: 2023-06-23 | Discharge: 2023-06-23 | Disposition: A | Discharge status: Home / Self Care | Source: Ambulatory Visit | Attending: Radiation Oncology | Admitting: Radiation Oncology

## 2023-06-23 VITALS — Wt 275.0 lb

## 2023-06-23 VITALS — BP 112/66 | HR 99 | Temp 97.8°F | Resp 24 | Ht 65.0 in | Wt 247.8 lb

## 2023-06-23 DIAGNOSIS — C3412 Malignant neoplasm of upper lobe, left bronchus or lung: Secondary | ICD-10-CM | POA: Diagnosis not present

## 2023-06-23 DIAGNOSIS — Z5111 Encounter for antineoplastic chemotherapy: Secondary | ICD-10-CM | POA: Diagnosis not present

## 2023-06-23 LAB — CBC WITH DIFFERENTIAL (CANCER CENTER ONLY)
Abs Immature Granulocytes: 0.08 10*3/uL — ABNORMAL HIGH (ref 0.00–0.07)
Basophils Absolute: 0.1 10*3/uL (ref 0.0–0.1)
Basophils Relative: 1 %
Eosinophils Absolute: 0.2 10*3/uL (ref 0.0–0.5)
Eosinophils Relative: 2 %
HCT: 38.9 % (ref 36.0–46.0)
Hemoglobin: 12.7 g/dL (ref 12.0–15.0)
Immature Granulocytes: 1 %
Lymphocytes Relative: 15 %
Lymphs Abs: 1.5 10*3/uL (ref 0.7–4.0)
MCH: 31.6 pg (ref 26.0–34.0)
MCHC: 32.6 g/dL (ref 30.0–36.0)
MCV: 96.8 fL (ref 80.0–100.0)
Monocytes Absolute: 0.6 10*3/uL (ref 0.1–1.0)
Monocytes Relative: 6 %
Neutro Abs: 7.7 10*3/uL (ref 1.7–7.7)
Neutrophils Relative %: 75 %
Platelet Count: 311 10*3/uL (ref 150–400)
RBC: 4.02 MIL/uL (ref 3.87–5.11)
RDW: 13.5 % (ref 11.5–15.5)
WBC Count: 10.1 10*3/uL (ref 4.0–10.5)
nRBC: 0 % (ref 0.0–0.2)

## 2023-06-23 LAB — RAD ONC ARIA SESSION SUMMARY
Course Elapsed Days: 9
Plan Fractions Treated to Date: 8
Plan Prescribed Dose Per Fraction: 2 Gy
Plan Total Fractions Prescribed: 33
Plan Total Prescribed Dose: 66 Gy
Reference Point Dosage Given to Date: 16 Gy
Reference Point Session Dosage Given: 2 Gy
Session Number: 8

## 2023-06-23 LAB — CMP (CANCER CENTER ONLY)
ALT: 17 U/L (ref 0–44)
AST: 14 U/L — ABNORMAL LOW (ref 15–41)
Albumin: 3.4 g/dL — ABNORMAL LOW (ref 3.5–5.0)
Alkaline Phosphatase: 73 U/L (ref 38–126)
Anion gap: 9 (ref 5–15)
BUN: 19 mg/dL (ref 6–20)
CO2: 25 mmol/L (ref 22–32)
Calcium: 9 mg/dL (ref 8.9–10.3)
Chloride: 103 mmol/L (ref 98–111)
Creatinine: 0.83 mg/dL (ref 0.44–1.00)
GFR, Estimated: >60 mL/min (ref >60)
Glucose, Bld: 128 mg/dL — ABNORMAL HIGH (ref 70–99)
Potassium: 3.9 mmol/L (ref 3.5–5.1)
Sodium: 137 mmol/L (ref 135–145)
Total Bilirubin: 0.4 mg/dL (ref 0.0–1.2)
Total Protein: 7 g/dL (ref 6.5–8.1)

## 2023-06-23 MED ORDER — SODIUM CHLORIDE 0.9 % IV SOLN
300.0000 mg | Freq: Once | INTRAVENOUS | Status: AC
  Administered 2023-06-23: 300 mg via INTRAVENOUS
  Filled 2023-06-23: qty 30

## 2023-06-23 MED ORDER — DEXAMETHASONE SODIUM PHOSPHATE 10 MG/ML IJ SOLN
10.0000 mg | Freq: Once | INTRAMUSCULAR | Status: AC
  Administered 2023-06-23: 10 mg via INTRAVENOUS
  Filled 2023-06-23: qty 1

## 2023-06-23 MED ORDER — SODIUM CHLORIDE 0.9 % IV SOLN
INTRAVENOUS | Status: DC
  Filled 2023-06-23: qty 250

## 2023-06-23 MED ORDER — DIPHENHYDRAMINE HCL 50 MG/ML IJ SOLN
50.0000 mg | Freq: Once | INTRAMUSCULAR | Status: AC
  Administered 2023-06-23: 50 mg via INTRAVENOUS
  Filled 2023-06-23: qty 1

## 2023-06-23 MED ORDER — HEPARIN SOD (PORK) LOCK FLUSH 100 UNIT/ML IV SOLN
500.0000 [IU] | Freq: Once | INTRAVENOUS | Status: AC | PRN
  Administered 2023-06-23: 500 [IU]
  Filled 2023-06-23: qty 5

## 2023-06-23 MED ORDER — SODIUM CHLORIDE 0.9 % IV SOLN
45.0000 mg/m2 | Freq: Once | INTRAVENOUS | Status: AC
  Administered 2023-06-23: 102 mg via INTRAVENOUS
  Filled 2023-06-23: qty 17

## 2023-06-23 MED ORDER — PALONOSETRON HCL INJECTION 0.25 MG/5ML
0.2500 mg | Freq: Once | INTRAVENOUS | Status: AC
  Administered 2023-06-23: 0.25 mg via INTRAVENOUS
  Filled 2023-06-23: qty 5

## 2023-06-23 MED ORDER — FAMOTIDINE IN NACL 20-0.9 MG/50ML-% IV SOLN
20.0000 mg | Freq: Once | INTRAVENOUS | Status: AC
  Administered 2023-06-23: 20 mg via INTRAVENOUS
  Filled 2023-06-23: qty 50

## 2023-06-23 NOTE — Progress Notes (Signed)
 Seen by Adolfo Hooker, NP 06/21/23 for contact dermatitis on her back and legs. Pharmacy hasn't filled her triamcinolone cream yet.   No appetite, no supplement drinks, forces her to eat once a day. Having n/v.  Not sleeping well. Would like something to help. Can't take melatonin.

## 2023-06-23 NOTE — Assessment & Plan Note (Addendum)
#   PET scan-January/February 2025- the treated right upper lobe nodule demonstrates no significant residual update [s/p SBRT 2024]. However new enlarging mediastinal and right hilar lymph nodes demonstrated on recent CT are hypermetabolic, consistent with metastatic disease.  No evidence of distant metastatic disease. MARCH 2025- SQUAMOUS CELL CA of LN Bx- [Dr.Aleskerov].  MARCH 2025- Recurrent-stage III squamous of carcinoma- APRIL- 2025-CarboTaxol RT-followed by durvalumab  # Proceed with cycle #2 of CarboTaxol weekly of planned 6-8 cycles. [with RT- 5/22]. Labs-CBC/chemistries were reviewed with the patient.  # Left LE rash: likely contact dermatitis vs early cellulitis- Recommend continue Bactrim TID/weekly [Dr.A; and ]topical triamcinolone if pruritic-  If symptoms worsen- then alternate antibiotics.     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   # Chronic respiratory failure/COPD on 2 lits [Dr.Fleming]- stable.  # Active smoker [since age of 6-8]- tried Chantix- failed multiple interventions.  Again encouraged to quit smoking.  # Multiple reactions at baseline- monitor closely   # IV access: Mediport placement functional  PS- # DISPOSITION: #  today chemo- cabo-taxol #  in 1 week-MD; port labs-CBC CMP carbo-taxol #  in 2 week MD; port labs-CBC CMP carbo-taxol # in 3 week-MD; port labs-CBC CMP carbo-taxol-Dr.B

## 2023-06-23 NOTE — Progress Notes (Signed)
  Cancer Center CONSULT NOTE  Patient Care Team: Inc, [facility] Services as PCP - General Glory Buff, RN as Oncology Nurse Navigator Earna Coder, MD as Consulting Physician (Oncology)  CHIEF COMPLAINTS/PURPOSE OF CONSULTATION:   Oncology History Overview Note  # MAY 2024- RUL presumed  [poor candidate for biopsy at the time] stage I NCSLC- s/p SBRT.     # PET scan-January/February 2025- the treated right upper lobe nodule demonstrates no significant residual update. However new enlarging mediastinal and right hilar lymph nodes demonstrated on recent CT are hypermetabolic, consistent with metastatic disease.  No evidence of distant metastatic disease. MARCH 2025- SQUAMOUS CELL CA of LN Bx- [Dr.Aleskerov]  # April 11th, 2025- Carbo-Taxol- RT   # Chronic Respiratory failure/COPD- on 2 lits home O2.     Primary cancer of left upper lobe of lung (HCC)  04/24/2023 Initial Diagnosis   Primary cancer of left upper lobe of lung (HCC)   04/24/2023 Cancer Staging   Staging form: Lung, AJCC V9 - Pathologic: Stage IIIA (pT1c, pN2b, cM0) - Signed by Earna Coder, MD on 05/24/2023   06/17/2023 -  Chemotherapy   Patient is on Treatment Plan : LUNG Carboplatin + Paclitaxel + XRT q7d     ' Lung cancer  HISTORY OF PRESENTING ILLNESS: Patient ambulating-independently.  Alone.  Brandi Fowler 58 y.o.  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  Patient underwent first cycle of treatment last week.  Tolerated well.  No reactions.  Noted to have rash on the left lower extremity.  Patient not used any topical steroids as recommended Tykerb.  Patient  continues to have chronic shortness of breath chronic cough.  Also unfortunately continues to smoke.    Review of Systems   Constitutional:  Positive for malaise/fatigue. Negative for chills, diaphoresis, fever and weight loss.  HENT:  Negative for nosebleeds and sore throat.   Eyes:  Negative for double vision.  Respiratory:  Positive for cough and shortness of breath. Negative for hemoptysis, sputum production and wheezing.   Cardiovascular:  Negative for chest pain, palpitations, orthopnea and leg swelling.  Gastrointestinal:  Negative for abdominal pain, blood in stool, constipation, diarrhea, heartburn, melena, nausea and vomiting.  Genitourinary:  Negative for dysuria, frequency and urgency.  Musculoskeletal:  Positive for back pain and joint pain.  Skin: Negative.  Negative for itching and rash.  Neurological:  Negative for dizziness, tingling, focal weakness, weakness and headaches.  Endo/Heme/Allergies:  Does not bruise/bleed easily.  Psychiatric/Behavioral:  Negative for depression. The patient is not nervous/anxious and does not have insomnia.     MEDICAL HISTORY:  Past Medical History:  Diagnosis Date   BRBPR (bright red blood per rectum)    CAP (community acquired pneumonia)    Chronic diarrhea    Chronic kidney disease    Chronic myofascial pain    Chronic pain    Chronic venous insufficiency    Cigarette smoker    COPD (chronic obstructive pulmonary disease) (HCC)    Coronary artery disease    DDD (degenerative disc disease), cervical    DDD (degenerative disc disease), thoracic    Depression    Elevated blood pressure reading without diagnosis of hypertension    Elevated hemoglobin A1c    Female hirsutism    GERD (gastroesophageal reflux disease)  Headache    migraines   History of adenomatous polyp of colon    History of kidney stones    History of sepsis    Kidney stones    Lymphedema    Mediastinal adenopathy    Morbid obesity (HCC)    Obesity, Class III, BMI 40-49.9 (morbid obesity) (HCC)    OSA (obstructive sleep apnea)    Paranoid schizophrenia (HCC)    Peripheral  vascular disease (HCC)    Primary cancer of left upper lobe of lung (HCC)    PTSD (post-traumatic stress disorder)    RLS (restless legs syndrome)    Seizures (HCC)    last seizure in Jan 2025   Sleep apnea    Supplemental oxygen dependent    2-3 L [place]   Syncope     SURGICAL HISTORY: Past Surgical History:  Procedure Laterality Date   CERVICAL SPINE SURGERY     Fusion C6-7   CHOLECYSTECTOMY     COLONOSCOPY WITH PROPOFOL N/A 11/18/2014   Procedure: COLONOSCOPY WITH PROPOFOL;  Surgeon: Cassie Click, MD;  Location: [facility] ENDOSCOPY;  Service: Endoscopy;  Laterality: N/A;   ESOPHAGOGASTRODUODENOSCOPY     FLEXIBLE BRONCHOSCOPY N/A 05/13/2023   Procedure: BRONCHOSCOPY, FLEXIBLE;  Surgeon: Erskin Hearing, MD;  Location: [facility] ORS;  Service: Thoracic;  Laterality: N/A;   IR IMAGING GUIDED PORT INSERTION  06/03/2023   KNEE SURGERY Left    polpectomy N/A    TUBAL LIGATION     VIDEO BRONCHOSCOPY WITH ENDOBRONCHIAL ULTRASOUND N/A 05/13/2023   Procedure: BRONCHOSCOPY, WITH EBUS;  Surgeon: Erskin Hearing, MD;  Location: [facility] ORS;  Service: Thoracic;  Laterality: N/A;    SOCIAL HISTORY: Social History   Socioeconomic History   Marital status: Single    Spouse name: Not on file   Number of children: Not on file   Years of education: Not on file   Highest education level: Not on file  Occupational History   Not on file  Tobacco Use   Smoking status: Every Day    Current packs/day: 0.25    Average packs/day: 0.3 packs/day for 45.0 years (11.3 ttl pk-yrs)    Types: Cigarettes   Smokeless tobacco: Former  Building services engineer status: Never Used  Substance and Sexual Activity   Alcohol use: Not Currently    Alcohol/week: 1.0 standard drink of alcohol    Types: 1 Standard drinks or equivalent per week   Drug use: No   Sexual activity: Yes  Other Topics Concern   Not on file  Social History Narrative   Not on file   Social Drivers of Health   Financial Resource Strain: Low Risk   (03/29/2023)   Received from [facility]   Overall Financial Resource Strain (CARDIA)    Difficulty of Paying Living Expenses: Not hard at all  Food Insecurity: No Food Insecurity (03/29/2023)   Received from [facility]   Hunger Vital Sign    Ran Out of Food in the Last Year: Never true    Worried About Running Out of Food in the Last Year: Never true  Transportation Needs: No Transportation Needs (03/29/2023)   Received from [facility]   PRAPARE - Transportation    Lack of Transportation (Non-Medical): No    In the past 12 months, has lack of transportation kept you from medical appointments or from getting medications?: No  Physical Activity: Insufficiently Active (08/31/2018)   Exercise Vital Sign    Days of  Exercise per Week: 7 days    Minutes of Exercise per Session: 10 min  Stress: Stress Concern Present (08/31/2018)   [employer] of Occupational Health - Occupational Stress Questionnaire    Feeling of Stress : Very much  Social Connections: Moderately Isolated (08/31/2018)   Social Connection and Isolation Panel [NHANES]    Frequency of Communication with Friends and Family: More than three times a week    Frequency of Social Gatherings with Friends and Family: Once a week    Attends Religious Services: Never    Database administrator or Organizations: No    Attends Banker Meetings: Never    Marital Status: Living with partner  Intimate Partner Violence: Not At Risk (05/07/2022)   Humiliation, Afraid, Rape, and Kick questionnaire    Fear of Current or Ex-Partner: No    Emotionally Abused: No    Physically Abused: No    Sexually Abused: No    FAMILY HISTORY: Family History  Problem Relation Age of Onset   Alcohol abuse Mother    Cancer Mother    COPD Mother    Hearing loss Mother    Vision loss Mother    Alcohol abuse Father    Depression Father    Early death Father    Mental illness Father      ALLERGIES:  is allergic to aripiprazole, baclofen, buprenorphine-naloxone, clonazepam, cyclobenzaprine, doxepin, fentanyl, lurasidone, methadone, oxymorphone, penicillins, pregabalin, quetiapine, tolmetin, trazodone, acetaminophen, bupropion, colestipol, gabapentin, hydrocodone, paroxetine, sertraline, amitriptyline hcl, carbamazepine, ciprofloxacin hcl, codeine, divalproex sodium, etodolac, haloperidol, hyaluronate sodium, hydrochlorothiazide, ibuprofen, indocin [indomethacin], iodinated contrast media, ketoprofen, latuda [lurasidone hcl], lidocaine, meloxicam, metformin and related, methocarbamol, nabumetone, naproxen, ondansetron, opana [oxymorphone hcl], oxaprozin, perphenazine, risperidone and related, seroquel [quetiapine fumarate], silver, suboxone [buprenorphine hcl-naloxone hcl], tramadol, valproic acid, zoloft [sertraline hcl], bromfenac, buprenorphine, ciprofloxacin, clindamycin, clindamycin/lincomycin, flurbiprofen, metronidazole, and mirtazapine.  MEDICATIONS:  Current Outpatient Medications  Medication Sig Dispense Refill   albuterol (ACCUNEB) 1.25 MG/3ML nebulizer solution Take 1 ampule by nebulization every 6 (six) hours as needed for wheezing.     albuterol (VENTOLIN HFA) 108 (90 Base) MCG/ACT inhaler Inhale 2 puffs into the lungs every 4 (four) hours as needed.     Benzocaine, Topical, 20 % OINT Apply topically to port site 1-2 hours prior to port access. 28 g 2   Ensifentrine (OHTUVAYRE) 3 MG/2.5ML SUSP Inhale 1 vial into the lungs in the morning and at bedtime.     EPINEPHrine 0.3 mg/0.3 mL IJ SOAJ injection Inject 0.3 mg into the muscle as needed for anaphylaxis.     naloxone (NARCAN) nasal spray 4 mg/0.1 mL CALL 911. INSTILL 1 SPRAY IN ONE NOSTRIL. MAY REPEAT Q 2 TO 3 MINUTES IF SYMPTOMS OF AN OPIOID EMERGENCY PERSISTS. ALTERNATE NOSTRILS.     oxyCODONE (ROXICODONE) 15 MG immediate release tablet Take 15 mg by mouth in the morning, at noon, in the evening, and at bedtime.      OXYGEN Inhale 2-3 L into the lungs continuous.     predniSONE (DELTASONE) 5 MG tablet Take 1 tablet by mouth daily with breakfast.     prochlorperazine (COMPAZINE) 10 MG tablet Take 1 tablet (10 mg total) by mouth every 6 (six) hours as needed for nausea or vomiting. 40 tablet 1   roflumilast (DALIRESP) 500 MCG TABS tablet Take 1 tablet by mouth every morning.     sulfamethoxazole-trimethoprim (BACTRIM) 400-80 MG tablet Take 1 tablet by mouth 3 (three) times a week.     TRELEGY  ELLIPTA 100-62.5-25 MCG/ACT AEPB Inhale 1 puff into the lungs every morning.     triamcinolone cream (KENALOG) 0.1 % Apply 1 Application topically 2 (two) times daily. 45 g 0   rOPINIRole (REQUIP) 0.5 MG tablet Take 1 tablet (0.5 mg total) by mouth at bedtime. (Patient taking differently: Take 1 mg by mouth at bedtime.) 30 tablet 11   No current facility-administered medications for this visit.   Facility-Administered Medications Ordered in Other Visits  Medication Dose Route Frequency Provider Last Rate Last Admin   0.9 %  sodium chloride infusion   Intravenous Continuous Raeden Belzer, Worthy Flank, MD       CARBOplatin (PARAPLATIN) 300 mg in sodium chloride 0.9 % 100 mL chemo infusion  300 mg Intravenous Once Earna Coder, MD       dexamethasone (DECADRON) injection 10 mg  10 mg Intravenous Once Earna Coder, MD       diphenhydrAMINE (BENADRYL) injection 50 mg  50 mg Intravenous Once Earna Coder, MD       famotidine (PEPCID) IVPB 20 mg premix  20 mg Intravenous Once Earna Coder, MD       PACLitaxel (TAXOL) 102 mg in sodium chloride 0.9 % 250 mL chemo infusion (</= 80mg /m2)  45 mg/m2 (Treatment Plan Recorded) Intravenous Once Earna Coder, MD       palonosetron (ALOXI) injection 0.25 mg  0.25 mg Intravenous Once Louretta Shorten R, MD        PHYSICAL EXAMINATION:   Vitals:   06/23/23 0829  BP: 112/66  Pulse: 99  Resp: (!) 24  Temp: 97.8 F (36.6 C)  SpO2: 97%       Filed Weights   06/23/23 0829  Weight: 247 lb 12.8 oz (112.4 kg)   Maculopapular rash noted on the left lower extremity-behind the shin  Physical Exam Vitals and nursing note reviewed.  HENT:     Head: Normocephalic and atraumatic.     Mouth/Throat:     Pharynx: Oropharynx is clear.  Eyes:     Extraocular Movements: Extraocular movements intact.     Pupils: Pupils are equal, round, and reactive to light.  Cardiovascular:     Rate and Rhythm: Normal rate and regular rhythm.  Pulmonary:     Comments: Decreased breath sounds bilaterally.  Abdominal:     Palpations: Abdomen is soft.  Musculoskeletal:        General: Normal range of motion.     Cervical back: Normal range of motion.  Skin:    General: Skin is warm.  Neurological:     General: No focal deficit present.     Mental Status: She is alert and oriented to person, place, and time.  Psychiatric:        Behavior: Behavior normal.        Judgment: Judgment normal.     LABORATORY DATA:  I have reviewed the data as listed Lab Results  Component Value Date   WBC 10.1 06/23/2023   HGB 12.7 06/23/2023   HCT 38.9 06/23/2023   MCV 96.8 06/23/2023   PLT 311 06/23/2023   Recent Labs    06/02/23 1200 06/17/23 0858 06/23/23 0811  NA 138 136 137  K 4.1 3.7 3.9  CL 100 103 103  CO2 25 23 25   GLUCOSE 94 117* 128*  BUN 13 16 19   CREATININE 0.84 0.80 0.83  CALCIUM 9.0 9.2 9.0  GFRNONAA >60 >60 >60  PROT 7.1 7.6 7.0  ALBUMIN 3.5 3.8 3.4*  AST 20 21 14*  ALT 24 20 17   ALKPHOS 83 90 73  BILITOT 0.4 0.3 0.4    RADIOGRAPHIC STUDIES: I have personally reviewed the radiological images as listed and agreed with the findings in the report. IR IMAGING GUIDED PORT INSERTION Result Date: 06/03/2023 CLINICAL DATA:  Right lung carcinoma and need for porta cath for chemotherapy. EXAM: IMPLANTED PORT A CATH PLACEMENT WITH ULTRASOUND AND FLUOROSCOPIC GUIDANCE ANESTHESIA/SEDATION: Full moderate conscious sedation was  not administered and the patient received 2 mg of IV Versed and 25 mg of IV Benadryl. FLUOROSCOPY: 30 seconds.  7.6 mGy. PROCEDURE: The procedure, risks, benefits, and alternatives were explained to the patient. Questions regarding the procedure were encouraged and answered. The patient understands and consents to the procedure. A time-out was performed prior to initiating the procedure. The right side was chosen for port placement after review of anatomy by prior imaging given the patient's body habitus as well as course of left-sided central veins which would have made advancement of a left-sided port catheter potentially difficult. Ultrasound was utilized to confirm patency of the right internal jugular vein. An ultrasound image was saved and recorded. The right neck and chest were prepped with chlorhexidine in a sterile fashion, and a sterile drape was applied covering the operative field. Maximum barrier sterile technique with sterile gowns and gloves were used for the procedure. Local anesthesia was provided with 1% lidocaine. After creating a small venotomy incision, a 21 gauge needle was advanced into the right internal jugular vein under direct, real-time ultrasound guidance. Ultrasound image documentation was performed. After securing guidewire access, an 8 Fr dilator was placed. A J-wire was kinked to measure appropriate catheter length. A subcutaneous port pocket was then created along the upper chest wall utilizing sharp and blunt dissection. Portable cautery was utilized. The pocket was irrigated with sterile saline. A single lumen power injectable port was chosen for placement. The 8 Fr catheter was tunneled from the port pocket site to the venotomy incision. The port was placed in the pocket. External catheter was trimmed to appropriate length based on guidewire measurement. At the venotomy, an 8 Fr peel-away sheath was placed over a guidewire. The catheter was then placed through the sheath and the  sheath removed. Final catheter positioning was confirmed and documented with a fluoroscopic spot image. The port was accessed with a needle and aspirated and flushed with heparinized saline. The access needle was removed. The venotomy and port pocket incisions were closed with subcutaneous 3-0 Monocryl and subcuticular 4-0 Vicryl. Dermabond was applied to both incisions. COMPLICATIONS: COMPLICATIONS None FINDINGS: After catheter placement, the tip lies at the cavo-atrial junction. The catheter aspirates normally and is ready for immediate use. IMPRESSION: Placement of single lumen port a cath via right internal jugular vein. The catheter tip lies at the cavo-atrial junction. A power injectable port a cath was placed and is ready for immediate use. Electronically Signed   By: Irish Lack M.D.   On: 06/03/2023 13:17    Primary cancer of left upper lobe of lung (HCC) # PET scan-January/February 2025- the treated right upper lobe nodule demonstrates no significant residual update [s/p SBRT 2024]. However new enlarging mediastinal and right hilar lymph nodes demonstrated on recent CT are hypermetabolic, consistent with metastatic disease.  No evidence of distant metastatic disease. MARCH 2025- SQUAMOUS CELL CA of LN Bx- [Dr.Aleskerov].  MARCH 2025- Recurrent-stage III squamous of carcinoma- APRIL- 2025-CarboTaxol RT-followed by durvalumab  # Proceed with cycle #2 of CarboTaxol weekly  of planned 6-8 cycles. [with RT- 5/22]. Labs-CBC/chemistries were reviewed with the patient.  # Left LE rash: likely contact dermatitis vs early cellulitis- Recommend continue Bactrim TID/weekly [Dr.A; and ]topical triamcinolone if pruritic-  If symptoms worsen- then alternate antibiotics.     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   # Chronic respiratory failure/COPD on 2 lits [Dr.Fleming]- stable.  # Active smoker [since age of 6-8]- tried Chantix- failed multiple interventions.  Again encouraged to quit smoking.  # Multiple  reactions at baseline- monitor closely   # IV access: Mediport placement functional  PS- # DISPOSITION: #  today chemo- cabo-taxol #  in 1 week-MD; port labs-CBC CMP carbo-taxol #  in 2 week MD; port labs-CBC CMP carbo-taxol # in 3 week-MD; port labs-CBC CMP carbo-taxol-Dr.B   Above plan of care was discussed with patient/family in detail.  My contact information was given to the patient/family.     Gwyn Leos, MD 06/23/2023 9:03 AM

## 2023-06-23 NOTE — Patient Instructions (Signed)
 [facility] - A DEPT OF [facility]  Discharge Instructions: Thank you for choosing [facility] to provide your oncology and hematology care.  If you have a lab appointment with the Cancer Center, please go directly to the Cancer Center and check in at the registration area.  Wear comfortable clothing and clothing appropriate for easy access to any Portacath or PICC line.   We strive to give you quality time with your provider. You may need to reschedule your appointment if you arrive late (15 or more minutes).  Arriving late affects you and other patients whose appointments are after yours.  Also, if you miss three or more appointments without notifying the office, you may be dismissed from the clinic at the provider's discretion.      For prescription refill requests, have your pharmacy contact our office and allow 72 hours for refills to be completed.    Today you received the following chemotherapy and/or immunotherapy agents Taxol & Carbo      To help prevent nausea and vomiting after your treatment, we encourage you to take your nausea medication as directed.  BELOW ARE SYMPTOMS THAT SHOULD BE REPORTED IMMEDIATELY: *FEVER GREATER THAN 100.4 F (38 C) OR HIGHER *CHILLS OR SWEATING *NAUSEA AND VOMITING THAT IS NOT CONTROLLED WITH YOUR NAUSEA MEDICATION *UNUSUAL SHORTNESS OF BREATH *UNUSUAL BRUISING OR BLEEDING *URINARY PROBLEMS (pain or burning when urinating, or frequent urination) *BOWEL PROBLEMS (unusual diarrhea, constipation, pain near the anus) TENDERNESS IN MOUTH AND THROAT WITH OR WITHOUT PRESENCE OF ULCERS (sore throat, sores in mouth, or a toothache) UNUSUAL RASH, SWELLING OR PAIN  UNUSUAL VAGINAL DISCHARGE OR ITCHING   Items with * indicate a potential emergency and should be followed up as soon as possible or go to the Emergency Department if any problems should occur.  Please show the CHEMOTHERAPY ALERT CARD or  IMMUNOTHERAPY ALERT CARD at check-in to the Emergency Department and triage nurse.  Should you have questions after your visit or need to cancel or reschedule your appointment, please contact [facility] - A DEPT OF [facility]  [phone] and follow the prompts.  Office hours are 8:00 a.m. to 4:30 p.m. Monday - Friday. Please note that voicemails left after 4:00 p.m. may not be returned until the following business day.  We are closed weekends and major holidays. You have access to a nurse at all times for urgent questions. Please call the main number to the clinic [phone] and follow the prompts.  For any non-urgent questions, you may also contact your provider using MyChart. We now offer e-Visits for anyone 56 and older to request care online for non-urgent symptoms. For details visit [email or web address].   Also download the MyChart app! Go to the app store, search "MyChart", open the app, select [place], and log in with your MyChart username and password.

## 2023-06-24 ENCOUNTER — Encounter: Payer: Self-pay | Admitting: *Deleted

## 2023-06-24 ENCOUNTER — Ambulatory Visit
Admission: RE | Admit: 2023-06-24 | Discharge: 2023-06-24 | Disposition: A | Discharge status: Home / Self Care | Source: Ambulatory Visit | Attending: Radiation Oncology | Admitting: Radiation Oncology

## 2023-06-24 ENCOUNTER — Other Ambulatory Visit: Payer: Self-pay

## 2023-06-24 DIAGNOSIS — Z5111 Encounter for antineoplastic chemotherapy: Secondary | ICD-10-CM | POA: Diagnosis not present

## 2023-06-24 LAB — RAD ONC ARIA SESSION SUMMARY
Course Elapsed Days: 10
Plan Fractions Treated to Date: 9
Plan Prescribed Dose Per Fraction: 2 Gy
Plan Total Fractions Prescribed: 33
Plan Total Prescribed Dose: 66 Gy
Reference Point Dosage Given to Date: 18 Gy
Reference Point Session Dosage Given: 2 Gy
Session Number: 9

## 2023-06-27 ENCOUNTER — Other Ambulatory Visit: Payer: Self-pay

## 2023-06-27 ENCOUNTER — Ambulatory Visit
Admission: RE | Admit: 2023-06-27 | Discharge: 2023-06-27 | Disposition: A | Discharge status: Home / Self Care | Source: Ambulatory Visit | Attending: Radiation Oncology | Admitting: Radiation Oncology

## 2023-06-27 ENCOUNTER — Other Ambulatory Visit: Payer: Self-pay | Admitting: Internal Medicine

## 2023-06-27 DIAGNOSIS — Z5111 Encounter for antineoplastic chemotherapy: Secondary | ICD-10-CM | POA: Diagnosis not present

## 2023-06-27 LAB — RAD ONC ARIA SESSION SUMMARY
Course Elapsed Days: 13
Plan Fractions Treated to Date: 10
Plan Prescribed Dose Per Fraction: 2 Gy
Plan Total Fractions Prescribed: 33
Plan Total Prescribed Dose: 66 Gy
Reference Point Dosage Given to Date: 20 Gy
Reference Point Session Dosage Given: 2 Gy
Session Number: 10

## 2023-06-28 ENCOUNTER — Ambulatory Visit
Admission: RE | Admit: 2023-06-28 | Discharge: 2023-06-28 | Disposition: A | Discharge status: Home / Self Care | Source: Ambulatory Visit | Attending: Radiation Oncology | Admitting: Radiation Oncology

## 2023-06-28 ENCOUNTER — Other Ambulatory Visit: Payer: Self-pay | Admitting: *Deleted

## 2023-06-28 ENCOUNTER — Other Ambulatory Visit: Payer: Self-pay

## 2023-06-28 DIAGNOSIS — Z5111 Encounter for antineoplastic chemotherapy: Secondary | ICD-10-CM | POA: Diagnosis not present

## 2023-06-28 LAB — RAD ONC ARIA SESSION SUMMARY
Course Elapsed Days: 14
Plan Fractions Treated to Date: 11
Plan Prescribed Dose Per Fraction: 2 Gy
Plan Total Fractions Prescribed: 33
Plan Total Prescribed Dose: 66 Gy
Reference Point Dosage Given to Date: 22 Gy
Reference Point Session Dosage Given: 2 Gy
Session Number: 11

## 2023-06-28 LAB — ACID FAST CULTURE WITH REFLEXED SENSITIVITIES (MYCOBACTERIA): Acid Fast Culture: NEGATIVE

## 2023-06-28 MED ORDER — FLUCONAZOLE 200 MG PO TABS: 200.0000 mg | ORAL_TABLET | Freq: Every day | ORAL | 0 refills | Status: AC

## 2023-06-28 NOTE — Addendum Note (Signed)
 Encounter addended by: Karolynn Pack on: 06/28/2023 8:29 AM  Actions taken: Imaging Exam ended

## 2023-06-29 ENCOUNTER — Other Ambulatory Visit: Payer: Self-pay

## 2023-06-29 ENCOUNTER — Ambulatory Visit
Admission: RE | Admit: 2023-06-29 | Discharge: 2023-06-29 | Disposition: A | Discharge status: Home / Self Care | Source: Ambulatory Visit | Attending: Radiation Oncology | Admitting: Radiation Oncology

## 2023-06-29 DIAGNOSIS — Z5111 Encounter for antineoplastic chemotherapy: Secondary | ICD-10-CM | POA: Diagnosis not present

## 2023-06-29 LAB — RAD ONC ARIA SESSION SUMMARY
Course Elapsed Days: 15
Plan Fractions Treated to Date: 12
Plan Prescribed Dose Per Fraction: 2 Gy
Plan Total Fractions Prescribed: 33
Plan Total Prescribed Dose: 66 Gy
Reference Point Dosage Given to Date: 24 Gy
Reference Point Session Dosage Given: 2 Gy
Session Number: 12

## 2023-06-30 ENCOUNTER — Encounter: Payer: Self-pay | Admitting: Internal Medicine

## 2023-06-30 ENCOUNTER — Encounter: Payer: Self-pay | Admitting: *Deleted

## 2023-06-30 ENCOUNTER — Inpatient Hospital Stay

## 2023-06-30 ENCOUNTER — Inpatient Hospital Stay (HOSPITAL_BASED_OUTPATIENT_CLINIC_OR_DEPARTMENT_OTHER): Admitting: Internal Medicine

## 2023-06-30 ENCOUNTER — Telehealth: Payer: Self-pay | Admitting: *Deleted

## 2023-06-30 ENCOUNTER — Other Ambulatory Visit: Payer: Self-pay

## 2023-06-30 ENCOUNTER — Ambulatory Visit
Admission: RE | Admit: 2023-06-30 | Discharge: 2023-06-30 | Disposition: A | Discharge status: Home / Self Care | Source: Ambulatory Visit | Attending: Radiation Oncology | Admitting: Radiation Oncology

## 2023-06-30 VITALS — BP 112/85 | HR 106 | Temp 97.7°F | Resp 20 | Ht 65.0 in | Wt 271.0 lb

## 2023-06-30 DIAGNOSIS — C3412 Malignant neoplasm of upper lobe, left bronchus or lung: Secondary | ICD-10-CM

## 2023-06-30 DIAGNOSIS — Z5111 Encounter for antineoplastic chemotherapy: Secondary | ICD-10-CM | POA: Diagnosis not present

## 2023-06-30 LAB — CBC WITH DIFFERENTIAL (CANCER CENTER ONLY)
Abs Immature Granulocytes: 0.04 10*3/uL (ref 0.00–0.07)
Basophils Absolute: 0.1 10*3/uL (ref 0.0–0.1)
Basophils Relative: 1 %
Eosinophils Absolute: 0.1 10*3/uL (ref 0.0–0.5)
Eosinophils Relative: 2 %
HCT: 39.7 % (ref 36.0–46.0)
Hemoglobin: 13 g/dL (ref 12.0–15.0)
Immature Granulocytes: 1 %
Lymphocytes Relative: 12 %
Lymphs Abs: 1 10*3/uL (ref 0.7–4.0)
MCH: 31.4 pg (ref 26.0–34.0)
MCHC: 32.7 g/dL (ref 30.0–36.0)
MCV: 95.9 fL (ref 80.0–100.0)
Monocytes Absolute: 0.6 10*3/uL (ref 0.1–1.0)
Monocytes Relative: 8 %
Neutro Abs: 6.6 10*3/uL (ref 1.7–7.7)
Neutrophils Relative %: 76 %
Platelet Count: 329 10*3/uL (ref 150–400)
RBC: 4.14 MIL/uL (ref 3.87–5.11)
RDW: 14 % (ref 11.5–15.5)
WBC Count: 8.5 10*3/uL (ref 4.0–10.5)
nRBC: 0 % (ref 0.0–0.2)

## 2023-06-30 LAB — CMP (CANCER CENTER ONLY)
ALT: 25 U/L (ref 0–44)
AST: 20 U/L (ref 15–41)
Albumin: 3.7 g/dL (ref 3.5–5.0)
Alkaline Phosphatase: 81 U/L (ref 38–126)
Anion gap: 9 (ref 5–15)
BUN: 17 mg/dL (ref 6–20)
CO2: 23 mmol/L (ref 22–32)
Calcium: 9.3 mg/dL (ref 8.9–10.3)
Chloride: 103 mmol/L (ref 98–111)
Creatinine: 0.76 mg/dL (ref 0.44–1.00)
GFR, Estimated: >60 mL/min (ref >60)
Glucose, Bld: 113 mg/dL — ABNORMAL HIGH (ref 70–99)
Potassium: 4.3 mmol/L (ref 3.5–5.1)
Sodium: 135 mmol/L (ref 135–145)
Total Bilirubin: 0.6 mg/dL (ref 0.0–1.2)
Total Protein: 7.7 g/dL (ref 6.5–8.1)

## 2023-06-30 LAB — RAD ONC ARIA SESSION SUMMARY
Course Elapsed Days: 16
Plan Fractions Treated to Date: 13
Plan Prescribed Dose Per Fraction: 2 Gy
Plan Total Fractions Prescribed: 33
Plan Total Prescribed Dose: 66 Gy
Reference Point Dosage Given to Date: 26 Gy
Reference Point Session Dosage Given: 2 Gy
Session Number: 13

## 2023-06-30 MED ORDER — SODIUM CHLORIDE 0.9 % IV SOLN
300.0000 mg | Freq: Once | INTRAVENOUS | Status: AC
  Administered 2023-06-30: 300 mg via INTRAVENOUS
  Filled 2023-06-30: qty 30

## 2023-06-30 MED ORDER — DIPHENHYDRAMINE HCL 50 MG/ML IJ SOLN
50.0000 mg | Freq: Once | INTRAMUSCULAR | Status: AC
  Administered 2023-06-30: 50 mg via INTRAVENOUS
  Filled 2023-06-30: qty 1

## 2023-06-30 MED ORDER — SODIUM CHLORIDE 0.9 % IV SOLN
45.0000 mg/m2 | Freq: Once | INTRAVENOUS | Status: AC
  Administered 2023-06-30: 102 mg via INTRAVENOUS
  Filled 2023-06-30: qty 17

## 2023-06-30 MED ORDER — HEPARIN SOD (PORK) LOCK FLUSH 100 UNIT/ML IV SOLN
500.0000 [IU] | Freq: Once | INTRAVENOUS | Status: AC | PRN
  Administered 2023-06-30: 500 [IU]
  Filled 2023-06-30: qty 5

## 2023-06-30 MED ORDER — DEXAMETHASONE SODIUM PHOSPHATE 10 MG/ML IJ SOLN
10.0000 mg | Freq: Once | INTRAMUSCULAR | Status: AC
  Administered 2023-06-30: 10 mg via INTRAVENOUS
  Filled 2023-06-30: qty 1

## 2023-06-30 MED ORDER — FAMOTIDINE IN NACL 20-0.9 MG/50ML-% IV SOLN
20.0000 mg | Freq: Once | INTRAVENOUS | Status: AC
Start: 2023-06-30 — End: 2023-06-30
  Administered 2023-06-30: 20 mg via INTRAVENOUS
  Filled 2023-06-30: qty 50

## 2023-06-30 MED ORDER — PALONOSETRON HCL INJECTION 0.25 MG/5ML
0.2500 mg | Freq: Once | INTRAVENOUS | Status: AC
Start: 2023-06-30 — End: 2023-06-30
  Administered 2023-06-30: 0.25 mg via INTRAVENOUS
  Filled 2023-06-30: qty 5

## 2023-06-30 MED ORDER — SODIUM CHLORIDE 0.9 % IV SOLN
INTRAVENOUS | Status: DC
  Filled 2023-06-30: qty 250

## 2023-06-30 NOTE — Patient Instructions (Signed)
 [facility] - A DEPT OF [facility]  Discharge Instructions: Thank you for choosing [facility] to provide your oncology and hematology care.  If you have a lab appointment with the Cancer Center, please go directly to the Cancer Center and check in at the registration area.  Wear comfortable clothing and clothing appropriate for easy access to any Portacath or PICC line.   We strive to give you quality time with your provider. You may need to reschedule your appointment if you arrive late (15 or more minutes).  Arriving late affects you and other patients whose appointments are after yours.  Also, if you miss three or more appointments without notifying the office, you may be dismissed from the clinic at the provider's discretion.      For prescription refill requests, have your pharmacy contact our office and allow 72 hours for refills to be completed.    Today you received the following chemotherapy and/or immunotherapy agents TAXOL and CARBOPLATIN       To help prevent nausea and vomiting after your treatment, we encourage you to take your nausea medication as directed.  BELOW ARE SYMPTOMS THAT SHOULD BE REPORTED IMMEDIATELY: *FEVER GREATER THAN 100.4 F (38 C) OR HIGHER *CHILLS OR SWEATING *NAUSEA AND VOMITING THAT IS NOT CONTROLLED WITH YOUR NAUSEA MEDICATION *UNUSUAL SHORTNESS OF BREATH *UNUSUAL BRUISING OR BLEEDING *URINARY PROBLEMS (pain or burning when urinating, or frequent urination) *BOWEL PROBLEMS (unusual diarrhea, constipation, pain near the anus) TENDERNESS IN MOUTH AND THROAT WITH OR WITHOUT PRESENCE OF ULCERS (sore throat, sores in mouth, or a toothache) UNUSUAL RASH, SWELLING OR PAIN  UNUSUAL VAGINAL DISCHARGE OR ITCHING   Items with * indicate a potential emergency and should be followed up as soon as possible or go to the Emergency Department if any problems should occur.  Please show the CHEMOTHERAPY ALERT CARD or  IMMUNOTHERAPY ALERT CARD at check-in to the Emergency Department and triage nurse.  Should you have questions after your visit or need to cancel or reschedule your appointment, please contact [facility] - A DEPT OF [facility]  [phone] and follow the prompts.  Office hours are 8:00 a.m. to 4:30 p.m. Monday - Friday. Please note that voicemails left after 4:00 p.m. may not be returned until the following business day.  We are closed weekends and major holidays. You have access to a nurse at all times for urgent questions. Please call the main number to the clinic [phone] and follow the prompts.  For any non-urgent questions, you may also contact your provider using MyChart. We now offer e-Visits for anyone 83 and older to request care online for non-urgent symptoms. For details visit [email or web address].   Also download the MyChart app! Go to the app store, search "MyChart", open the app, select [facility], and log in with your MyChart username and password.  Paclitaxel Injection What is this medication? PACLITAXEL (PAK li TAX el) treats some types of cancer. It works by slowing down the growth of cancer cells. This medicine may be used for other purposes; ask your health care provider or pharmacist if you have questions. COMMON BRAND NAME(S): Onxol, Taxol What should I tell my care team before I take this medication? They need to know if you have any of these conditions: Heart disease Liver disease Low white blood cell levels An unusual or allergic reaction to paclitaxel, other medications, foods, dyes, or preservatives  If you or your partner are pregnant or trying to get pregnant Breast-feeding How should I use this medication? This medication is injected into a vein. It is given by your care team in a hospital or clinic setting. Talk to your care team about the use of this medication in children. While it may be given to children for selected  conditions, precautions do apply. Overdosage: If you think you have taken too much of this medicine contact a poison control center or emergency room at once. NOTE: This medicine is only for you. Do not share this medicine with others. What if I miss a dose? Keep appointments for follow-up doses. It is important not to miss your dose. Call your care team if you are unable to keep an appointment. What may interact with this medication? Do not take this medication with any of the following: Live virus vaccines Other medications may affect the way this medication works. Talk with your care team about all of the medications you take. They may suggest changes to your treatment plan to lower the risk of side effects and to make sure your medications work as intended. This list may not describe all possible interactions. Give your health care provider a list of all the medicines, herbs, non-prescription drugs, or dietary supplements you use. Also tell them if you smoke, drink alcohol, or use illegal drugs. Some items may interact with your medicine. What should I watch for while using this medication? Your condition will be monitored carefully while you are receiving this medication. You may need blood work while taking this medication. This medication may make you feel generally unwell. This is not uncommon as chemotherapy can affect healthy cells as well as cancer cells. Report any side effects. Continue your course of treatment even though you feel ill unless your care team tells you to stop. This medication can cause serious allergic reactions. To reduce the risk, your care team may give you other medications to take before receiving this one. Be sure to follow the directions from your care team. This medication may increase your risk of getting an infection. Call your care team for advice if you get a fever, chills, sore throat, or other symptoms of a cold or flu. Do not treat yourself. Try to avoid  being around people who are sick. This medication may increase your risk to bruise or bleed. Call your care team if you notice any unusual bleeding. Be careful brushing or flossing your teeth or using a toothpick because you may get an infection or bleed more easily. If you have any dental work done, tell your dentist you are receiving this medication. Talk to your care team if you may be pregnant. Serious birth defects can occur if you take this medication during pregnancy. Talk to your care team before breastfeeding. Changes to your treatment plan may be needed. What side effects may I notice from receiving this medication? Side effects that you should report to your care team as soon as possible: Allergic reactions--skin rash, itching, hives, swelling of the face, lips, tongue, or throat Heart rhythm changes--fast or irregular heartbeat, dizziness, feeling faint or lightheaded, chest pain, trouble breathing Increase in blood pressure Infection--fever, chills, cough, sore throat, wounds that don't heal, pain or trouble when passing urine, general feeling of discomfort or being unwell Low blood pressure--dizziness, feeling faint or lightheaded, blurry vision Low red blood cell level--unusual weakness or fatigue, dizziness, headache, trouble breathing Painful swelling, warmth, or redness of the skin, blisters  or sores at the infusion site Pain, tingling, or numbness in the hands or feet Slow heartbeat--dizziness, feeling faint or lightheaded, confusion, trouble breathing, unusual weakness or fatigue Unusual bruising or bleeding Side effects that usually do not require medical attention (report to your care team if they continue or are bothersome): Diarrhea Hair loss Joint pain Loss of appetite Muscle pain Nausea Vomiting This list may not describe all possible side effects. Call your doctor for medical advice about side effects. You may report side effects to FDA at 1-800-FDA-1088. Where  should I keep my medication? This medication is given in a hospital or clinic. It will not be stored at home. NOTE: This sheet is a summary. It may not cover all possible information. If you have questions about this medicine, talk to your doctor, pharmacist, or health care provider.  2024 Elsevier/Gold Standard (2021-07-14 00:00:00)  Carboplatin Injection What is this medication? CARBOPLATIN (KAR boe pla tin) treats some types of cancer. It works by slowing down the growth of cancer cells. This medicine may be used for other purposes; ask your health care provider or pharmacist if you have questions. COMMON BRAND NAME(S): Paraplatin What should I tell my care team before I take this medication? They need to know if you have any of these conditions: Blood disorders Hearing problems Kidney disease Recent or ongoing radiation therapy An unusual or allergic reaction to carboplatin, cisplatin, other medications, foods, dyes, or preservatives Pregnant or trying to get pregnant Breast-feeding How should I use this medication? This medication is injected into a vein. It is given by your care team in a hospital or clinic setting. Talk to your care team about the use of this medication in children. Special care may be needed. Overdosage: If you think you have taken too much of this medicine contact a poison control center or emergency room at once. NOTE: This medicine is only for you. Do not share this medicine with others. What if I miss a dose? Keep appointments for follow-up doses. It is important not to miss your dose. Call your care team if you are unable to keep an appointment. What may interact with this medication? Medications for seizures Some antibiotics, such as amikacin, gentamicin, neomycin, streptomycin, tobramycin Vaccines This list may not describe all possible interactions. Give your health care provider a list of all the medicines, herbs, non-prescription drugs, or dietary  supplements you use. Also tell them if you smoke, drink alcohol, or use illegal drugs. Some items may interact with your medicine. What should I watch for while using this medication? Your condition will be monitored carefully while you are receiving this medication. You may need blood work while taking this medication. This medication may make you feel generally unwell. This is not uncommon, as chemotherapy can affect healthy cells as well as cancer cells. Report any side effects. Continue your course of treatment even though you feel ill unless your care team tells you to stop. In some cases, you may be given additional medications to help with side effects. Follow all directions for their use. This medication may increase your risk of getting an infection. Call your care team for advice if you get a fever, chills, sore throat, or other symptoms of a cold or flu. Do not treat yourself. Try to avoid being around people who are sick. Avoid taking medications that contain aspirin, acetaminophen, ibuprofen, naproxen, or ketoprofen unless instructed by your care team. These medications may hide a fever. Be careful brushing or flossing your  teeth or using a toothpick because you may get an infection or bleed more easily. If you have any dental work done, tell your dentist you are receiving this medication. Talk to your care team if you wish to become pregnant or think you might be pregnant. This medication can cause serious birth defects. Talk to your care team about effective forms of contraception. Do not breast-feed while taking this medication. What side effects may I notice from receiving this medication? Side effects that you should report to your care team as soon as possible: Allergic reactions--skin rash, itching, hives, swelling of the face, lips, tongue, or throat Infection--fever, chills, cough, sore throat, wounds that don't heal, pain or trouble when passing urine, general feeling of  discomfort or being unwell Low red blood cell level--unusual weakness or fatigue, dizziness, headache, trouble breathing Pain, tingling, or numbness in the hands or feet, muscle weakness, change in vision, confusion or trouble speaking, loss of balance or coordination, trouble walking, seizures Unusual bruising or bleeding Side effects that usually do not require medical attention (report to your care team if they continue or are bothersome): Hair loss Nausea Unusual weakness or fatigue Vomiting This list may not describe all possible side effects. Call your doctor for medical advice about side effects. You may report side effects to FDA at 1-800-FDA-1088. Where should I keep my medication? This medication is given in a hospital or clinic. It will not be stored at home. NOTE: This sheet is a summary. It may not cover all possible information. If you have questions about this medicine, talk to your doctor, pharmacist, or health care provider.  2024 Elsevier/Gold Standard (2021-06-16 00:00:00)

## 2023-06-30 NOTE — Assessment & Plan Note (Addendum)
#   PET scan-January/February 2025- the treated right upper lobe nodule demonstrates no significant residual update [s/p SBRT 2024]. However new enlarging mediastinal and right hilar lymph nodes demonstrated on recent CT are hypermetabolic, consistent with metastatic disease.  No evidence of distant metastatic disease. MARCH 2025- SQUAMOUS CELL CA of LN Bx- [Dr.Aleskerov].  MARCH 2025- Recurrent-stage III squamous of carcinoma- APRIL- 2025-CarboTaxol RT-followed by durvalumab  # Proceed with cycle # 3 of CarboTaxol weekly of planned 6-8 cycles. [with RT- 5/22]. Labs-CBC/chemistries were reviewed with the patient.  # Left LE rash: likely contact dermatitis vs early cellulitis- Recommend continue Bactrim TID/weekly [Dr.A; and ]topical triamcinolone  if pruritic-  improved- monitor for now.    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   # Chronic respiratory failure/COPD on 2 lits [Dr.Fleming]- stable.   # Active smoker [since age of 6-8]- tried Chantix- failed multiple interventions.  Again encouraged to quit smoking.  # Multiple reactions at baseline- monitor closely     # IV access: Mediport placement functional  PS- # DISPOSITION: #  today chemo- cabo-taxol  #  in 1 week-MD; port labs-CBC CMP carbo-taxol  #  in 2 week MD; port labs-CBC CMP carbo-taxol  # in 3 week-MD; port labs-CBC CMP carbo-taxol -Dr.B

## 2023-06-30 NOTE — Progress Notes (Signed)
 Had one fall this past week. She states her legs/feet give out. No injuries.  C/o chest pain that comes in goes center of chest.

## 2023-06-30 NOTE — Progress Notes (Signed)
 Met with patient during follow up visit with Dr. Valentine Gasmen. Pt is concerned about her transportation and has requested an extension with [employer]. Pt states that transportation service should be in contact to verify/confirm ongoing treatments. Informed that Oletta Berry, SW will be meeting with her as well to address her transportation concerns. All questions answered during visit. Instructed pt to call with any further questions or needs. Pt verbalized understanding.

## 2023-06-30 NOTE — Progress Notes (Signed)
 [facility] CONSULT NOTE  Patient Care Team: Inc, [facility] as PCP - General Drake Gens, RN as Oncology Nurse Navigator Gwyn Leos, MD as Consulting Physician (Oncology)  CHIEF COMPLAINTS/PURPOSE OF CONSULTATION:   Oncology History Overview Note  # MAY 2024- RUL presumed  [poor candidate for biopsy at the time] stage I NCSLC- s/p SBRT.     # PET scan-January/February 2025- the treated right upper lobe nodule demonstrates no significant residual update. However new enlarging mediastinal and right hilar lymph nodes demonstrated on recent CT are hypermetabolic, consistent with metastatic disease.  No evidence of distant metastatic disease. MARCH 2025- SQUAMOUS CELL CA of LN Bx- [Dr.Aleskerov]  # April 11th, 2025- Carbo-Taxol - RT   # Chronic Respiratory failure/COPD- on 2 lits home O2.     Primary cancer of left upper lobe of lung (HCC)  04/24/2023 Initial Diagnosis   Primary cancer of left upper lobe of lung (HCC)   04/24/2023 Cancer Staging   Staging form: Lung, AJCC V9 - Pathologic: Stage IIIA (pT1c, pN2b, cM0) - Signed by Gwyn Leos, MD on 05/24/2023   06/17/2023 -  Chemotherapy   Patient is on Treatment Plan : LUNG Carboplatin  + Paclitaxel  + XRT q7d     ' Lung cancer  HISTORY OF PRESENTING ILLNESS: Patient ambulating-independently.  Alone.  Brandi Fowler 58 y.o.  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  Patient had a recent fall. However, history of falls.  She states her legs/feet give out. No injuries.   C/o chest pain that comes in goes center of chest. Intermittent; pleuritic.    Patient  continues to have chronic shortness of breath chronic cough.  Also unfortunately continues to smoke.    Review of Systems   Constitutional:  Positive for malaise/fatigue. Negative for chills, diaphoresis, fever and weight loss.  HENT:  Negative for nosebleeds and sore throat.   Eyes:  Negative for double vision.  Respiratory:  Positive for cough and shortness of breath. Negative for hemoptysis, sputum production and wheezing.   Cardiovascular:  Negative for chest pain, palpitations, orthopnea and leg swelling.  Gastrointestinal:  Negative for abdominal pain, blood in stool, constipation, diarrhea, heartburn, melena, nausea and vomiting.  Genitourinary:  Negative for dysuria, frequency and urgency.  Musculoskeletal:  Positive for back pain and joint pain.  Skin: Negative.  Negative for itching and rash.  Neurological:  Negative for dizziness, tingling, focal weakness, weakness and headaches.  Endo/Heme/Allergies:  Does not bruise/bleed easily.  Psychiatric/Behavioral:  Negative for depression. The patient is not nervous/anxious and does not have insomnia.     MEDICAL HISTORY:  Past Medical History:  Diagnosis Date   BRBPR (bright red blood per rectum)    CAP (community acquired pneumonia)    Chronic diarrhea    Chronic kidney disease    Chronic myofascial pain    Chronic pain    Chronic venous insufficiency    Cigarette smoker    COPD (chronic obstructive pulmonary disease) (HCC)    Coronary artery disease    DDD (degenerative disc disease), cervical    DDD (degenerative disc disease), thoracic    Depression    Elevated blood pressure reading without diagnosis of hypertension    Elevated hemoglobin A1c    Female hirsutism    GERD (gastroesophageal reflux disease)  Headache    migraines   History of adenomatous polyp of colon    History of kidney stones    History of sepsis    Kidney stones    Lymphedema    Mediastinal adenopathy    Morbid obesity (HCC)    Obesity, Class III, BMI 40-49.9 (morbid obesity) (HCC)    OSA (obstructive sleep apnea)    Paranoid schizophrenia (HCC)    Peripheral  vascular disease (HCC)    Primary cancer of left upper lobe of lung (HCC)    PTSD (post-traumatic stress disorder)    RLS (restless legs syndrome)    Seizures (HCC)    last seizure in Jan 2025   Sleep apnea    Supplemental oxygen  dependent    2-3 L [place]   Syncope     SURGICAL HISTORY: Past Surgical History:  Procedure Laterality Date   CERVICAL SPINE SURGERY     Fusion C6-7   CHOLECYSTECTOMY     COLONOSCOPY WITH PROPOFOL  N/A 11/18/2014   Procedure: COLONOSCOPY WITH PROPOFOL ;  Surgeon: Cassie Click, MD;  Location: [facility] ENDOSCOPY;  Service: Endoscopy;  Laterality: N/A;   ESOPHAGOGASTRODUODENOSCOPY     FLEXIBLE BRONCHOSCOPY N/A 05/13/2023   Procedure: BRONCHOSCOPY, FLEXIBLE;  Surgeon: Erskin Hearing, MD;  Location: [facility] ORS;  Service: Thoracic;  Laterality: N/A;   IR IMAGING GUIDED PORT INSERTION  06/03/2023   KNEE SURGERY Left    polpectomy N/A    TUBAL LIGATION     VIDEO BRONCHOSCOPY WITH ENDOBRONCHIAL ULTRASOUND N/A 05/13/2023   Procedure: BRONCHOSCOPY, WITH EBUS;  Surgeon: Erskin Hearing, MD;  Location: [facility] ORS;  Service: Thoracic;  Laterality: N/A;    SOCIAL HISTORY: Social History   Socioeconomic History   Marital status: Single    Spouse name: Not on file   Number of children: Not on file   Years of education: Not on file   Highest education level: Not on file  Occupational History   Not on file  Tobacco Use   Smoking status: Every Day    Current packs/day: 0.25    Average packs/day: 0.3 packs/day for 45.0 years (11.3 ttl pk-yrs)    Types: Cigarettes   Smokeless tobacco: Former  Building services engineer status: Never Used  Substance and Sexual Activity   Alcohol use: Not Currently    Alcohol/week: 1.0 standard drink of alcohol    Types: 1 Standard drinks or equivalent per week   Drug use: No   Sexual activity: Yes  Other Topics Concern   Not on file  Social History Narrative   Not on file   Social Drivers of Health   Financial Resource Strain: Low Risk   (03/29/2023)   Received from [facility]   Overall Financial Resource Strain (CARDIA)    Difficulty of Paying Living Expenses: Not hard at all  Food Insecurity: No Food Insecurity (03/29/2023)   Received from [facility]   Hunger Vital Sign    Ran Out of Food in the Last Year: Never true    Worried About Running Out of Food in the Last Year: Never true  Transportation Needs: No Transportation Needs (03/29/2023)   Received from [facility]   PRAPARE - Transportation    Lack of Transportation (Non-Medical): No    In the past 12 months, has lack of transportation kept you from medical appointments or from getting medications?: No  Physical Activity: Insufficiently Active (08/31/2018)   Exercise Vital Sign    Days of  Exercise per Week: 7 days    Minutes of Exercise per Session: 10 min  Stress: Stress Concern Present (08/31/2018)   [employer] of Occupational Health - Occupational Stress Questionnaire    Feeling of Stress : Very much  Social Connections: Moderately Isolated (08/31/2018)   Social Connection and Isolation Panel [NHANES]    Frequency of Communication with Friends and Family: More than three times a week    Frequency of Social Gatherings with Friends and Family: Once a week    Attends Religious Services: Never    Database administrator or Organizations: No    Attends Banker Meetings: Never    Marital Status: Living with partner  Intimate Partner Violence: Not At Risk (05/07/2022)   Humiliation, Afraid, Rape, and Kick questionnaire    Fear of Current or Ex-Partner: No    Emotionally Abused: No    Physically Abused: No    Sexually Abused: No    FAMILY HISTORY: Family History  Problem Relation Age of Onset   Alcohol abuse Mother    Cancer Mother    COPD Mother    Hearing loss Mother    Vision loss Mother    Alcohol abuse Father    Depression Father    Early death Father    Mental illness Father      ALLERGIES:  is allergic to aripiprazole, baclofen, buprenorphine-naloxone , clonazepam, cyclobenzaprine, doxepin, fentanyl , lurasidone, methadone, oxymorphone, penicillins, pregabalin , quetiapine, tolmetin, trazodone , acetaminophen , bupropion, colestipol, gabapentin, hydrocodone, paroxetine, sertraline, amitriptyline hcl, carbamazepine, ciprofloxacin hcl, codeine, divalproex  sodium, etodolac, haloperidol , hyaluronate sodium, hydrochlorothiazide, ibuprofen, indocin [indomethacin], iodinated contrast media, ketoprofen, latuda [lurasidone hcl], lidocaine , meloxicam, metformin and related, methocarbamol , nabumetone, naproxen, ondansetron , opana [oxymorphone hcl], oxaprozin, perphenazine , risperidone  and related, seroquel [quetiapine fumarate], silver, suboxone [buprenorphine hcl-naloxone  hcl], tramadol, valproic acid, zoloft [sertraline hcl], bromfenac, buprenorphine, ciprofloxacin, clindamycin, clindamycin/lincomycin, flurbiprofen, metronidazole, and mirtazapine.  MEDICATIONS:  Current Outpatient Medications  Medication Sig Dispense Refill   albuterol  (ACCUNEB ) 1.25 MG/3ML nebulizer solution Take 1 ampule by nebulization every 6 (six) hours as needed for wheezing.     albuterol  (VENTOLIN  HFA) 108 (90 Base) MCG/ACT inhaler Inhale 2 puffs into the lungs every 4 (four) hours as needed.     Benzocaine , Topical, 20 % OINT Apply topically to port site 1-2 hours prior to port access. 28 g 2   Ensifentrine  (OHTUVAYRE ) 3 MG/2.5ML SUSP Inhale 1 vial into the lungs in the morning and at bedtime.     EPINEPHrine 0.3 mg/0.3 mL IJ SOAJ injection Inject 0.3 mg into the muscle as needed for anaphylaxis.     fluconazole  (DIFLUCAN ) 200 MG tablet Take 1 tablet (200 mg total) by mouth daily for 10 days. 10 tablet 0   naloxone  (NARCAN ) nasal spray 4 mg/0.1 mL CALL 911. INSTILL 1 SPRAY IN ONE NOSTRIL. MAY REPEAT Q 2 TO 3 MINUTES IF SYMPTOMS OF AN OPIOID EMERGENCY PERSISTS. ALTERNATE NOSTRILS.     oxyCODONE  (ROXICODONE ) 15  MG immediate release tablet Take 15 mg by mouth in the morning, at noon, in the evening, and at bedtime.     OXYGEN  Inhale 2-3 L into the lungs continuous.     predniSONE  (DELTASONE ) 5 MG tablet Take 1 tablet by mouth daily with breakfast.     prochlorperazine  (COMPAZINE ) 10 MG tablet Take 1 tablet (10 mg total) by mouth every 6 (six) hours as needed for nausea or vomiting. 40 tablet 1   roflumilast (DALIRESP) 500 MCG TABS tablet Take 1 tablet by mouth every morning.  sulfamethoxazole-trimethoprim (BACTRIM) 400-80 MG tablet Take 1 tablet by mouth 3 (three) times a week.     tiZANidine  (ZANAFLEX ) 4 MG tablet Take 4 mg by mouth every 6 (six) hours as needed for muscle spasms.     TRELEGY ELLIPTA 100-62.5-25 MCG/ACT AEPB Inhale 1 puff into the lungs every morning.     rOPINIRole  (REQUIP ) 0.5 MG tablet Take 1 tablet (0.5 mg total) by mouth at bedtime. (Patient taking differently: Take 1 mg by mouth at bedtime.) 30 tablet 11   triamcinolone  cream (KENALOG ) 0.1 % Apply 1 Application topically 2 (two) times daily. (Patient not taking: Reported on 06/30/2023) 45 g 0   No current facility-administered medications for this visit.   Facility-Administered Medications Ordered in Other Visits  Medication Dose Route Frequency Provider Last Rate Last Admin   0.9 %  sodium chloride  infusion   Intravenous Continuous Maricus Tanzi R, MD 10 mL/hr at 06/30/23 1007 New Bag at 06/30/23 1007   CARBOplatin  (PARAPLATIN ) 300 mg in sodium chloride  0.9 % 100 mL chemo infusion  300 mg Intravenous Once Willies Laviolette R, MD       dexamethasone  (DECADRON ) injection 10 mg  10 mg Intravenous Once Renell Coaxum R, MD       diphenhydrAMINE  (BENADRYL ) injection 50 mg  50 mg Intravenous Once Angelle Isais R, MD       famotidine  (PEPCID ) IVPB 20 mg premix  20 mg Intravenous Once Thadd Apuzzo R, MD 200 mL/hr at 06/30/23 1008 20 mg at 06/30/23 1008   heparin  lock flush 100 unit/mL  500 Units Intracatheter  Once PRN Loucinda Croy R, MD       PACLitaxel  (TAXOL ) 102 mg in sodium chloride  0.9 % 250 mL chemo infusion (</= 80mg /m2)  45 mg/m2 (Treatment Plan Recorded) Intravenous Once Mai Longnecker R, MD       palonosetron  (ALOXI ) injection 0.25 mg  0.25 mg Intravenous Once Aitan Rossbach R, MD        PHYSICAL EXAMINATION:   Vitals:   06/30/23 0853  BP: 112/85  Pulse: (!) 106  Resp: 20  Temp: 97.7 F (36.5 C)  SpO2: 94%      Filed Weights   06/30/23 0853  Weight: 271 lb (122.9 kg)   Maculopapular rash noted on the left lower extremity-behind the shin  Physical Exam Vitals and nursing note reviewed.  HENT:     Head: Normocephalic and atraumatic.     Mouth/Throat:     Pharynx: Oropharynx is clear.  Eyes:     Extraocular Movements: Extraocular movements intact.     Pupils: Pupils are equal, round, and reactive to light.  Cardiovascular:     Rate and Rhythm: Normal rate and regular rhythm.  Pulmonary:     Comments: Decreased breath sounds bilaterally.  Abdominal:     Palpations: Abdomen is soft.  Musculoskeletal:        General: Normal range of motion.     Cervical back: Normal range of motion.  Skin:    General: Skin is warm.  Neurological:     General: No focal deficit present.     Mental Status: She is alert and oriented to person, place, and time.  Psychiatric:        Behavior: Behavior normal.        Judgment: Judgment normal.     LABORATORY DATA:  I have reviewed the data as listed Lab Results  Component Value Date   WBC 8.5 06/30/2023   HGB 13.0 06/30/2023   HCT 39.7  06/30/2023   MCV 95.9 06/30/2023   PLT 329 06/30/2023   Recent Labs    06/17/23 0858 06/23/23 0811 06/30/23 0840  NA 136 137 135  K 3.7 3.9 4.3  CL 103 103 103  CO2 23 25 23   GLUCOSE 117* 128* 113*  BUN 16 19 17   CREATININE 0.80 0.83 0.76  CALCIUM 9.2 9.0 9.3  GFRNONAA >60 >60 >60  PROT 7.6 7.0 7.7  ALBUMIN 3.8 3.4* 3.7  AST 21 14* 20  ALT 20 17 25   ALKPHOS 90  73 81  BILITOT 0.3 0.4 0.6    RADIOGRAPHIC STUDIES: I have personally reviewed the radiological images as listed and agreed with the findings in the report. IR IMAGING GUIDED PORT INSERTION Result Date: 06/03/2023 CLINICAL DATA:  Right lung carcinoma and need for porta cath for chemotherapy. EXAM: IMPLANTED PORT A CATH PLACEMENT WITH ULTRASOUND AND FLUOROSCOPIC GUIDANCE ANESTHESIA/SEDATION: Full moderate conscious sedation was not administered and the patient received 2 mg of IV Versed  and 25 mg of IV Benadryl . FLUOROSCOPY: 30 seconds.  7.6 mGy. PROCEDURE: The procedure, risks, benefits, and alternatives were explained to the patient. Questions regarding the procedure were encouraged and answered. The patient understands and consents to the procedure. A time-out was performed prior to initiating the procedure. The right side was chosen for port placement after review of anatomy by prior imaging given the patient's body habitus as well as course of left-sided central veins which would have made advancement of a left-sided port catheter potentially difficult. Ultrasound was utilized to confirm patency of the right internal jugular vein. An ultrasound image was saved and recorded. The right neck and chest were prepped with chlorhexidine  in a sterile fashion, and a sterile drape was applied covering the operative field. Maximum barrier sterile technique with sterile gowns and gloves were used for the procedure. Local anesthesia was provided with 1% lidocaine . After creating a small venotomy incision, a 21 gauge needle was advanced into the right internal jugular vein under direct, real-time ultrasound guidance. Ultrasound image documentation was performed. After securing guidewire access, an 8 Fr dilator was placed. A J-wire was kinked to measure appropriate catheter length. A subcutaneous port pocket was then created along the upper chest wall utilizing sharp and blunt dissection. Portable cautery was utilized.  The pocket was irrigated with sterile saline. A single lumen power injectable port was chosen for placement. The 8 Fr catheter was tunneled from the port pocket site to the venotomy incision. The port was placed in the pocket. External catheter was trimmed to appropriate length based on guidewire measurement. At the venotomy, an 8 Fr peel-away sheath was placed over a guidewire. The catheter was then placed through the sheath and the sheath removed. Final catheter positioning was confirmed and documented with a fluoroscopic spot image. The port was accessed with a needle and aspirated and flushed with heparinized saline. The access needle was removed. The venotomy and port pocket incisions were closed with subcutaneous 3-0 Monocryl and subcuticular 4-0 Vicryl. Dermabond was applied to both incisions. COMPLICATIONS: COMPLICATIONS None FINDINGS: After catheter placement, the tip lies at the cavo-atrial junction. The catheter aspirates normally and is ready for immediate use. IMPRESSION: Placement of single lumen port a cath via right internal jugular vein. The catheter tip lies at the cavo-atrial junction. A power injectable port a cath was placed and is ready for immediate use. Electronically Signed   By: Erica Hau M.D.   On: 06/03/2023 13:17    Primary cancer of left  upper lobe of lung (HCC) # PET scan-January/February 2025- the treated right upper lobe nodule demonstrates no significant residual update [s/p SBRT 2024]. However new enlarging mediastinal and right hilar lymph nodes demonstrated on recent CT are hypermetabolic, consistent with metastatic disease.  No evidence of distant metastatic disease. MARCH 2025- SQUAMOUS CELL CA of LN Bx- [Dr.Aleskerov].  MARCH 2025- Recurrent-stage III squamous of carcinoma- APRIL- 2025-CarboTaxol RT-followed by durvalumab  # Proceed with cycle # 3 of CarboTaxol weekly of planned 6-8 cycles. [with RT- 5/22]. Labs-CBC/chemistries were reviewed with the patient.  #  Left LE rash: likely contact dermatitis vs early cellulitis- Recommend continue Bactrim TID/weekly [Dr.A; and ]topical triamcinolone  if pruritic-  improved- monitor for now.    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   # Chronic respiratory failure/COPD on 2 lits [Dr.Fleming]- stable.   # Active smoker [since age of 6-8]- tried Chantix- failed multiple interventions.  Again encouraged to quit smoking.  # Multiple reactions at baseline- monitor closely     # IV access: Mediport placement functional  PS- # DISPOSITION: #  today chemo- cabo-taxol  #  in 1 week-MD; port labs-CBC CMP carbo-taxol  #  in 2 week MD; port labs-CBC CMP carbo-taxol  # in 3 week-MD; port labs-CBC CMP carbo-taxol -Dr.B   Above plan of care was discussed with patient/family in detail.  My contact information was given to the patient/family.     Gwyn Leos, MD 06/30/2023 10:09 AM

## 2023-06-30 NOTE — Progress Notes (Signed)
 [facility] CSW Progress Note  Visual merchandiser met with patient in infusion to discuss transportation issues.  She stated she has used almost all of her [facility] transportation benefits.  CSW contacted the number on her Medicaid card and verified that she has transportation benefits.  She can use Non-Emergent [employer] at [phone].  Provided patient with the information.    Kennth Peal, LCSW Clinical Social Worker [facility]    Patient is participating in a Managed Medicaid Plan:  Yes

## 2023-06-30 NOTE — Telephone Encounter (Signed)
 She called and said she needs help about transportation. She has 72 hours to get Dr. Valentine Gasmen  to speak to the insurance to see if she can get more appts for her cancer. She is here now and team knows about this issue

## 2023-07-01 ENCOUNTER — Ambulatory Visit
Admission: RE | Admit: 2023-07-01 | Discharge: 2023-07-01 | Disposition: A | Discharge status: Home / Self Care | Source: Ambulatory Visit | Attending: Radiation Oncology | Admitting: Radiation Oncology

## 2023-07-01 ENCOUNTER — Other Ambulatory Visit: Payer: Self-pay

## 2023-07-01 DIAGNOSIS — Z5111 Encounter for antineoplastic chemotherapy: Secondary | ICD-10-CM | POA: Diagnosis not present

## 2023-07-01 LAB — RAD ONC ARIA SESSION SUMMARY
Course Elapsed Days: 17
Plan Fractions Treated to Date: 14
Plan Prescribed Dose Per Fraction: 2 Gy
Plan Total Fractions Prescribed: 33
Plan Total Prescribed Dose: 66 Gy
Reference Point Dosage Given to Date: 28 Gy
Reference Point Session Dosage Given: 2 Gy
Session Number: 14

## 2023-07-04 ENCOUNTER — Other Ambulatory Visit: Payer: Self-pay

## 2023-07-04 ENCOUNTER — Ambulatory Visit
Admission: RE | Admit: 2023-07-04 | Discharge: 2023-07-04 | Disposition: A | Discharge status: Home / Self Care | Source: Ambulatory Visit | Attending: Radiation Oncology | Admitting: Radiation Oncology

## 2023-07-04 ENCOUNTER — Inpatient Hospital Stay

## 2023-07-04 DIAGNOSIS — Z5111 Encounter for antineoplastic chemotherapy: Secondary | ICD-10-CM | POA: Diagnosis not present

## 2023-07-04 LAB — RAD ONC ARIA SESSION SUMMARY
Course Elapsed Days: 20
Plan Fractions Treated to Date: 15
Plan Prescribed Dose Per Fraction: 2 Gy
Plan Total Fractions Prescribed: 33
Plan Total Prescribed Dose: 66 Gy
Reference Point Dosage Given to Date: 30 Gy
Reference Point Session Dosage Given: 2 Gy
Session Number: 15

## 2023-07-04 NOTE — Progress Notes (Signed)
 [facility] CSW Progress Note  Clinical Child psychotherapist contacted patient by phone to follow up regarding transportation and financial strain.  Patient stated she contacted the [facility] Non-Emergent Transportation number and she does qualify.  She stated the reservation has to be made several days in advance and that the window for pick up is several hours.  Her brother was able to transport her today for her radiation treatment.  She reports taking things a day at a time.  She is waiting to hear back about the [facility].  She reports this would be helpful in enabling for her to pay for transportation.    Kennth Peal, LCSW Clinical Social Worker [facility]    Patient is participating in a Managed Medicaid Plan:  Yes

## 2023-07-05 ENCOUNTER — Ambulatory Visit

## 2023-07-06 ENCOUNTER — Ambulatory Visit
Admission: RE | Admit: 2023-07-06 | Discharge: 2023-07-06 | Disposition: A | Discharge status: Home / Self Care | Source: Ambulatory Visit | Attending: Radiation Oncology | Admitting: Radiation Oncology

## 2023-07-06 ENCOUNTER — Other Ambulatory Visit: Payer: Self-pay

## 2023-07-06 ENCOUNTER — Telehealth: Payer: Self-pay | Admitting: Pharmacy Technician

## 2023-07-06 DIAGNOSIS — Z5111 Encounter for antineoplastic chemotherapy: Secondary | ICD-10-CM | POA: Diagnosis not present

## 2023-07-06 LAB — RAD ONC ARIA SESSION SUMMARY
Course Elapsed Days: 22
Plan Fractions Treated to Date: 16
Plan Prescribed Dose Per Fraction: 2 Gy
Plan Total Fractions Prescribed: 33
Plan Total Prescribed Dose: 66 Gy
Reference Point Dosage Given to Date: 32 Gy
Reference Point Session Dosage Given: 2 Gy
Session Number: 16

## 2023-07-06 NOTE — Telephone Encounter (Signed)
 Contacted patient and made aware that she was approved for the [employer].  Explained to patient what expenses were acceptable to be used for the grant.  Made patient aware that gas cards could be provided when she receives treatment.  Also, provided patient with contact information for [employer] to reach out when transportation is needed to and from hospital and provider appointments and to pick-up medications.  Penny Boxer Patient Services Navigator [facility]

## 2023-07-07 ENCOUNTER — Inpatient Hospital Stay (HOSPITAL_BASED_OUTPATIENT_CLINIC_OR_DEPARTMENT_OTHER): Admitting: Internal Medicine

## 2023-07-07 ENCOUNTER — Other Ambulatory Visit: Payer: Self-pay

## 2023-07-07 ENCOUNTER — Ambulatory Visit
Admission: RE | Admit: 2023-07-07 | Discharge: 2023-07-07 | Disposition: A | Discharge status: Home / Self Care | Source: Ambulatory Visit | Attending: Radiation Oncology | Admitting: Radiation Oncology

## 2023-07-07 ENCOUNTER — Inpatient Hospital Stay

## 2023-07-07 ENCOUNTER — Encounter: Payer: Self-pay | Admitting: Internal Medicine

## 2023-07-07 VITALS — BP 142/91 | HR 105 | Resp 18

## 2023-07-07 VITALS — BP 122/82 | HR 102 | Temp 97.5°F | Resp 16 | Ht 65.0 in | Wt 270.0 lb

## 2023-07-07 DIAGNOSIS — J9610 Chronic respiratory failure, unspecified whether with hypoxia or hypercapnia: Secondary | ICD-10-CM | POA: Insufficient documentation

## 2023-07-07 DIAGNOSIS — J449 Chronic obstructive pulmonary disease, unspecified: Secondary | ICD-10-CM | POA: Insufficient documentation

## 2023-07-07 DIAGNOSIS — Z79630 Long term (current) use of alkylating agent: Secondary | ICD-10-CM | POA: Insufficient documentation

## 2023-07-07 DIAGNOSIS — G629 Polyneuropathy, unspecified: Secondary | ICD-10-CM | POA: Insufficient documentation

## 2023-07-07 DIAGNOSIS — F17210 Nicotine dependence, cigarettes, uncomplicated: Secondary | ICD-10-CM | POA: Insufficient documentation

## 2023-07-07 DIAGNOSIS — R042 Hemoptysis: Secondary | ICD-10-CM | POA: Insufficient documentation

## 2023-07-07 DIAGNOSIS — C3412 Malignant neoplasm of upper lobe, left bronchus or lung: Secondary | ICD-10-CM

## 2023-07-07 DIAGNOSIS — L259 Unspecified contact dermatitis, unspecified cause: Secondary | ICD-10-CM | POA: Diagnosis not present

## 2023-07-07 DIAGNOSIS — R21 Rash and other nonspecific skin eruption: Secondary | ICD-10-CM | POA: Insufficient documentation

## 2023-07-07 DIAGNOSIS — G8929 Other chronic pain: Secondary | ICD-10-CM | POA: Insufficient documentation

## 2023-07-07 DIAGNOSIS — Z5111 Encounter for antineoplastic chemotherapy: Secondary | ICD-10-CM | POA: Insufficient documentation

## 2023-07-07 DIAGNOSIS — Z510 Encounter for antineoplastic radiation therapy: Secondary | ICD-10-CM | POA: Insufficient documentation

## 2023-07-07 DIAGNOSIS — Z9181 History of falling: Secondary | ICD-10-CM | POA: Insufficient documentation

## 2023-07-07 DIAGNOSIS — Z9981 Dependence on supplemental oxygen: Secondary | ICD-10-CM | POA: Insufficient documentation

## 2023-07-07 DIAGNOSIS — R072 Precordial pain: Secondary | ICD-10-CM | POA: Insufficient documentation

## 2023-07-07 DIAGNOSIS — Z79633 Long term (current) use of mitotic inhibitor: Secondary | ICD-10-CM | POA: Insufficient documentation

## 2023-07-07 LAB — CMP (CANCER CENTER ONLY)
ALT: 23 U/L (ref 0–44)
AST: 19 U/L (ref 15–41)
Albumin: 3.7 g/dL (ref 3.5–5.0)
Alkaline Phosphatase: 85 U/L (ref 38–126)
Anion gap: 10 (ref 5–15)
BUN: 22 mg/dL — ABNORMAL HIGH (ref 6–20)
CO2: 24 mmol/L (ref 22–32)
Calcium: 9.2 mg/dL (ref 8.9–10.3)
Chloride: 103 mmol/L (ref 98–111)
Creatinine: 0.86 mg/dL (ref 0.44–1.00)
GFR, Estimated: >60 mL/min (ref >60)
Glucose, Bld: 117 mg/dL — ABNORMAL HIGH (ref 70–99)
Potassium: 4.2 mmol/L (ref 3.5–5.1)
Sodium: 137 mmol/L (ref 135–145)
Total Bilirubin: 0.3 mg/dL (ref 0.0–1.2)
Total Protein: 7.2 g/dL (ref 6.5–8.1)

## 2023-07-07 LAB — CBC WITH DIFFERENTIAL (CANCER CENTER ONLY)
Abs Immature Granulocytes: 0.08 10*3/uL — ABNORMAL HIGH (ref 0.00–0.07)
Basophils Absolute: 0.1 10*3/uL (ref 0.0–0.1)
Basophils Relative: 1 %
Eosinophils Absolute: 0.1 10*3/uL (ref 0.0–0.5)
Eosinophils Relative: 1 %
HCT: 39.3 % (ref 36.0–46.0)
Hemoglobin: 12.8 g/dL (ref 12.0–15.0)
Immature Granulocytes: 1 %
Lymphocytes Relative: 11 %
Lymphs Abs: 0.8 10*3/uL (ref 0.7–4.0)
MCH: 31.2 pg (ref 26.0–34.0)
MCHC: 32.6 g/dL (ref 30.0–36.0)
MCV: 95.9 fL (ref 80.0–100.0)
Monocytes Absolute: 0.5 10*3/uL (ref 0.1–1.0)
Monocytes Relative: 7 %
Neutro Abs: 5.9 10*3/uL (ref 1.7–7.7)
Neutrophils Relative %: 79 %
Platelet Count: 277 10*3/uL (ref 150–400)
RBC: 4.1 MIL/uL (ref 3.87–5.11)
RDW: 14.2 % (ref 11.5–15.5)
WBC Count: 7.5 10*3/uL (ref 4.0–10.5)
nRBC: 0 % (ref 0.0–0.2)

## 2023-07-07 LAB — RAD ONC ARIA SESSION SUMMARY
Course Elapsed Days: 23
Plan Fractions Treated to Date: 17
Plan Prescribed Dose Per Fraction: 2 Gy
Plan Total Fractions Prescribed: 33
Plan Total Prescribed Dose: 66 Gy
Reference Point Dosage Given to Date: 34 Gy
Reference Point Session Dosage Given: 2 Gy
Session Number: 17

## 2023-07-07 MED ORDER — HEPARIN SOD (PORK) LOCK FLUSH 100 UNIT/ML IV SOLN
500.0000 [IU] | Freq: Once | INTRAVENOUS | Status: AC | PRN
  Administered 2023-07-07: 500 [IU]
  Filled 2023-07-07: qty 5

## 2023-07-07 MED ORDER — PALONOSETRON HCL INJECTION 0.25 MG/5ML
0.2500 mg | Freq: Once | INTRAVENOUS | Status: AC
  Administered 2023-07-07: 0.25 mg via INTRAVENOUS
  Filled 2023-07-07: qty 5

## 2023-07-07 MED ORDER — SODIUM CHLORIDE 0.9% FLUSH
10.0000 mL | INTRAVENOUS | Status: DC | PRN
  Administered 2023-07-07: 10 mL
  Filled 2023-07-07: qty 10

## 2023-07-07 MED ORDER — FAMOTIDINE IN NACL 20-0.9 MG/50ML-% IV SOLN
20.0000 mg | Freq: Once | INTRAVENOUS | Status: AC
  Administered 2023-07-07: 20 mg via INTRAVENOUS
  Filled 2023-07-07: qty 50

## 2023-07-07 MED ORDER — DIPHENHYDRAMINE HCL 50 MG/ML IJ SOLN
50.0000 mg | Freq: Once | INTRAMUSCULAR | Status: AC
  Administered 2023-07-07: 50 mg via INTRAVENOUS
  Filled 2023-07-07: qty 1

## 2023-07-07 MED ORDER — DEXAMETHASONE SODIUM PHOSPHATE 10 MG/ML IJ SOLN
10.0000 mg | Freq: Once | INTRAMUSCULAR | Status: AC
  Administered 2023-07-07: 10 mg via INTRAVENOUS
  Filled 2023-07-07: qty 1

## 2023-07-07 MED ORDER — SODIUM CHLORIDE 0.9 % IV SOLN
45.0000 mg/m2 | Freq: Once | INTRAVENOUS | Status: AC
  Administered 2023-07-07: 102 mg via INTRAVENOUS
  Filled 2023-07-07: qty 17

## 2023-07-07 MED ORDER — SODIUM CHLORIDE 0.9 % IV SOLN
INTRAVENOUS | Status: DC
  Filled 2023-07-07: qty 250

## 2023-07-07 MED ORDER — SODIUM CHLORIDE 0.9 % IV SOLN
300.0000 mg | Freq: Once | INTRAVENOUS | Status: AC
  Administered 2023-07-07: 300 mg via INTRAVENOUS
  Filled 2023-07-07: qty 30

## 2023-07-07 NOTE — Progress Notes (Signed)
 Patient here for follow up. Voices no new concerns.

## 2023-07-07 NOTE — Progress Notes (Signed)
 Nutrition Follow-up:  Patient with lung cancer.  Patient on concurrent radiation and chemotherapy (carbo/taxol ).    Met with patient during infusion. Reports that her appetite is decreased.  She is still eating, however (meats, some vegetables and few fruits).  Drinking water and Mt Dew.     Medications: reviewed  Labs: glucose 117  Anthropometrics:   Weight 270 lb today  271 lb 9.6 oz on 4/11 277 lb 5 oz on 3/26 283 lb on 8/13    NUTRITION DIAGNOSIS: Food and nutrition related knowledge deficit improved   INTERVENTION:  Encouraged healthy food choices including lean protein and plant foods.    MONITORING, EVALUATION, GOAL: weight trends, intake   NEXT VISIT: as needed  Froylan Hobby B. Zollie Hipp, CSO, LDN Registered Dietitian [phone]

## 2023-07-07 NOTE — Assessment & Plan Note (Addendum)
#   PET scan-January/February 2025- the treated right upper lobe nodule demonstrates no significant residual update [s/p SBRT 2024]. However new enlarging mediastinal and right hilar lymph nodes demonstrated on recent CT are hypermetabolic, consistent with metastatic disease.  No evidence of distant metastatic disease. MARCH 2025- SQUAMOUS CELL CA of LN Bx- [Dr.Aleskerov].  MARCH 2025- Recurrent-stage III squamous of carcinoma- APRIL- 2025-CarboTaxol RT-followed by durvalumab  # Proceed with cycle # 4 of CarboTaxol weekly of planned 6-8 cycles. [with RT- 5/22]. Labs-CBC/chemistries were reviewed with the patient.will plan CT scan about a month post chemo.   # History of falls-  recent falls [Hx neuro-PT]- ? Etiology- refer to maureen.  # Left LE rash: likely contact dermatitis vs early cellulitis-  continue Bactrim TID/weekly [Dr.A; and ]topical triamcinolone  if pruritic- stable.   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   # Chronic respiratory failure/COPD on 2 lits [Dr.Fleming]- stable.   # Active smoker [since age of 6-8]- tried Chantix- failed multiple interventions.  Again encouraged to quit smoking.  #  pain management re: chronic pain- on narcotics-   # Multiple reactions at baseline- monitor closely    # IV access: Mediport placement functional  PS-/transporation # DISPOSITION: # pain management re: chronic pain # History of falls-refer to maureen. #  today chemo- cabo-taxol  #  in 1 week-MD; port labs-CBC CMP carbo-taxol  #  in 2 week MD; port labs-CBC CMP carbo-taxol  # in 3 week-APP; port labs-CBC CMP carbo-taxol -Dr.B

## 2023-07-07 NOTE — Progress Notes (Signed)
 [facility] CONSULT NOTE  Patient Care Team: Inc, [facility] as PCP - General Drake Gens, RN as Oncology Nurse Navigator Gwyn Leos, MD as Consulting Physician (Oncology)  CHIEF COMPLAINTS/PURPOSE OF CONSULTATION:   Oncology History Overview Note  # MAY 2024- RUL presumed  [poor candidate for biopsy at the time] stage I NCSLC- s/p SBRT.     # PET scan-January/February 2025- the treated right upper lobe nodule demonstrates no significant residual update. However new enlarging mediastinal and right hilar lymph nodes demonstrated on recent CT are hypermetabolic, consistent with metastatic disease.  No evidence of distant metastatic disease. MARCH 2025- SQUAMOUS CELL CA of LN Bx- [Dr.Aleskerov]  # April 11th, 2025- Carbo-Taxol - RT   # Chronic Respiratory failure/COPD- on 2 lits home O2.     Primary cancer of left upper lobe of lung (HCC)  04/24/2023 Initial Diagnosis   Primary cancer of left upper lobe of lung (HCC)   04/24/2023 Cancer Staging   Staging form: Lung, AJCC V9 - Pathologic: Stage IIIA (pT1c, pN2b, cM0) - Signed by Gwyn Leos, MD on 05/24/2023   06/17/2023 -  Chemotherapy   Patient is on Treatment Plan : LUNG Carboplatin  + Paclitaxel  + XRT q7d     ' Lung cancer  HISTORY OF PRESENTING ILLNESS: Patient ambulating-independently.  Alone.  Logen A Mogan 58 y.o.  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  Patient had a recent fall. However, history of falls.  She states her legs/feet give out. No injuries.    Patient  continues to have chronic shortness of breath chronic cough.  Also unfortunately continues to smoke.  Continues to have chronic pain- on oxycodone .   Review of Systems  Constitutional:  Positive for  malaise/fatigue. Negative for chills, diaphoresis, fever and weight loss.  HENT:  Negative for nosebleeds and sore throat.   Eyes:  Negative for double vision.  Respiratory:  Positive for cough and shortness of breath. Negative for hemoptysis, sputum production and wheezing.   Cardiovascular:  Negative for chest pain, palpitations, orthopnea and leg swelling.  Gastrointestinal:  Negative for abdominal pain, blood in stool, constipation, diarrhea, heartburn, melena, nausea and vomiting.  Genitourinary:  Negative for dysuria, frequency and urgency.  Musculoskeletal:  Positive for back pain and joint pain.  Skin: Negative.  Negative for itching and rash.  Neurological:  Negative for dizziness, tingling, focal weakness, weakness and headaches.  Endo/Heme/Allergies:  Does not bruise/bleed easily.  Psychiatric/Behavioral:  Negative for depression. The patient is not nervous/anxious and does not have insomnia.     MEDICAL HISTORY:  Past Medical History:  Diagnosis Date   BRBPR (bright red blood per rectum)    CAP (community acquired pneumonia)    Chronic diarrhea    Chronic kidney disease    Chronic myofascial pain    Chronic pain    Chronic venous insufficiency    Cigarette smoker    COPD (chronic obstructive pulmonary disease) (HCC)    Coronary artery disease    DDD (degenerative disc disease), cervical    DDD (degenerative disc disease), thoracic    Depression    Elevated blood pressure reading without diagnosis of hypertension    Elevated hemoglobin A1c    Female hirsutism    GERD (gastroesophageal reflux disease)    Headache    migraines  History of adenomatous polyp of colon    History of kidney stones    History of sepsis    Kidney stones    Lymphedema    Mediastinal adenopathy    Morbid obesity (HCC)    Obesity, Class III, BMI 40-49.9 (morbid obesity) (HCC)    OSA (obstructive sleep apnea)    Paranoid schizophrenia (HCC)    Peripheral vascular disease (HCC)    Primary  cancer of left upper lobe of lung (HCC)    PTSD (post-traumatic stress disorder)    RLS (restless legs syndrome)    Seizures (HCC)    last seizure in Jan 2025   Sleep apnea    Supplemental oxygen  dependent    2-3 L [place]   Syncope     SURGICAL HISTORY: Past Surgical History:  Procedure Laterality Date   CERVICAL SPINE SURGERY     Fusion C6-7   CHOLECYSTECTOMY     COLONOSCOPY WITH PROPOFOL  N/A 11/18/2014   Procedure: COLONOSCOPY WITH PROPOFOL ;  Surgeon: Cassie Click, MD;  Location: [facility] ENDOSCOPY;  Service: Endoscopy;  Laterality: N/A;   ESOPHAGOGASTRODUODENOSCOPY     FLEXIBLE BRONCHOSCOPY N/A 05/13/2023   Procedure: BRONCHOSCOPY, FLEXIBLE;  Surgeon: Erskin Hearing, MD;  Location: [facility] ORS;  Service: Thoracic;  Laterality: N/A;   IR IMAGING GUIDED PORT INSERTION  06/03/2023   KNEE SURGERY Left    polpectomy N/A    TUBAL LIGATION     VIDEO BRONCHOSCOPY WITH ENDOBRONCHIAL ULTRASOUND N/A 05/13/2023   Procedure: BRONCHOSCOPY, WITH EBUS;  Surgeon: Erskin Hearing, MD;  Location: [facility] ORS;  Service: Thoracic;  Laterality: N/A;    SOCIAL HISTORY: Social History   Socioeconomic History   Marital status: Single    Spouse name: Not on file   Number of children: Not on file   Years of education: Not on file   Highest education level: Not on file  Occupational History   Not on file  Tobacco Use   Smoking status: Every Day    Current packs/day: 0.25    Average packs/day: 0.3 packs/day for 45.0 years (11.3 ttl pk-yrs)    Types: Cigarettes   Smokeless tobacco: Former  Building services engineer status: Never Used  Substance and Sexual Activity   Alcohol use: Not Currently    Alcohol/week: 1.0 standard drink of alcohol    Types: 1 Standard drinks or equivalent per week   Drug use: No   Sexual activity: Yes  Other Topics Concern   Not on file  Social History Narrative   Not on file   Social Drivers of Health   Financial Resource Strain: Low Risk  (03/29/2023)   Received from [facility]   Overall Financial Resource Strain (CARDIA)    Difficulty of Paying Living Expenses: Not hard at all  Food Insecurity: No Food Insecurity (03/29/2023)   Received from [facility]   Hunger Vital Sign    Ran Out of Food in the Last Year: Never true    Worried About Running Out of Food in the Last Year: Never true  Transportation Needs: No Transportation Needs (03/29/2023)   Received from [facility]   PRAPARE - Transportation    Lack of Transportation (Non-Medical): No    In the past 12 months, has lack of transportation kept you from medical appointments or from getting medications?: No  Physical Activity: Insufficiently Active (08/31/2018)   Exercise Vital Sign    Days of Exercise per Week: 7 days  Minutes of Exercise per Session: 10 min  Stress: Stress Concern Present (08/31/2018)   [employer] of Occupational Health - Occupational Stress Questionnaire    Feeling of Stress : Very much  Social Connections: Moderately Isolated (08/31/2018)   Social Connection and Isolation Panel [NHANES]    Frequency of Communication with Friends and Family: More than three times a week    Frequency of Social Gatherings with Friends and Family: Once a week    Attends Religious Services: Never    Database administrator or Organizations: No    Attends Banker Meetings: Never    Marital Status: Living with partner  Intimate Partner Violence: Not At Risk (05/07/2022)   Humiliation, Afraid, Rape, and Kick questionnaire    Fear of Current or Ex-Partner: No    Emotionally Abused: No    Physically Abused: No    Sexually Abused: No    FAMILY HISTORY: Family History  Problem Relation Age of Onset   Alcohol abuse Mother    Cancer Mother    COPD Mother    Hearing loss Mother    Vision loss Mother    Alcohol abuse Father    Depression Father    Early death Father    Mental illness Father     ALLERGIES:  is allergic to  aripiprazole, baclofen, buprenorphine-naloxone , clonazepam, cyclobenzaprine, doxepin, fentanyl , lurasidone, methadone, oxymorphone, penicillins, pregabalin , quetiapine, tolmetin, trazodone , acetaminophen , bupropion, colestipol, gabapentin, hydrocodone, paroxetine, sertraline, amitriptyline hcl, carbamazepine, ciprofloxacin hcl, codeine, divalproex  sodium, etodolac, haloperidol , hyaluronate sodium, hydrochlorothiazide, ibuprofen, indocin [indomethacin], iodinated contrast media, ketoprofen, latuda [lurasidone hcl], lidocaine , meloxicam, metformin and related, methocarbamol , nabumetone, naproxen, ondansetron , opana [oxymorphone hcl], oxaprozin, perphenazine , risperidone  and related, seroquel [quetiapine fumarate], silver, suboxone [buprenorphine hcl-naloxone  hcl], tramadol, valproic acid, zoloft [sertraline hcl], bromfenac, buprenorphine, ciprofloxacin, clindamycin, clindamycin/lincomycin, flurbiprofen, metronidazole, and mirtazapine.  MEDICATIONS:  Current Outpatient Medications  Medication Sig Dispense Refill   albuterol  (ACCUNEB ) 1.25 MG/3ML nebulizer solution Take 1 ampule by nebulization every 6 (six) hours as needed for wheezing.     albuterol  (VENTOLIN  HFA) 108 (90 Base) MCG/ACT inhaler Inhale 2 puffs into the lungs every 4 (four) hours as needed.     Benzocaine , Topical, 20 % OINT Apply topically to port site 1-2 hours prior to port access. 28 g 2   Ensifentrine  (OHTUVAYRE ) 3 MG/2.5ML SUSP Inhale 1 vial into the lungs in the morning and at bedtime.     EPINEPHrine 0.3 mg/0.3 mL IJ SOAJ injection Inject 0.3 mg into the muscle as needed for anaphylaxis.     fluconazole  (DIFLUCAN ) 200 MG tablet Take 1 tablet (200 mg total) by mouth daily for 10 days. 10 tablet 0   naloxone  (NARCAN ) nasal spray 4 mg/0.1 mL CALL 911. INSTILL 1 SPRAY IN ONE NOSTRIL. MAY REPEAT Q 2 TO 3 MINUTES IF SYMPTOMS OF AN OPIOID EMERGENCY PERSISTS. ALTERNATE NOSTRILS.     oxyCODONE  (ROXICODONE ) 15 MG immediate release tablet Take  15 mg by mouth in the morning, at noon, in the evening, and at bedtime.     OXYGEN  Inhale 2-3 L into the lungs continuous.     predniSONE  (DELTASONE ) 5 MG tablet Take 1 tablet by mouth daily with breakfast.     prochlorperazine  (COMPAZINE ) 10 MG tablet Take 1 tablet (10 mg total) by mouth every 6 (six) hours as needed for nausea or vomiting. 40 tablet 1   roflumilast (DALIRESP) 500 MCG TABS tablet Take 1 tablet by mouth every morning.     sulfamethoxazole-trimethoprim (BACTRIM) 400-80 MG tablet Take  1 tablet by mouth 3 (three) times a week.     tiZANidine  (ZANAFLEX ) 4 MG tablet Take 4 mg by mouth every 6 (six) hours as needed for muscle spasms.     TRELEGY ELLIPTA 100-62.5-25 MCG/ACT AEPB Inhale 1 puff into the lungs every morning.     rOPINIRole  (REQUIP ) 0.5 MG tablet Take 1 tablet (0.5 mg total) by mouth at bedtime. (Patient taking differently: Take 1 mg by mouth at bedtime.) 30 tablet 11   triamcinolone  cream (KENALOG ) 0.1 % Apply 1 Application topically 2 (two) times daily. (Patient not taking: Reported on 07/07/2023) 45 g 0   No current facility-administered medications for this visit.    PHYSICAL EXAMINATION:   Vitals:   07/07/23 0826  BP: 122/82  Pulse: (!) 102  Resp: 16  Temp: (!) 97.5 F (36.4 C)  SpO2: 95%      Filed Weights   07/07/23 0826  Weight: 270 lb (122.5 kg)   Maculopapular rash noted on the left lower extremity-behind the shin  Physical Exam Vitals and nursing note reviewed.  HENT:     Head: Normocephalic and atraumatic.     Mouth/Throat:     Pharynx: Oropharynx is clear.  Eyes:     Extraocular Movements: Extraocular movements intact.     Pupils: Pupils are equal, round, and reactive to light.  Cardiovascular:     Rate and Rhythm: Normal rate and regular rhythm.  Pulmonary:     Comments: Decreased breath sounds bilaterally.  Abdominal:     Palpations: Abdomen is soft.  Musculoskeletal:        General: Normal range of motion.     Cervical back:  Normal range of motion.  Skin:    General: Skin is warm.  Neurological:     General: No focal deficit present.     Mental Status: She is alert and oriented to person, place, and time.  Psychiatric:        Behavior: Behavior normal.        Judgment: Judgment normal.     LABORATORY DATA:  I have reviewed the data as listed Lab Results  Component Value Date   WBC 7.5 07/07/2023   HGB 12.8 07/07/2023   HCT 39.3 07/07/2023   MCV 95.9 07/07/2023   PLT 277 07/07/2023   Recent Labs    06/23/23 0811 06/30/23 0840 07/07/23 0814  NA 137 135 137  K 3.9 4.3 4.2  CL 103 103 103  CO2 25 23 24   GLUCOSE 128* 113* 117*  BUN 19 17 22*  CREATININE 0.83 0.76 0.86  CALCIUM 9.0 9.3 9.2  GFRNONAA >60 >60 >60  PROT 7.0 7.7 7.2  ALBUMIN 3.4* 3.7 3.7  AST 14* 20 19  ALT 17 25 23   ALKPHOS 73 81 85  BILITOT 0.4 0.6 0.3    RADIOGRAPHIC STUDIES: I have personally reviewed the radiological images as listed and agreed with the findings in the report. No results found.   Primary cancer of left upper lobe of lung (HCC) # PET scan-January/February 2025- the treated right upper lobe nodule demonstrates no significant residual update [s/p SBRT 2024]. However new enlarging mediastinal and right hilar lymph nodes demonstrated on recent CT are hypermetabolic, consistent with metastatic disease.  No evidence of distant metastatic disease. MARCH 2025- SQUAMOUS CELL CA of LN Bx- [Dr.Aleskerov].  MARCH 2025- Recurrent-stage III squamous of carcinoma- APRIL- 2025-CarboTaxol RT-followed by durvalumab  # Proceed with cycle # 4 of CarboTaxol weekly of planned 6-8 cycles. [with RT- 5/22].  Labs-CBC/chemistries were reviewed with the patient.will plan CT scan about a month post chemo.   # History of falls-  recent falls [Hx neuro-PT]- ? Etiology- refer to maureen.  # Left LE rash: likely contact dermatitis vs early cellulitis-  continue Bactrim TID/weekly [Dr.A; and ]topical triamcinolone  if pruritic- stable.    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   # Chronic respiratory failure/COPD on 2 lits [Dr.Fleming]- stable.   # Active smoker [since age of 6-8]- tried Chantix- failed multiple interventions.  Again encouraged to quit smoking.  #  pain management re: chronic pain- on narcotics-   # Multiple reactions at baseline- monitor closely    # IV access: Mediport placement functional  PS-/transporation # DISPOSITION: # pain management re: chronic pain # History of falls-refer to maureen. #  today chemo- cabo-taxol  #  in 1 week-MD; port labs-CBC CMP carbo-taxol  #  in 2 week MD; port labs-CBC CMP carbo-taxol  # in 3 week-APP; port labs-CBC CMP carbo-taxol -Dr.B    Above plan of care was discussed with patient/family in detail.  My contact information was given to the patient/family.     Gwyn Leos, MD 07/07/2023 9:05 AM

## 2023-07-08 ENCOUNTER — Ambulatory Visit

## 2023-07-11 ENCOUNTER — Inpatient Hospital Stay (HOSPITAL_BASED_OUTPATIENT_CLINIC_OR_DEPARTMENT_OTHER): Admitting: Nurse Practitioner

## 2023-07-11 ENCOUNTER — Ambulatory Visit
Admission: RE | Admit: 2023-07-11 | Discharge: 2023-07-11 | Disposition: A | Discharge status: Home / Self Care | Source: Ambulatory Visit | Attending: Radiation Oncology | Admitting: Radiation Oncology

## 2023-07-11 ENCOUNTER — Other Ambulatory Visit: Payer: Self-pay

## 2023-07-11 VITALS — BP 115/86 | HR 120 | Temp 97.0°F | Resp 18 | Wt 273.0 lb

## 2023-07-11 DIAGNOSIS — R042 Hemoptysis: Secondary | ICD-10-CM | POA: Diagnosis not present

## 2023-07-11 DIAGNOSIS — R0789 Other chest pain: Secondary | ICD-10-CM

## 2023-07-11 DIAGNOSIS — Z5111 Encounter for antineoplastic chemotherapy: Secondary | ICD-10-CM | POA: Diagnosis not present

## 2023-07-11 DIAGNOSIS — C3490 Malignant neoplasm of unspecified part of unspecified bronchus or lung: Secondary | ICD-10-CM | POA: Diagnosis not present

## 2023-07-11 LAB — RAD ONC ARIA SESSION SUMMARY
Course Elapsed Days: 27
Plan Fractions Treated to Date: 18
Plan Prescribed Dose Per Fraction: 2 Gy
Plan Total Fractions Prescribed: 33
Plan Total Prescribed Dose: 66 Gy
Reference Point Dosage Given to Date: 36 Gy
Reference Point Session Dosage Given: 2 Gy
Session Number: 18

## 2023-07-11 NOTE — Progress Notes (Unsigned)
 Symptom Management Clinic  [facility] at [facility] A Department of the [place]. [facility] [address], [address] [place], [place] [address] [phone] (phone) [phone] (fax)  Patient Care Team: Inc, [facility] Services as PCP - [facility] [place], Nixa, RN as Oncology Nurse Navigator Gwyn Leos, MD as Consulting Physician (Oncology)   Name of the patient: Brandi Fowler  [id number]  [birth year]   Date of visit: 07/11/23  Diagnosis- Lung Cancer  Chief complaint/ Reason for visit- Hemoptysis  Heme/Onc history:  Oncology History Overview Note  # MAY 2024- RUL presumed  [poor candidate for biopsy at the time] stage I NCSLC- s/p SBRT.     # PET scan-January/February 2025- the treated right upper lobe nodule demonstrates no significant residual update. However new enlarging mediastinal and right hilar lymph nodes demonstrated on recent CT are hypermetabolic, consistent with metastatic disease.  No evidence of distant metastatic disease. MARCH 2025- SQUAMOUS CELL CA of LN Bx- [Dr.Aleskerov]  # April 11th, 2025- Carbo-Taxol - RT   # Chronic Respiratory failure/COPD- on 2 lits home O2.     Primary cancer of left upper lobe of lung (HCC)  04/24/2023 Initial Diagnosis   Primary cancer of left upper lobe of lung (HCC)   04/24/2023 Cancer Staging   Staging form: Lung, AJCC V9 - Pathologic: Stage IIIA (pT1c, pN2b, cM0) - Signed by Gwyn Leos, MD on 05/24/2023   06/17/2023 -  Chemotherapy   Patient is on Treatment Plan : LUNG Carboplatin  + Paclitaxel  + XRT q7d       Interval history- Brandi Fowler is a 58 y.o. female diagnosed with recurrent stage III lung cancer, currently receiving radiation who presents to symptom management clinic for complaints of hemoptysis. Symptoms present for past few days. Describes as initially a speck of blood and now seeing streaks in her clear sputum. Her chest hurts at site of  radiation. She has not had increased oxygen  use. Weight is stable. Continues to smoke. Worried about losing her pain management doctor who is closing [place] practice and moving to [place] which she doesn't have transportation to. No fevers or chills. Cough is unchanged.    Review of systems- Review of Systems  Constitutional:  Positive for malaise/fatigue. Negative for chills, fever and weight loss.  HENT:  Negative for hearing loss, nosebleeds, sore throat and tinnitus.   Eyes:  Negative for blurred vision and double vision.  Respiratory:  Positive for cough, hemoptysis, sputum production and shortness of breath. Negative for wheezing.   Cardiovascular:  Positive for chest pain. Negative for palpitations and leg swelling.  Gastrointestinal:  Negative for abdominal pain, blood in stool, constipation, diarrhea, melena, nausea and vomiting.  Genitourinary:  Negative for dysuria and urgency.  Musculoskeletal:  Positive for back pain and joint pain. Negative for falls and myalgias.  Skin:  Negative for itching and rash.  Neurological:  Negative for dizziness, tingling, sensory change, loss of consciousness, weakness and headaches.  Endo/Heme/Allergies:  Negative for environmental allergies. Does not bruise/bleed easily.  Psychiatric/Behavioral:  Negative for depression. The patient is nervous/anxious. The patient does not have insomnia.      Allergies  Allergen Reactions   Aripiprazole Swelling    Other Reaction(s): Other (See Comments)  Other reaction(s): Edema   Baclofen Swelling   Buprenorphine-Naloxone  Other (See Comments)    Other reaction(s): Other (See Comments), Other (See Comments), seizure  SEIZURE   Clonazepam Rash   Cyclobenzaprine Nausea And Vomiting and Swelling  Other reaction(s): swelling, vomiting   Doxepin Rash   Fentanyl  Nausea And Vomiting    Other reaction(s): Other (See Comments)   Lurasidone Swelling and Nausea Only    Other reaction(s): unknown   Methadone Swelling     Other reaction(s): Angioedema, n/v   Oxymorphone Other (See Comments)    Other reaction(s): Other (See Comments), Other (See Comments), Other (See Comments), seizure  SEIZURE  seizures   Penicillins Swelling    Other reaction(s): Throat swells  Other reaction(s): Edema, Other (See Comments), UNKNOWN   Pregabalin  Nausea Only and Swelling   Quetiapine Other (See Comments), Diarrhea and Nausea Only    Other reaction(s): Other (See Comments), swelling   Tolmetin Rash   Trazodone  Itching and Hives   Acetaminophen  Nausea And Vomiting, Diarrhea and Nausea Only    Other reaction(s): diarrhea, nausea, vomiting, Other (See Comments)   Bupropion Other (See Comments)    Other reaction(s): Abdominal Pain   Colestipol     Other reaction(s): blood in urine, tingling in hands, feet, legs, Other (See Comments)  Urine in blood tingling in hands feet legs   Gabapentin Nausea Only   Hydrocodone Itching and Nausea Only    Other reaction(s): rash, swelling, vomiting   Paroxetine Nausea Only    Other reaction(s): unknown   Sertraline Diarrhea and Nausea And Vomiting   Amitriptyline Hcl    Carbamazepine Hives   Ciprofloxacin Hcl Itching   Codeine    Divalproex  Sodium Diarrhea   Etodolac Swelling   Haloperidol      Other reaction(s): Headache   Hyaluronate Sodium    Hydrochlorothiazide     unknown   Ibuprofen Other (See Comments)    Bleeding in stomach   Indocin [Indomethacin]    Iodinated Contrast Media Itching   Ketoprofen    Latuda [Lurasidone Hcl]    Lidocaine  Hives   Meloxicam    Metformin And Related     unknown   Methocarbamol      unknown   Nabumetone    Naproxen Hives and Nausea Only   Ondansetron     Opana [Oxymorphone Hcl] Other (See Comments)    seizures   Oxaprozin Hives   Perphenazine  Hives   Risperidone  And Related Itching   Seroquel [Quetiapine Fumarate]    Silver    Suboxone [Buprenorphine Hcl-Naloxone  Hcl] Other (See Comments)    seizures   Tramadol    Valproic  Acid Diarrhea   Zoloft [Sertraline Hcl] Nausea And Vomiting   Bromfenac Rash   Buprenorphine Rash   Ciprofloxacin Itching and Other (See Comments)    Other reaction(s): Unknown   Clindamycin Rash   Clindamycin/Lincomycin Rash   Flurbiprofen Swelling and Rash   Metronidazole     Other reaction(s): blood in urine, tingling in hands, legs, feet, Other (See Comments)  Blood in urine tingling in hands feet legs   Mirtazapine Itching    Other reaction(s): unknown   Past Medical History:  Diagnosis Date   BRBPR (bright red blood per rectum)    CAP (community acquired pneumonia)    Chronic diarrhea    Chronic kidney disease    Chronic myofascial pain    Chronic pain    Chronic venous insufficiency    Cigarette smoker    COPD (chronic obstructive pulmonary disease) (HCC)    Coronary artery disease    DDD (degenerative disc disease), cervical    DDD (degenerative disc disease), thoracic    Depression    Elevated blood pressure reading without diagnosis of hypertension  Elevated hemoglobin A1c    Female hirsutism    GERD (gastroesophageal reflux disease)    Headache    migraines   History of adenomatous polyp of colon    History of kidney stones    History of sepsis    Kidney stones    Lymphedema    Mediastinal adenopathy    Morbid obesity (HCC)    Obesity, Class III, BMI 40-49.9 (morbid obesity)    OSA (obstructive sleep apnea)    Paranoid schizophrenia (HCC)    Peripheral vascular disease (HCC)    Primary cancer of left upper lobe of lung (HCC)    PTSD (post-traumatic stress disorder)    RLS (restless legs syndrome)    Seizures (HCC)    last seizure in Jan 2025   Sleep apnea    Supplemental oxygen  dependent    2-3 L [place]   Syncope    Past Surgical History:  Procedure Laterality Date   CERVICAL SPINE SURGERY     Fusion C6-7   CHOLECYSTECTOMY     COLONOSCOPY WITH PROPOFOL  N/A 11/18/2014   Procedure: COLONOSCOPY WITH PROPOFOL ;  Surgeon: Cassie Click, MD;   Location[facility] ENDOSCOPY;  Service: Endoscopy;  Laterality: N/A;   ESOPHAGOGASTRODUODENOSCOPY     FLEXIBLE BRONCHOSCOPY N/A 05/13/2023   Procedure: BRONCHOSCOPY, FLEXIBLE;  Surgeon: Erskin Hearing, MD;  Location: [facility] ORS;  Service: Thoracic;  Laterality: N/A;   IR IMAGING GUIDED PORT INSERTION  06/03/2023   KNEE SURGERY Left    polpectomy N/A    TUBAL LIGATION     VIDEO BRONCHOSCOPY WITH ENDOBRONCHIAL ULTRASOUND N/A 05/13/2023   Procedure: BRONCHOSCOPY, WITH EBUS;  Surgeon: Erskin Hearing, MD;  Location: [facility] ORS;  Service: Thoracic;  Laterality: N/A;   Social History   Socioeconomic History   Marital status: Single    Spouse name: Not on file   Number of children: Not on file   Years of education: Not on file   Highest education level: Not on file  Occupational History   Not on file  Tobacco Use   Smoking status: Every Day    Current packs/day: 0.25    Average packs/day: 0.3 packs/day for 45.0 years (11.3 ttl pk-yrs)    Types: Cigarettes   Smokeless tobacco: Former  Building services engineer status: Never Used  Substance and Sexual Activity   Alcohol use: Not Currently    Alcohol/week: 1.0 standard drink of alcohol    Types: 1 Standard drinks or equivalent per week   Drug use: No   Sexual activity: Yes  Other Topics Concern   Not on file  Social History Narrative   Not on file   Social Drivers of Health   Financial Resource Strain: Low Risk  (03/29/2023)   Received from [facility]   Overall Financial Resource Strain (CARDIA)    Difficulty of Paying Living Expenses: Not hard at all  Food Insecurity: No Food Insecurity (03/29/2023)   Received from [facility]   Hunger Vital Sign    Ran Out of Food in the Last Year: Never true    Worried About Running Out of Food in the Last Year: Never true  Transportation Needs: No Transportation Needs (03/29/2023)   Received from [facility]   PRAPARE - Transportation    Lack of  Transportation (Non-Medical): No    In the past 12 months, has lack of transportation kept you from medical appointments or from getting medications?: No  Physical Activity: Insufficiently Active (  08/31/2018)   Exercise Vital Sign    Days of Exercise per Week: 7 days    Minutes of Exercise per Session: 10 min  Stress: Stress Concern Present (08/31/2018)   [employer] of Occupational Health - Occupational Stress Questionnaire    Feeling of Stress : Very much  Social Connections: Moderately Isolated (08/31/2018)   Social Connection and Isolation Panel [NHANES]    Frequency of Communication with Friends and Family: More than three times a week    Frequency of Social Gatherings with Friends and Family: Once a week    Attends Religious Services: Never    Database administrator or Organizations: No    Attends Banker Meetings: Never    Marital Status: Living with partner  Intimate Partner Violence: Not At Risk (05/07/2022)   Humiliation, Afraid, Rape, and Kick questionnaire    Fear of Current or Ex-Partner: No    Emotionally Abused: No    Physically Abused: No    Sexually Abused: No   Family History  Problem Relation Age of Onset   Alcohol abuse Mother    Cancer Mother    COPD Mother    Hearing loss Mother    Vision loss Mother    Alcohol abuse Father    Depression Father    Early death Father    Mental illness Father     Current Outpatient Medications:    albuterol  (ACCUNEB ) 1.25 MG/3ML nebulizer solution, Take 1 ampule by nebulization every 6 (six) hours as needed for wheezing., Disp: , Rfl:    albuterol  (VENTOLIN  HFA) 108 (90 Base) MCG/ACT inhaler, Inhale 2 puffs into the lungs every 4 (four) hours as needed., Disp: , Rfl:    Ensifentrine  (OHTUVAYRE ) 3 MG/2.5ML SUSP, Inhale 1 vial into the lungs in the morning and at bedtime., Disp: , Rfl:    oxyCODONE  (ROXICODONE ) 15 MG immediate release tablet, Take 15 mg by mouth in the morning, at noon, in the evening, and at  bedtime., Disp: , Rfl:    OXYGEN , Inhale 2-3 L into the lungs continuous., Disp: , Rfl:    predniSONE  (DELTASONE ) 5 MG tablet, Take 1 tablet by mouth daily with breakfast., Disp: , Rfl:    roflumilast (DALIRESP) 500 MCG TABS tablet, Take 1 tablet by mouth every morning., Disp: , Rfl:    sulfamethoxazole-trimethoprim (BACTRIM) 400-80 MG tablet, Take 1 tablet by mouth 3 (three) times a week., Disp: , Rfl:    tiZANidine  (ZANAFLEX ) 4 MG tablet, Take 4 mg by mouth every 6 (six) hours as needed for muscle spasms., Disp: , Rfl:    TRELEGY ELLIPTA 100-62.5-25 MCG/ACT AEPB, Inhale 1 puff into the lungs every morning., Disp: , Rfl:    Benzocaine , Topical, 20 % OINT, Apply topically to port site 1-2 hours prior to port access. (Patient not taking: Reported on 07/11/2023), Disp: 28 g, Rfl: 2   EPINEPHrine 0.3 mg/0.3 mL IJ SOAJ injection, Inject 0.3 mg into the muscle as needed for anaphylaxis. (Patient not taking: Reported on 07/11/2023), Disp: , Rfl:    naloxone  (NARCAN ) nasal spray 4 mg/0.1 mL, CALL 911. INSTILL 1 SPRAY IN ONE NOSTRIL. MAY REPEAT Q 2 TO 3 MINUTES IF SYMPTOMS OF AN OPIOID EMERGENCY PERSISTS. ALTERNATE NOSTRILS. (Patient not taking: Reported on 07/11/2023), Disp: , Rfl:    prochlorperazine  (COMPAZINE ) 10 MG tablet, Take 1 tablet (10 mg total) by mouth every 6 (six) hours as needed for nausea or vomiting. (Patient not taking: Reported on 07/11/2023), Disp: 40 tablet, Rfl: 1  rOPINIRole  (REQUIP ) 0.5 MG tablet, Take 1 tablet (0.5 mg total) by mouth at bedtime. (Patient taking differently: Take 1 mg by mouth at bedtime.), Disp: 30 tablet, Rfl: 11   triamcinolone  cream (KENALOG ) 0.1 %, Apply 1 Application topically 2 (two) times daily. (Patient not taking: Reported on 06/30/2023), Disp: 45 g, Rfl: 0  Physical exam:  Vitals:   07/11/23 1446 07/11/23 1447 07/11/23 1451  BP:   115/86  Pulse:  (!) 120   Resp:  18   Temp:   (!) 97 F (36.1 C)  TempSrc:   Tympanic  SpO2:  98%   Weight: 273 lb (123.8 kg)      Physical Exam Vitals reviewed.  Constitutional:      General: She is not in acute distress.    Appearance: She is obese.     Comments: Accompanied by spouse. On oxygen  via [place].   Cardiovascular:     Rate and Rhythm: Regular rhythm. Tachycardia present.  Pulmonary:     Effort: No respiratory distress.     Comments: Diminished bilaterally Chest:     Chest wall: No tenderness.  Abdominal:     General: There is no distension.  Musculoskeletal:     Right lower leg: No edema.     Left lower leg: No edema.  Neurological:     Mental Status: She is alert and oriented to person, place, and time.  Psychiatric:        Attention and Perception: Attention normal.        Mood and Affect: Mood is anxious.        Speech: Speech is tangential.        Latest Ref Rng & Units 07/07/2023    8:14 AM  CMP  Glucose 70 - 99 mg/dL 161   BUN 6 - 20 mg/dL 22   Creatinine 0.96 - 1.00 mg/dL 0.45   Sodium 409 - 811 mmol/L 137   Potassium 3.5 - 5.1 mmol/L 4.2   Chloride 98 - 111 mmol/L 103   CO2 22 - 32 mmol/L 24   Calcium 8.9 - 10.3 mg/dL 9.2   Total Protein 6.5 - 8.1 g/dL 7.2   Total Bilirubin 0.0 - 1.2 mg/dL 0.3   Alkaline Phos 38 - 126 U/L 85   AST 15 - 41 U/L 19   ALT 0 - 44 U/L 23       Latest Ref Rng & Units 07/07/2023    8:14 AM  CBC  WBC 4.0 - 10.5 K/uL 7.5   Hemoglobin 12.0 - 15.0 g/dL 91.4   Hematocrit 78.2 - 46.0 % 39.3   Platelets 150 - 400 K/uL 277     No images are attached to the encounter.  No results found.  Assessment and plan- Patient is a 58 y.o. female diagnosed with recurrent Stage III SCC of Lung, Currently receiving definitive carbo-taxol  chemotherapy + radiation who presents to Symptom Management Clinic for complaints of:   Hemoptysis- question if related to radiation. Scans reviewed and erosion of tumor into trachea is low likelihood. Question COPD exacerbation vs PE. Plan for CTA to evaluate further. If negative, recommend monitoring closely.  Contrast allergy-  Take prednisone  50 mg by mouth 13, 7, and 1 hour prior to imaging study in addition to benadryl  50 mg by mouth 1 hour prior to imaging study. Advised this may cause drowsiness and I recommend a driver.  Pain- chronic pain. Worse which I suspect is related to radiation. May benefit from course of steroids  with close monitoring of her blood sugars. Followed by pain management. Limited options due to her transportation issues. Advised that cancer center does not do chronic non-cancer related pain management. She will f/u with PCP.    Visit Diagnosis 1. Cough with hemoptysis   2. Midsternal chest pain   3. Malignant neoplasm of lung, unspecified laterality, unspecified part of lung (HCC)    Patient expressed understanding and was in agreement with this plan. She also understands that She can call clinic at any time with any questions, concerns, or complaints.   Thank you for allowing me to participate in the care of this patient.   Kenney Peacemaker, DNP, AGNP-C, AOCNP Cancer Center at [facility] [phone]

## 2023-07-12 ENCOUNTER — Encounter: Payer: Self-pay | Admitting: Nurse Practitioner

## 2023-07-12 ENCOUNTER — Other Ambulatory Visit: Payer: Self-pay

## 2023-07-12 ENCOUNTER — Ambulatory Visit
Admission: RE | Admit: 2023-07-12 | Discharge: 2023-07-12 | Disposition: A | Discharge status: Home / Self Care | Source: Ambulatory Visit | Attending: Radiation Oncology | Admitting: Radiation Oncology

## 2023-07-12 ENCOUNTER — Ambulatory Visit
Admission: RE | Admit: 2023-07-12 | Discharge: 2023-07-12 | Disposition: A | Discharge status: Home / Self Care | Source: Ambulatory Visit | Attending: Nurse Practitioner | Admitting: Nurse Practitioner

## 2023-07-12 ENCOUNTER — Inpatient Hospital Stay

## 2023-07-12 ENCOUNTER — Telehealth: Payer: Self-pay | Admitting: Nurse Practitioner

## 2023-07-12 DIAGNOSIS — C3490 Malignant neoplasm of unspecified part of unspecified bronchus or lung: Secondary | ICD-10-CM

## 2023-07-12 DIAGNOSIS — R0789 Other chest pain: Secondary | ICD-10-CM | POA: Insufficient documentation

## 2023-07-12 DIAGNOSIS — R042 Hemoptysis: Secondary | ICD-10-CM | POA: Insufficient documentation

## 2023-07-12 DIAGNOSIS — Z5111 Encounter for antineoplastic chemotherapy: Secondary | ICD-10-CM | POA: Diagnosis not present

## 2023-07-12 LAB — RAD ONC ARIA SESSION SUMMARY
Course Elapsed Days: 28
Plan Fractions Treated to Date: 19
Plan Prescribed Dose Per Fraction: 2 Gy
Plan Total Fractions Prescribed: 33
Plan Total Prescribed Dose: 66 Gy
Reference Point Dosage Given to Date: 38 Gy
Reference Point Session Dosage Given: 2 Gy
Session Number: 19

## 2023-07-12 MED ORDER — SODIUM CHLORIDE 0.9% FLUSH
10.0000 mL | Freq: Once | INTRAVENOUS | Status: AC
  Administered 2023-07-12: 10 mL via INTRAVENOUS
  Filled 2023-07-12: qty 10

## 2023-07-12 MED ORDER — PREDNISONE 50 MG PO TABS
ORAL_TABLET | ORAL | 0 refills | Status: DC
Start: 2023-07-12 — End: 2023-07-27

## 2023-07-12 MED ORDER — HEPARIN SOD (PORK) LOCK FLUSH 100 UNIT/ML IV SOLN
INTRAVENOUS | Status: AC
  Filled 2023-07-12: qty 5

## 2023-07-12 MED ORDER — DIPHENHYDRAMINE HCL 50 MG PO TABS: ORAL_TABLET | ORAL | 0 refills | Status: DC

## 2023-07-12 MED ORDER — IOHEXOL 350 MG/ML SOLN
75.0000 mL | Freq: Once | INTRAVENOUS | Status: AC | PRN
  Administered 2023-07-12: 75 mL via INTRAVENOUS

## 2023-07-12 MED ORDER — HEPARIN SOD (PORK) LOCK FLUSH 100 UNIT/ML IV SOLN
500.0000 [IU] | Freq: Once | INTRAVENOUS | Status: DC
  Filled 2023-07-12: qty 5

## 2023-07-12 NOTE — Progress Notes (Signed)
 Patient accessed for CT scan, had faint blood return. [facility] mall

## 2023-07-12 NOTE — Telephone Encounter (Signed)
 Pt called and stated someone called her from the cancer center and did not leave a vm

## 2023-07-13 ENCOUNTER — Inpatient Hospital Stay: Admitting: Occupational Therapy

## 2023-07-13 ENCOUNTER — Ambulatory Visit

## 2023-07-14 ENCOUNTER — Telehealth: Payer: Self-pay | Admitting: *Deleted

## 2023-07-14 ENCOUNTER — Other Ambulatory Visit: Payer: Self-pay

## 2023-07-14 ENCOUNTER — Encounter: Payer: Self-pay | Admitting: Internal Medicine

## 2023-07-14 ENCOUNTER — Emergency Department
Admission: EM | Admit: 2023-07-14 | Discharge: 2023-07-14 | Disposition: A | Discharge status: Home / Self Care | Attending: Emergency Medicine | Admitting: Emergency Medicine

## 2023-07-14 ENCOUNTER — Ambulatory Visit
Admission: RE | Admit: 2023-07-14 | Discharge: 2023-07-14 | Disposition: A | Discharge status: Home / Self Care | Source: Ambulatory Visit | Attending: Radiation Oncology | Admitting: Radiation Oncology

## 2023-07-14 ENCOUNTER — Inpatient Hospital Stay

## 2023-07-14 ENCOUNTER — Emergency Department

## 2023-07-14 ENCOUNTER — Inpatient Hospital Stay (HOSPITAL_BASED_OUTPATIENT_CLINIC_OR_DEPARTMENT_OTHER): Admitting: Internal Medicine

## 2023-07-14 VITALS — BP 86/56 | HR 97 | Temp 98.6°F | Resp 19 | Ht 65.0 in | Wt 276.4 lb

## 2023-07-14 DIAGNOSIS — R0602 Shortness of breath: Secondary | ICD-10-CM | POA: Insufficient documentation

## 2023-07-14 DIAGNOSIS — R079 Chest pain, unspecified: Secondary | ICD-10-CM | POA: Insufficient documentation

## 2023-07-14 DIAGNOSIS — Z85118 Personal history of other malignant neoplasm of bronchus and lung: Secondary | ICD-10-CM | POA: Insufficient documentation

## 2023-07-14 DIAGNOSIS — C3412 Malignant neoplasm of upper lobe, left bronchus or lung: Secondary | ICD-10-CM

## 2023-07-14 DIAGNOSIS — J449 Chronic obstructive pulmonary disease, unspecified: Secondary | ICD-10-CM | POA: Diagnosis not present

## 2023-07-14 DIAGNOSIS — Z5111 Encounter for antineoplastic chemotherapy: Secondary | ICD-10-CM | POA: Diagnosis not present

## 2023-07-14 HISTORY — DX: Malignant neoplasm of unspecified part of unspecified bronchus or lung: C34.90

## 2023-07-14 LAB — CMP (CANCER CENTER ONLY)
ALT: 21 U/L (ref 0–44)
AST: 17 U/L (ref 15–41)
Albumin: 3.2 g/dL — ABNORMAL LOW (ref 3.5–5.0)
Alkaline Phosphatase: 64 U/L (ref 38–126)
Anion gap: 6 (ref 5–15)
BUN: 20 mg/dL (ref 6–20)
CO2: 26 mmol/L (ref 22–32)
Calcium: 8.7 mg/dL — ABNORMAL LOW (ref 8.9–10.3)
Chloride: 106 mmol/L (ref 98–111)
Creatinine: 0.71 mg/dL (ref 0.44–1.00)
GFR, Estimated: >60 mL/min (ref >60)
Glucose, Bld: 118 mg/dL — ABNORMAL HIGH (ref 70–99)
Potassium: 3.7 mmol/L (ref 3.5–5.1)
Sodium: 138 mmol/L (ref 135–145)
Total Bilirubin: 0.3 mg/dL (ref 0.0–1.2)
Total Protein: 6.2 g/dL — ABNORMAL LOW (ref 6.5–8.1)

## 2023-07-14 LAB — CBC WITH DIFFERENTIAL (CANCER CENTER ONLY)
Abs Immature Granulocytes: 0.05 10*3/uL (ref 0.00–0.07)
Basophils Absolute: 0 10*3/uL (ref 0.0–0.1)
Basophils Relative: 1 %
Eosinophils Absolute: 0 10*3/uL (ref 0.0–0.5)
Eosinophils Relative: 1 %
HCT: 34 % — ABNORMAL LOW (ref 36.0–46.0)
Hemoglobin: 11.4 g/dL — ABNORMAL LOW (ref 12.0–15.0)
Immature Granulocytes: 1 %
Lymphocytes Relative: 10 %
Lymphs Abs: 0.6 10*3/uL — ABNORMAL LOW (ref 0.7–4.0)
MCH: 31.8 pg (ref 26.0–34.0)
MCHC: 33.5 g/dL (ref 30.0–36.0)
MCV: 95 fL (ref 80.0–100.0)
Monocytes Absolute: 0.4 10*3/uL (ref 0.1–1.0)
Monocytes Relative: 6 %
Neutro Abs: 5.4 10*3/uL (ref 1.7–7.7)
Neutrophils Relative %: 81 %
Platelet Count: 157 10*3/uL (ref 150–400)
RBC: 3.58 MIL/uL — ABNORMAL LOW (ref 3.87–5.11)
RDW: 14.5 % (ref 11.5–15.5)
WBC Count: 6.5 10*3/uL (ref 4.0–10.5)
nRBC: 0 % (ref 0.0–0.2)

## 2023-07-14 LAB — CBC
HCT: 35.8 % — ABNORMAL LOW (ref 36.0–46.0)
Hemoglobin: 11.9 g/dL — ABNORMAL LOW (ref 12.0–15.0)
MCH: 32 pg (ref 26.0–34.0)
MCHC: 33.2 g/dL (ref 30.0–36.0)
MCV: 96.2 fL (ref 80.0–100.0)
Platelets: 138 10*3/uL — ABNORMAL LOW (ref 150–400)
RBC: 3.72 MIL/uL — ABNORMAL LOW (ref 3.87–5.11)
RDW: 14.7 % (ref 11.5–15.5)
WBC: 7.1 10*3/uL (ref 4.0–10.5)
nRBC: 0 % (ref 0.0–0.2)

## 2023-07-14 LAB — RAD ONC ARIA SESSION SUMMARY
Course Elapsed Days: 30
Plan Fractions Treated to Date: 20
Plan Prescribed Dose Per Fraction: 2 Gy
Plan Total Fractions Prescribed: 33
Plan Total Prescribed Dose: 66 Gy
Reference Point Dosage Given to Date: 40 Gy
Reference Point Session Dosage Given: 2 Gy
Session Number: 20

## 2023-07-14 LAB — BASIC METABOLIC PANEL WITH GFR
Anion gap: 9 (ref 5–15)
BUN: 19 mg/dL (ref 6–20)
CO2: 24 mmol/L (ref 22–32)
Calcium: 9.2 mg/dL (ref 8.9–10.3)
Chloride: 104 mmol/L (ref 98–111)
Creatinine, Ser: 0.74 mg/dL (ref 0.44–1.00)
GFR, Estimated: >60 mL/min (ref >60)
Glucose, Bld: 151 mg/dL — ABNORMAL HIGH (ref 70–99)
Potassium: 3.9 mmol/L (ref 3.5–5.1)
Sodium: 137 mmol/L (ref 135–145)

## 2023-07-14 LAB — BRAIN NATRIURETIC PEPTIDE: B Natriuretic Peptide: 42.9 pg/mL (ref 0.0–100.0)

## 2023-07-14 LAB — TROPONIN I (HIGH SENSITIVITY): Troponin I (High Sensitivity): 5 ng/L (ref <18)

## 2023-07-14 MED ORDER — MORPHINE SULFATE (PF) 4 MG/ML IV SOLN
4.0000 mg | Freq: Once | INTRAVENOUS | Status: AC
  Administered 2023-07-14: 4 mg via INTRAVENOUS
  Filled 2023-07-14: qty 1

## 2023-07-14 MED ORDER — OXYCODONE HCL 15 MG PO TABS: 15.0000 mg | ORAL_TABLET | Freq: Four times a day (QID) | ORAL | 0 refills | Status: AC | PRN

## 2023-07-14 MED ORDER — HEPARIN SOD (PORK) LOCK FLUSH 100 UNIT/ML IV SOLN
500.0000 [IU] | Freq: Once | INTRAVENOUS | Status: AC
  Administered 2023-07-14: 500 [IU] via INTRAVENOUS
  Filled 2023-07-14: qty 5

## 2023-07-14 MED ORDER — IPRATROPIUM-ALBUTEROL 0.5-2.5 (3) MG/3ML IN SOLN
3.0000 mL | Freq: Once | RESPIRATORY_TRACT | Status: AC
  Administered 2023-07-14: 3 mL via RESPIRATORY_TRACT
  Filled 2023-07-14: qty 3

## 2023-07-14 NOTE — Discharge Instructions (Signed)
 Take the oxycodone  as needed for pain.  Follow-up with your primary care doctor, oncologist, and pulmonologist.  Take the prednisone  that you are already prescribed.  Use your inhaler and/or nebulizer every 4-6 hours.  Return to the ER immediately for new, worsening, or persistent severe chest pain, difficulty breathing, cough, fever, or any other new or worsening symptoms that concern you.

## 2023-07-14 NOTE — ED Triage Notes (Signed)
 Patient states chest pain and coughing up blood for 4 days; was sent over from [facility] due to hypertension and chest pain workup. Did receive radiation today but was unable to receive chemo.

## 2023-07-14 NOTE — Progress Notes (Signed)
 [facility] CONSULT NOTE  Patient Care Team: Inc, [facility] as PCP - General Drake Gens, RN as Oncology Nurse Navigator Gwyn Leos, MD as Consulting Physician (Oncology)  CHIEF COMPLAINTS/PURPOSE OF CONSULTATION:   Oncology History Overview Note  # MAY 2024- RUL presumed  [poor candidate for biopsy at the time] stage I NCSLC- s/p SBRT.     # PET scan-January/February 2025- the treated right upper lobe nodule demonstrates no significant residual update. However new enlarging mediastinal and right hilar lymph nodes demonstrated on recent CT are hypermetabolic, consistent with metastatic disease.  No evidence of distant metastatic disease. MARCH 2025- SQUAMOUS CELL CA of LN Bx- [Dr.Aleskerov]  # April 11th, 2025- Carbo-Taxol - RT   # Chronic Respiratory failure/COPD- on 2 lits home O2.     Primary cancer of left upper lobe of lung (HCC)  04/24/2023 Initial Diagnosis   Primary cancer of left upper lobe of lung (HCC)   04/24/2023 Cancer Staging   Staging form: Lung, AJCC V9 - Pathologic: Stage IIIA (pT1c, pN2b, cM0) - Signed by Gwyn Leos, MD on 05/24/2023   06/17/2023 -  Chemotherapy   Patient is on Treatment Plan : LUNG Carboplatin  + Paclitaxel  + XRT q7d     ' Lung cancer  HISTORY OF PRESENTING ILLNESS: Patient ambulating-independently.  Alone.  Brandi Fowler 58 y.o.  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  Patient was recently evaluated in symptomatic clinic for hemoptysis.  CT scan negative for PE.  C/o of chest pain- substernal since 4 days. No radiation. Throbbing pain. Worse with breathing. 9-10/ on a scale of 10. Worsening dyspnea.    Also unfortunately continues to smoke.  Continues to have chronic pain- on oxycodone .    Review of Systems  Constitutional:  Positive for malaise/fatigue. Negative for chills, diaphoresis, fever and weight loss.  HENT:  Negative for nosebleeds and sore throat.   Eyes:  Negative for double vision.  Respiratory:  Positive for cough and shortness of breath. Negative for hemoptysis, sputum production and wheezing.   Cardiovascular:  Negative for chest pain, palpitations, orthopnea and leg swelling.  Gastrointestinal:  Negative for abdominal pain, blood in stool, constipation, diarrhea, heartburn, melena, nausea and vomiting.  Genitourinary:  Negative for dysuria, frequency and urgency.  Musculoskeletal:  Positive for back pain and joint pain.  Skin: Negative.  Negative for itching and rash.  Neurological:  Negative for dizziness, tingling, focal weakness, weakness and headaches.  Endo/Heme/Allergies:  Does not bruise/bleed easily.  Psychiatric/Behavioral:  Negative for depression. The patient is not nervous/anxious and does not have insomnia.     MEDICAL HISTORY:  Past Medical History:  Diagnosis Date   BRBPR (bright red blood per rectum)    CAP (community acquired pneumonia)    Chronic diarrhea    Chronic kidney disease    Chronic myofascial pain    Chronic pain    Chronic venous insufficiency    Cigarette smoker    COPD (chronic obstructive pulmonary disease) (HCC)    Coronary artery disease    DDD (degenerative disc disease), cervical    DDD (degenerative disc disease), thoracic    Depression    Elevated blood pressure reading without diagnosis of hypertension    Elevated hemoglobin A1c    Female hirsutism    GERD (gastroesophageal reflux disease)  Headache    migraines   History of adenomatous polyp of colon    History of kidney stones    History of sepsis    Kidney stones    Lymphedema    Mediastinal adenopathy    Morbid obesity (HCC)    Obesity, Class III, BMI 40-49.9 (morbid obesity)    OSA (obstructive sleep apnea)    Paranoid schizophrenia (HCC)     Peripheral vascular disease (HCC)    Primary cancer of left upper lobe of lung (HCC)    PTSD (post-traumatic stress disorder)    RLS (restless legs syndrome)    Seizures (HCC)    last seizure in Jan 2025   Sleep apnea    Supplemental oxygen  dependent    2-3 L [place]   Syncope     SURGICAL HISTORY: Past Surgical History:  Procedure Laterality Date   CERVICAL SPINE SURGERY     Fusion C6-7   CHOLECYSTECTOMY     COLONOSCOPY WITH PROPOFOL  N/A 11/18/2014   Procedure: COLONOSCOPY WITH PROPOFOL ;  Surgeon: Cassie Click, MD;  Location: [facility] ENDOSCOPY;  Service: Endoscopy;  Laterality: N/A;   ESOPHAGOGASTRODUODENOSCOPY     FLEXIBLE BRONCHOSCOPY N/A 05/13/2023   Procedure: BRONCHOSCOPY, FLEXIBLE;  Surgeon: Erskin Hearing, MD;  Location: [facility] ORS;  Service: Thoracic;  Laterality: N/A;   IR IMAGING GUIDED PORT INSERTION  06/03/2023   KNEE SURGERY Left    polpectomy N/A    TUBAL LIGATION     VIDEO BRONCHOSCOPY WITH ENDOBRONCHIAL ULTRASOUND N/A 05/13/2023   Procedure: BRONCHOSCOPY, WITH EBUS;  Surgeon: Erskin Hearing, MD;  Location: [facility] ORS;  Service: Thoracic;  Laterality: N/A;    SOCIAL HISTORY: Social History   Socioeconomic History   Marital status: Single    Spouse name: Not on file   Number of children: Not on file   Years of education: Not on file   Highest education level: Not on file  Occupational History   Not on file  Tobacco Use   Smoking status: Every Day    Current packs/day: 0.25    Average packs/day: 0.3 packs/day for 45.0 years (11.3 ttl pk-yrs)    Types: Cigarettes   Smokeless tobacco: Former  Building services engineer status: Never Used  Substance and Sexual Activity   Alcohol use: Not Currently    Alcohol/week: 1.0 standard drink of alcohol    Types: 1 Standard drinks or equivalent per week   Drug use: No   Sexual activity: Yes  Other Topics Concern   Not on file  Social History Narrative   Not on file   Social Drivers of Health   Financial Resource  Strain: Low Risk  (03/29/2023)   Received from [facility]   Overall Financial Resource Strain (CARDIA)    Difficulty of Paying Living Expenses: Not hard at all  Food Insecurity: No Food Insecurity (03/29/2023)   Received from [facility]   Hunger Vital Sign    Ran Out of Food in the Last Year: Never true    Worried About Running Out of Food in the Last Year: Never true  Transportation Needs: No Transportation Needs (03/29/2023)   Received from [facility]   PRAPARE - Transportation    Lack of Transportation (Non-Medical): No    In the past 12 months, has lack of transportation kept you from medical appointments or from getting medications?: No  Physical Activity: Insufficiently Active (08/31/2018)   Exercise Vital Sign    Days of Exercise  per Week: 7 days    Minutes of Exercise per Session: 10 min  Stress: Stress Concern Present (08/31/2018)   [employer] of Occupational Health - Occupational Stress Questionnaire    Feeling of Stress : Very much  Social Connections: Moderately Isolated (08/31/2018)   Social Connection and Isolation Panel [NHANES]    Frequency of Communication with Friends and Family: More than three times a week    Frequency of Social Gatherings with Friends and Family: Once a week    Attends Religious Services: Never    Database administrator or Organizations: No    Attends Banker Meetings: Never    Marital Status: Living with partner  Intimate Partner Violence: Not At Risk (05/07/2022)   Humiliation, Afraid, Rape, and Kick questionnaire    Fear of Current or Ex-Partner: No    Emotionally Abused: No    Physically Abused: No    Sexually Abused: No    FAMILY HISTORY: Family History  Problem Relation Age of Onset   Alcohol abuse Mother    Cancer Mother    COPD Mother    Hearing loss Mother    Vision loss Mother    Alcohol abuse Father    Depression Father    Early death Father    Mental  illness Father     ALLERGIES:  is allergic to aripiprazole, baclofen, buprenorphine-naloxone , clonazepam, cyclobenzaprine, doxepin, fentanyl , lurasidone, methadone, oxymorphone, penicillins, pregabalin , quetiapine, tolmetin, trazodone , acetaminophen , bupropion, colestipol, gabapentin, hydrocodone, paroxetine, sertraline, amitriptyline hcl, carbamazepine, ciprofloxacin hcl, codeine, divalproex  sodium, etodolac, haloperidol , hyaluronate sodium, hydrochlorothiazide, ibuprofen, indocin [indomethacin], ketoprofen, latuda [lurasidone hcl], lidocaine , meloxicam, metformin and related, methocarbamol , nabumetone, naproxen, ondansetron , opana [oxymorphone hcl], oxaprozin, perphenazine , risperidone  and related, seroquel [quetiapine fumarate], silver, suboxone [buprenorphine hcl-naloxone  hcl], tramadol, valproic acid, zoloft [sertraline hcl], bromfenac, buprenorphine, ciprofloxacin, clindamycin, clindamycin/lincomycin, flurbiprofen, metronidazole, mirtazapine, and other.  MEDICATIONS:  Current Outpatient Medications  Medication Sig Dispense Refill   albuterol  (ACCUNEB ) 1.25 MG/3ML nebulizer solution Take 1 ampule by nebulization every 6 (six) hours as needed for wheezing.     albuterol  (VENTOLIN  HFA) 108 (90 Base) MCG/ACT inhaler Inhale 2 puffs into the lungs every 4 (four) hours as needed.     Benzocaine , Topical, 20 % OINT Apply topically to port site 1-2 hours prior to port access. (Patient not taking: Reported on 07/11/2023) 28 g 2   diphenhydrAMINE  (BENADRYL ) 50 MG tablet Take 1 tablet (50 mg) by mouth 1 hour prior to CT scan due to contrast allergy. May cause drowsiness 30 tablet 0   Ensifentrine  (OHTUVAYRE ) 3 MG/2.5ML SUSP Inhale 1 vial into the lungs in the morning and at bedtime.     EPINEPHrine 0.3 mg/0.3 mL IJ SOAJ injection Inject 0.3 mg into the muscle as needed for anaphylaxis. (Patient not taking: Reported on 07/11/2023)     naloxone  (NARCAN ) nasal spray 4 mg/0.1 mL CALL 911. INSTILL 1 SPRAY IN ONE  NOSTRIL. MAY REPEAT Q 2 TO 3 MINUTES IF SYMPTOMS OF AN OPIOID EMERGENCY PERSISTS. ALTERNATE NOSTRILS. (Patient not taking: Reported on 07/11/2023)     oxyCODONE  (ROXICODONE ) 15 MG immediate release tablet Take 15 mg by mouth in the morning, at noon, in the evening, and at bedtime.     OXYGEN  Inhale 2-3 L into the lungs continuous.     predniSONE  (DELTASONE ) 5 MG tablet Take 1 tablet by mouth daily with breakfast.     predniSONE  (DELTASONE ) 50 MG tablet Take one tablet (50 mg) by mouth 13, 7, and 1 hour prior to imaging.  For allergy due to contrast dye 3 tablet 0   prochlorperazine  (COMPAZINE ) 10 MG tablet Take 1 tablet (10 mg total) by mouth every 6 (six) hours as needed for nausea or vomiting. (Patient not taking: Reported on 07/11/2023) 40 tablet 1   roflumilast (DALIRESP) 500 MCG TABS tablet Take 1 tablet by mouth every morning.     rOPINIRole  (REQUIP ) 0.5 MG tablet Take 1 tablet (0.5 mg total) by mouth at bedtime. (Patient taking differently: Take 1 mg by mouth at bedtime.) 30 tablet 11   sulfamethoxazole-trimethoprim (BACTRIM) 400-80 MG tablet Take 1 tablet by mouth 3 (three) times a week.     tiZANidine  (ZANAFLEX ) 4 MG tablet Take 4 mg by mouth every 6 (six) hours as needed for muscle spasms.     TRELEGY ELLIPTA 100-62.5-25 MCG/ACT AEPB Inhale 1 puff into the lungs every morning.     triamcinolone  cream (KENALOG ) 0.1 % Apply 1 Application topically 2 (two) times daily. (Patient not taking: Reported on 06/30/2023) 45 g 0   No current facility-administered medications for this visit.    PHYSICAL EXAMINATION:   Vitals:   07/14/23 0839  BP: (!) 86/56  Pulse: 97  Resp: 19  Temp: 98.6 F (37 C)  SpO2: 95%       Filed Weights   07/14/23 0839  Weight: 276 lb 6.4 oz (125.4 kg)    Maculopapular rash noted on the left lower extremity-behind the shin  Physical Exam Vitals and nursing note reviewed.  HENT:     Head: Normocephalic and atraumatic.     Mouth/Throat:     Pharynx:  Oropharynx is clear.  Eyes:     Extraocular Movements: Extraocular movements intact.     Pupils: Pupils are equal, round, and reactive to light.  Cardiovascular:     Rate and Rhythm: Normal rate and regular rhythm.  Pulmonary:     Comments: Decreased breath sounds bilaterally.  Abdominal:     Palpations: Abdomen is soft.  Musculoskeletal:        General: Normal range of motion.     Cervical back: Normal range of motion.  Skin:    General: Skin is warm.  Neurological:     General: No focal deficit present.     Mental Status: She is alert and oriented to person, place, and time.  Psychiatric:        Behavior: Behavior normal.        Judgment: Judgment normal.     LABORATORY DATA:  I have reviewed the data as listed Lab Results  Component Value Date   WBC 6.5 07/14/2023   HGB 11.4 (L) 07/14/2023   HCT 34.0 (L) 07/14/2023   MCV 95.0 07/14/2023   PLT 157 07/14/2023   Recent Labs    06/30/23 0840 07/07/23 0814 07/14/23 0821  NA 135 137 138  K 4.3 4.2 3.7  CL 103 103 106  CO2 23 24 26   GLUCOSE 113* 117* 118*  BUN 17 22* 20  CREATININE 0.76 0.86 0.71  CALCIUM 9.3 9.2 8.7*  GFRNONAA >60 >60 >60  PROT 7.7 7.2 6.2*  ALBUMIN 3.7 3.7 3.2*  AST 20 19 17   ALT 25 23 21   ALKPHOS 81 85 64  BILITOT 0.6 0.3 0.3    RADIOGRAPHIC STUDIES: I have personally reviewed the radiological images as listed and agreed with the findings in the report. CT Angio Chest Pulmonary Embolism (PE) W or WO Contrast Result Date: 07/12/2023 CLINICAL DATA:  Mid sternal chest pain with hemoptysis. Evaluate for  pulmonary embolism. On chemotherapy for lung cancer. EXAM: CT ANGIOGRAPHY CHEST WITH CONTRAST TECHNIQUE: Multidetector CT imaging of the chest was performed using the standard protocol during bolus administration of intravenous contrast. Multiplanar CT image reconstructions and MIPs were obtained to evaluate the vascular anatomy. RADIATION DOSE REDUCTION: This exam was performed according to the  departmental dose-optimization program which includes automated exposure control, adjustment of the mA and/or kV according to patient size and/or use of iterative reconstruction technique. CONTRAST:  75mL OMNIPAQUE  IOHEXOL  350 MG/ML SOLN COMPARISON:  September 04, 2023 FINDINGS: Cardiovascular: Satisfactory opacification of the pulmonary arteries to the segmental level. No evidence of pulmonary embolism. Normal heart size. No pericardial effusion. Mediastinum/Nodes: Comparison with prior examination accounting for differences in techniques no change in the large retrocaval pretracheal mass which correlate with adenopathy measuring 2.9 x 2.6 cm. No other significant mediastinal or hilar adenopathy. Lungs/Pleura: Prominence of the interstitial markings with centrilobular emphysema. Comparison with prior examinations demonstrates stable 5 mm nodule in the right upper lobe anterior segment image 72. No other suspicious pulmonary nodules. Upper Abdomen: No change in the right adrenal gland low-attenuation mass that correlate with adrenal adenoma. Musculoskeletal: Choose 1.  No bony lesions along the sternum. Review of the MIP images confirms the above findings. IMPRESSION: *No evidence of pulmonary embolism. *Stable mediastinal adenopathy. *Stable 5 mm nodule in the right upper lobe. *Stable right adrenal adenoma. *Emphysema. *No bony lesions along the sternum. *No change in the right adrenal gland low-attenuation mass that correlate with adrenal adenoma. Electronically Signed   By: Fredrich Jefferson M.D.   On: 07/12/2023 15:09     Primary cancer of left upper lobe of lung (HCC) # PET scan-January/February 2025- the treated right upper lobe nodule demonstrates no significant residual update [s/p SBRT 2024]. However new enlarging mediastinal and right hilar lymph nodes demonstrated on recent CT are hypermetabolic, consistent with metastatic disease.  No evidence of distant metastatic disease. MARCH 2025- SQUAMOUS CELL CA of  LN Bx- [Dr.Aleskerov].  MARCH 2025- Recurrent-stage III squamous of carcinoma- APRIL- 2025-CarboTaxol RT-followed by durvalumab  #Give acute chest pain- HOLD cycle # 5 of CarboTaxol weekly of planned 6-8 cycles. [with RT- 5/22]. Labs-CBC/chemistries were reviewed with the patient.  # chest pain- substernal since 4 days. No radiation. Throbbing pain. Worse with breathing. 9-10/ on a scale of 10. Worsening dyspnea- rule out ACS vs COPD exacerbation- will need emergent ER evaluation.   # History of falls-  recent falls [Hx neuro-PT]- ? Etiology- refer to maureen.  # Left LE rash: likely contact dermatitis vs early cellulitis-  continue Bactrim TID/weekly [Dr.A; and ]topical triamcinolone  if pruritic- stable.   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   # Chronic respiratory failure/COPD on 2 lits [Dr.Fleming]-worse- see above.   # Active smoker [since age of 6-8]- tried Chantix- failed multiple interventions.  Again encouraged to quit smoking.  #  pain management re: chronic pain- on narcotics-   # Multiple reactions at baseline- monitor closely    # IV access: Mediport placement functional  PS-/transportation; contrast allergy  # DISPOSITION: #  HOLD chemo- cabo-taxol -  # ER evaluation- DO NOT DE-Access #  in 1 week-MD; port labs-CBC CMP carbo-taxol  #  in 2 week APP; port labs-CBC CMP carbo-taxol  # in 3 week-MD port labs-CBC CMP carbo-taxol -Dr.B    Above plan of care was discussed with patient/family in detail.  My contact information was given to the patient/family.     Gwyn Leos, MD 07/14/2023 9:43 AM

## 2023-07-14 NOTE — Telephone Encounter (Signed)
 Called Brandi Fowler in the ED Triage to report pt will be coming into ED via car and husband. Pt was seen in [facility] this am for chemo; She is hypotensive and having a lot of chest pain and discomfort. Dr Valentine Gasmen wants her evaluated in ED. Port is accessed in Right chest.

## 2023-07-14 NOTE — Assessment & Plan Note (Addendum)
#   PET scan-January/February 2025- the treated right upper lobe nodule demonstrates no significant residual update [s/p SBRT 2024]. However new enlarging mediastinal and right hilar lymph nodes demonstrated on recent CT are hypermetabolic, consistent with metastatic disease.  No evidence of distant metastatic disease. MARCH 2025- SQUAMOUS CELL CA of LN Bx- [Dr.Aleskerov].  MARCH 2025- Recurrent-stage III squamous of carcinoma- APRIL- 2025-CarboTaxol RT-followed by durvalumab  #Give acute chest pain- HOLD cycle # 5 of CarboTaxol weekly of planned 6-8 cycles. [with RT- 5/22]. Labs-CBC/chemistries were reviewed with the patient.  # chest pain- substernal since 4 days. No radiation. Throbbing pain. Worse with breathing. 9-10/ on a scale of 10. Worsening dyspnea- rule out ACS vs COPD exacerbation- will need emergent ER evaluation.   # History of falls-  recent falls [Hx neuro-PT]- ? Etiology- refer to maureen.  # Left LE rash: likely contact dermatitis vs early cellulitis-  continue Bactrim TID/weekly [Dr.A; and ]topical triamcinolone  if pruritic- stable.   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   # Chronic respiratory failure/COPD on 2 lits [Dr.Fleming]-worse- see above.   # Active smoker [since age of 6-8]- tried Chantix- failed multiple interventions.  Again encouraged to quit smoking.  #  pain management re: chronic pain- on narcotics-   # Multiple reactions at baseline- monitor closely    # IV access: Mediport placement functional  PS-/transportation; contrast allergy  # DISPOSITION: #  HOLD chemo- cabo-taxol -  # ER evaluation- DO NOT DE-Access #  in 1 week-MD; port labs-CBC CMP carbo-taxol  #  in 2 week APP; port labs-CBC CMP carbo-taxol  # in 3 week-MD port labs-CBC CMP carbo-taxol -Dr.B

## 2023-07-14 NOTE — ED Provider Notes (Signed)
 [facility] Provider Note    Event Date/Time   First MD Initiated Contact with Patient 07/14/23 [phone]     (approximate)   History   Chest Pain   HPI  Brandi Fowler is a 58 y.o. female with a history of COPD, chronic respiratory failure on home O2, and squamous cell lung cancer on chemoradiation who presents with chest pain for the last 4 days, mainly in the center of her chest, throbbing in quality, present all the time but worse with taking a deep breath.  The patient has had some hemoptysis over the same period.  She reports some increased difficulty breathing and a cough.  She denies any vomiting or diarrhea.  She was seen at the cancer center today and sent to the ED for further workup.  I reviewed past medical records but the patient was just seen by Dr. Valentine Gasmen from oncology this morning for this chest pain and sent to the ED for rule out ACS.  She had a CT angio of the chest 2 days ago for evaluation of hemoptysis.  This was negative for PE.   Physical Exam   Triage Vital Signs: ED Triage Vitals  Encounter Vitals Group     BP 07/14/23 0945 119/80     Systolic BP Percentile --      Diastolic BP Percentile --      Pulse Rate 07/14/23 0945 (!) 101     Resp 07/14/23 0945 (!) 22     Temp 07/14/23 0945 97.6 F (36.4 C)     Temp Source 07/14/23 0945 Oral     SpO2 07/14/23 0945 99 %     Weight 07/14/23 0943 273 lb (123.8 kg)     Height 07/14/23 0943 5\' 5"  (1.651 m)     Head Circumference --      Peak Flow --      Pain Score 07/14/23 0942 10     Pain Loc --      Pain Education --      Exclude from Growth Chart --     Most recent vital signs: Vitals:   07/14/23 1230 07/14/23 1300  BP: 119/76 107/82  Pulse: (!) 111 (!) 110  Resp: 17 (!) 26  Temp:    SpO2: 97% 100%     General: Awake, no distress.  CV:  Good peripheral perfusion.  Resp:  Normal effort.  Diminished and coarse breath sounds bilaterally. Abd:  No distention.   Other:  No peripheral edema.   ED Results / Procedures / Treatments   Labs (all labs ordered are listed, but only abnormal results are displayed) Labs Reviewed  BASIC METABOLIC PANEL WITH GFR - Abnormal; Notable for the following components:      Result Value   Glucose, Bld 151 (*)    All other components within normal limits  CBC - Abnormal; Notable for the following components:   RBC 3.72 (*)    Hemoglobin 11.9 (*)    HCT 35.8 (*)    Platelets 138 (*)    All other components within normal limits  BRAIN NATRIURETIC PEPTIDE  TROPONIN I (HIGH SENSITIVITY)     EKG  ED ECG REPORT I, Lind Repine, the attending physician, personally viewed and interpreted this ECG.  Date: 07/14/2023 EKG Time: 0944 Rate: 101 Rhythm: normal sinus rhythm QRS Axis: normal Intervals: normal ST/T Wave abnormalities: normal Narrative Interpretation: no evidence of acute ischemia    RADIOLOGY  Chest x-ray: I independently viewed and  interpreted the images; there is no focal consolidation or edema  PROCEDURES:  Critical Care performed: No  Procedures   MEDICATIONS ORDERED IN ED: Medications  morphine  (PF) 4 MG/ML injection 4 mg (4 mg Intravenous Given 07/14/23 1023)  ipratropium-albuterol  (DUONEB) 0.5-2.5 (3) MG/3ML nebulizer solution 3 mL (3 mLs Nebulization Given 07/14/23 1038)  ipratropium-albuterol  (DUONEB) 0.5-2.5 (3) MG/3ML nebulizer solution 3 mL (3 mLs Nebulization Given 07/14/23 1038)  morphine  (PF) 4 MG/ML injection 4 mg (4 mg Intravenous Given 07/14/23 1215)  heparin  lock flush 100 unit/mL (500 Units Intravenous Given 07/14/23 1339)     IMPRESSION / MDM / ASSESSMENT AND PLAN / ED COURSE  I reviewed the triage vital signs and the nursing notes.  58 year old female with PMH as noted above presents with chest pain for the last 4 days associated with hemoptysis and shortness of breath.  She was sent in for the cancer center for further workup, although already had an outpatient  CT angio of the chest 2 days ago for the same symptoms which was negative.  Differential diagnosis includes, but is not limited to, musculoskeletal pain, acute bronchitis, COPD exacerbation, ACS, CHF, GERD.  There is no indication for additional workup for PE.  We will obtain chest x-ray, labs, give analgesia, bronchodilators, and reassess.  Patient's presentation is most consistent with acute presentation with potential threat to life or bodily function.  The patient is on the cardiac monitor to evaluate for evidence of arrhythmia and/or significant heart rate changes.   ----------------------------------------- 1:20 PM on 07/14/2023 -----------------------------------------  Workup is reassuring.  Chest x-ray shows no acute abnormalities.  Troponin and BNP are both negative.  BMP and CBC are unremarkable.  I had plan to keep the patient for a second troponin, however given that the symptoms have been going on for 4 days, this is not strictly necessary.  There is no clinical evidence for ACS.  The patient herself is feeling better and is eager to go home.  Overall I suspect COPD exacerbation and/or pain related to her cancer.  There is no evidence of ACS or other cardiac etiology.  I counseled the patient on the results of the workup and plan of care.  I gave strict return precautions and she expressed understanding.  Based on her PDMP record, the patient has been on oxycodone  15 mg prescribed monthly for some time.  She states that her pain management doctor left the practice so after the next 2 days she does not have any additional pain medication.  I prescribed a small quantity of her usual dose to cover her until she can follow-up with her regular doctors.  I also prescribed a course of prednisone  for COPD.  The patient has albuterol  at home.   FINAL CLINICAL IMPRESSION(S) / ED DIAGNOSES   Final diagnoses:  Nonspecific chest pain     Rx / DC Orders   ED Discharge Orders           Ordered    oxyCODONE  (ROXICODONE ) 15 MG immediate release tablet  4 times daily PRN        07/14/23 1318             Note:  This document was prepared using Dragon voice recognition software and may include unintentional dictation errors.    Lind Repine, MD 07/14/23 1451

## 2023-07-15 ENCOUNTER — Ambulatory Visit

## 2023-07-17 ENCOUNTER — Other Ambulatory Visit: Payer: Self-pay | Admitting: Nurse Practitioner

## 2023-07-18 ENCOUNTER — Ambulatory Visit
Admission: RE | Admit: 2023-07-18 | Discharge: 2023-07-18 | Disposition: A | Discharge status: Home / Self Care | Source: Ambulatory Visit | Attending: Radiation Oncology | Admitting: Radiation Oncology

## 2023-07-18 ENCOUNTER — Other Ambulatory Visit: Payer: Self-pay

## 2023-07-18 DIAGNOSIS — Z5111 Encounter for antineoplastic chemotherapy: Secondary | ICD-10-CM | POA: Diagnosis not present

## 2023-07-18 LAB — RAD ONC ARIA SESSION SUMMARY
Course Elapsed Days: 34
Plan Fractions Treated to Date: 21
Plan Prescribed Dose Per Fraction: 2 Gy
Plan Total Fractions Prescribed: 33
Plan Total Prescribed Dose: 66 Gy
Reference Point Dosage Given to Date: 42 Gy
Reference Point Session Dosage Given: 2 Gy
Session Number: 21

## 2023-07-19 ENCOUNTER — Encounter: Payer: Self-pay | Admitting: Internal Medicine

## 2023-07-19 ENCOUNTER — Ambulatory Visit

## 2023-07-19 ENCOUNTER — Telehealth: Payer: Self-pay | Admitting: *Deleted

## 2023-07-19 NOTE — Telephone Encounter (Signed)
 Patient called to cancel her radiation treatment today due to illness. She stated that she had been up all night with diarrhea and dyspnea. Writer encouraged patient to come in for evaluation from [facility] she declined.

## 2023-07-20 ENCOUNTER — Other Ambulatory Visit: Payer: Self-pay

## 2023-07-20 ENCOUNTER — Ambulatory Visit

## 2023-07-20 ENCOUNTER — Emergency Department

## 2023-07-20 ENCOUNTER — Inpatient Hospital Stay
Admission: EM | Admit: 2023-07-20 | Discharge: 2023-07-27 | DRG: 177 | Disposition: A | Discharge status: Hospice | Attending: Internal Medicine | Admitting: Internal Medicine

## 2023-07-20 ENCOUNTER — Encounter: Payer: Self-pay | Admitting: Internal Medicine

## 2023-07-20 DIAGNOSIS — I272 Pulmonary hypertension, unspecified: Secondary | ICD-10-CM | POA: Insufficient documentation

## 2023-07-20 DIAGNOSIS — G8929 Other chronic pain: Secondary | ICD-10-CM | POA: Diagnosis present

## 2023-07-20 DIAGNOSIS — Z9851 Tubal ligation status: Secondary | ICD-10-CM

## 2023-07-20 DIAGNOSIS — Z822 Family history of deafness and hearing loss: Secondary | ICD-10-CM

## 2023-07-20 DIAGNOSIS — Z79633 Long term (current) use of mitotic inhibitor: Secondary | ICD-10-CM

## 2023-07-20 DIAGNOSIS — E662 Morbid (severe) obesity with alveolar hypoventilation: Secondary | ICD-10-CM | POA: Diagnosis present

## 2023-07-20 DIAGNOSIS — M79605 Pain in left leg: Secondary | ICD-10-CM | POA: Diagnosis present

## 2023-07-20 DIAGNOSIS — I493 Ventricular premature depolarization: Secondary | ICD-10-CM | POA: Diagnosis present

## 2023-07-20 DIAGNOSIS — M5030 Other cervical disc degeneration, unspecified cervical region: Secondary | ICD-10-CM | POA: Diagnosis present

## 2023-07-20 DIAGNOSIS — C3412 Malignant neoplasm of upper lobe, left bronchus or lung: Secondary | ICD-10-CM | POA: Diagnosis present

## 2023-07-20 DIAGNOSIS — J441 Chronic obstructive pulmonary disease with (acute) exacerbation: Principal | ICD-10-CM | POA: Diagnosis present

## 2023-07-20 DIAGNOSIS — J9811 Atelectasis: Secondary | ICD-10-CM | POA: Diagnosis present

## 2023-07-20 DIAGNOSIS — Z79630 Long term (current) use of alkylating agent: Secondary | ICD-10-CM

## 2023-07-20 DIAGNOSIS — D61818 Other pancytopenia: Secondary | ICD-10-CM | POA: Diagnosis present

## 2023-07-20 DIAGNOSIS — F17210 Nicotine dependence, cigarettes, uncomplicated: Secondary | ICD-10-CM | POA: Diagnosis present

## 2023-07-20 DIAGNOSIS — Z860101 Personal history of adenomatous and serrated colon polyps: Secondary | ICD-10-CM

## 2023-07-20 DIAGNOSIS — Z716 Tobacco abuse counseling: Secondary | ICD-10-CM

## 2023-07-20 DIAGNOSIS — Z95828 Presence of other vascular implants and grafts: Secondary | ICD-10-CM | POA: Diagnosis not present

## 2023-07-20 DIAGNOSIS — J9382 Other air leak: Secondary | ICD-10-CM | POA: Diagnosis not present

## 2023-07-20 DIAGNOSIS — F209 Schizophrenia, unspecified: Secondary | ICD-10-CM | POA: Diagnosis present

## 2023-07-20 DIAGNOSIS — Z7901 Long term (current) use of anticoagulants: Secondary | ICD-10-CM

## 2023-07-20 DIAGNOSIS — Z789 Other specified health status: Secondary | ICD-10-CM

## 2023-07-20 DIAGNOSIS — Z8619 Personal history of other infectious and parasitic diseases: Secondary | ICD-10-CM

## 2023-07-20 DIAGNOSIS — M79604 Pain in right leg: Secondary | ICD-10-CM | POA: Diagnosis present

## 2023-07-20 DIAGNOSIS — Z818 Family history of other mental and behavioral disorders: Secondary | ICD-10-CM

## 2023-07-20 DIAGNOSIS — J9621 Acute and chronic respiratory failure with hypoxia: Secondary | ICD-10-CM | POA: Diagnosis present

## 2023-07-20 DIAGNOSIS — Z801 Family history of malignant neoplasm of trachea, bronchus and lung: Secondary | ICD-10-CM

## 2023-07-20 DIAGNOSIS — Z9889 Other specified postprocedural states: Secondary | ICD-10-CM

## 2023-07-20 DIAGNOSIS — R042 Hemoptysis: Secondary | ICD-10-CM | POA: Diagnosis present

## 2023-07-20 DIAGNOSIS — G4733 Obstructive sleep apnea (adult) (pediatric): Secondary | ICD-10-CM | POA: Diagnosis present

## 2023-07-20 DIAGNOSIS — Z9981 Dependence on supplemental oxygen: Secondary | ICD-10-CM | POA: Diagnosis not present

## 2023-07-20 DIAGNOSIS — F4310 Post-traumatic stress disorder, unspecified: Secondary | ICD-10-CM | POA: Diagnosis present

## 2023-07-20 DIAGNOSIS — G40909 Epilepsy, unspecified, not intractable, without status epilepticus: Secondary | ICD-10-CM | POA: Diagnosis present

## 2023-07-20 DIAGNOSIS — R000 Tachycardia, unspecified: Secondary | ICD-10-CM | POA: Diagnosis not present

## 2023-07-20 DIAGNOSIS — Z91041 Radiographic dye allergy status: Secondary | ICD-10-CM

## 2023-07-20 DIAGNOSIS — I739 Peripheral vascular disease, unspecified: Secondary | ICD-10-CM | POA: Diagnosis present

## 2023-07-20 DIAGNOSIS — G43909 Migraine, unspecified, not intractable, without status migrainosus: Secondary | ICD-10-CM | POA: Diagnosis present

## 2023-07-20 DIAGNOSIS — Z6841 Body Mass Index (BMI) 40.0 and over, adult: Secondary | ICD-10-CM

## 2023-07-20 DIAGNOSIS — Z515 Encounter for palliative care: Secondary | ICD-10-CM

## 2023-07-20 DIAGNOSIS — U071 COVID-19: Secondary | ICD-10-CM | POA: Diagnosis present

## 2023-07-20 DIAGNOSIS — Z821 Family history of blindness and visual loss: Secondary | ICD-10-CM

## 2023-07-20 DIAGNOSIS — C3490 Malignant neoplasm of unspecified part of unspecified bronchus or lung: Secondary | ICD-10-CM | POA: Diagnosis present

## 2023-07-20 DIAGNOSIS — Z881 Allergy status to other antibiotic agents status: Secondary | ICD-10-CM

## 2023-07-20 DIAGNOSIS — M549 Dorsalgia, unspecified: Secondary | ICD-10-CM | POA: Diagnosis present

## 2023-07-20 DIAGNOSIS — R911 Solitary pulmonary nodule: Secondary | ICD-10-CM | POA: Diagnosis present

## 2023-07-20 DIAGNOSIS — C771 Secondary and unspecified malignant neoplasm of intrathoracic lymph nodes: Secondary | ICD-10-CM | POA: Diagnosis present

## 2023-07-20 DIAGNOSIS — Z8719 Personal history of other diseases of the digestive system: Secondary | ICD-10-CM

## 2023-07-20 DIAGNOSIS — Z923 Personal history of irradiation: Secondary | ICD-10-CM

## 2023-07-20 DIAGNOSIS — Z66 Do not resuscitate: Secondary | ICD-10-CM | POA: Diagnosis not present

## 2023-07-20 DIAGNOSIS — Z825 Family history of asthma and other chronic lower respiratory diseases: Secondary | ICD-10-CM

## 2023-07-20 DIAGNOSIS — Z7952 Long term (current) use of systemic steroids: Secondary | ICD-10-CM

## 2023-07-20 DIAGNOSIS — I4891 Unspecified atrial fibrillation: Secondary | ICD-10-CM | POA: Diagnosis not present

## 2023-07-20 DIAGNOSIS — Z8701 Personal history of pneumonia (recurrent): Secondary | ICD-10-CM

## 2023-07-20 DIAGNOSIS — J440 Chronic obstructive pulmonary disease with acute lower respiratory infection: Secondary | ICD-10-CM | POA: Diagnosis present

## 2023-07-20 DIAGNOSIS — Z888 Allergy status to other drugs, medicaments and biological substances status: Secondary | ICD-10-CM

## 2023-07-20 DIAGNOSIS — G2581 Restless legs syndrome: Secondary | ICD-10-CM | POA: Insufficient documentation

## 2023-07-20 DIAGNOSIS — I2510 Atherosclerotic heart disease of native coronary artery without angina pectoris: Secondary | ICD-10-CM | POA: Diagnosis present

## 2023-07-20 DIAGNOSIS — C781 Secondary malignant neoplasm of mediastinum: Secondary | ICD-10-CM | POA: Diagnosis present

## 2023-07-20 DIAGNOSIS — Z9049 Acquired absence of other specified parts of digestive tract: Secondary | ICD-10-CM

## 2023-07-20 DIAGNOSIS — Z7951 Long term (current) use of inhaled steroids: Secondary | ICD-10-CM

## 2023-07-20 DIAGNOSIS — Z5982 Transportation insecurity: Secondary | ICD-10-CM

## 2023-07-20 DIAGNOSIS — K219 Gastro-esophageal reflux disease without esophagitis: Secondary | ICD-10-CM | POA: Diagnosis present

## 2023-07-20 DIAGNOSIS — Z602 Problems related to living alone: Secondary | ICD-10-CM | POA: Diagnosis present

## 2023-07-20 DIAGNOSIS — I872 Venous insufficiency (chronic) (peripheral): Secondary | ICD-10-CM | POA: Diagnosis present

## 2023-07-20 DIAGNOSIS — M1990 Unspecified osteoarthritis, unspecified site: Secondary | ICD-10-CM | POA: Diagnosis present

## 2023-07-20 DIAGNOSIS — Z886 Allergy status to analgesic agent status: Secondary | ICD-10-CM

## 2023-07-20 DIAGNOSIS — Z884 Allergy status to anesthetic agent status: Secondary | ICD-10-CM

## 2023-07-20 DIAGNOSIS — R072 Precordial pain: Secondary | ICD-10-CM | POA: Diagnosis present

## 2023-07-20 DIAGNOSIS — Z87442 Personal history of urinary calculi: Secondary | ICD-10-CM

## 2023-07-20 DIAGNOSIS — F32A Depression, unspecified: Secondary | ICD-10-CM | POA: Diagnosis present

## 2023-07-20 DIAGNOSIS — Z981 Arthrodesis status: Secondary | ICD-10-CM

## 2023-07-20 DIAGNOSIS — Z885 Allergy status to narcotic agent status: Secondary | ICD-10-CM

## 2023-07-20 DIAGNOSIS — I480 Paroxysmal atrial fibrillation: Secondary | ICD-10-CM

## 2023-07-20 DIAGNOSIS — Z880 Allergy status to penicillin: Secondary | ICD-10-CM

## 2023-07-20 DIAGNOSIS — F200 Paranoid schizophrenia: Secondary | ICD-10-CM | POA: Diagnosis present

## 2023-07-20 DIAGNOSIS — Z811 Family history of alcohol abuse and dependence: Secondary | ICD-10-CM

## 2023-07-20 DIAGNOSIS — T451X5A Adverse effect of antineoplastic and immunosuppressive drugs, initial encounter: Secondary | ICD-10-CM | POA: Diagnosis present

## 2023-07-20 DIAGNOSIS — M5134 Other intervertebral disc degeneration, thoracic region: Secondary | ICD-10-CM | POA: Diagnosis present

## 2023-07-20 LAB — COMPREHENSIVE METABOLIC PANEL WITH GFR
ALT: 39 U/L (ref 0–44)
AST: 30 U/L (ref 15–41)
Albumin: 3 g/dL — ABNORMAL LOW (ref 3.5–5.0)
Alkaline Phosphatase: 67 U/L (ref 38–126)
Anion gap: 14 (ref 5–15)
BUN: 27 mg/dL — ABNORMAL HIGH (ref 6–20)
CO2: 18 mmol/L — ABNORMAL LOW (ref 22–32)
Calcium: 8.3 mg/dL — ABNORMAL LOW (ref 8.9–10.3)
Chloride: 103 mmol/L (ref 98–111)
Creatinine, Ser: 1.18 mg/dL — ABNORMAL HIGH (ref 0.44–1.00)
GFR, Estimated: 53 mL/min — ABNORMAL LOW (ref >60)
Glucose, Bld: 161 mg/dL — ABNORMAL HIGH (ref 70–99)
Potassium: 3.9 mmol/L (ref 3.5–5.1)
Sodium: 135 mmol/L (ref 135–145)
Total Bilirubin: 0.8 mg/dL (ref 0.0–1.2)
Total Protein: 6.6 g/dL (ref 6.5–8.1)

## 2023-07-20 LAB — CBC
HCT: 36.5 % (ref 36.0–46.0)
Hemoglobin: 12.4 g/dL (ref 12.0–15.0)
MCH: 32 pg (ref 26.0–34.0)
MCHC: 34 g/dL (ref 30.0–36.0)
MCV: 94.3 fL (ref 80.0–100.0)
Platelets: 97 10*3/uL — ABNORMAL LOW (ref 150–400)
RBC: 3.87 MIL/uL (ref 3.87–5.11)
RDW: 15.5 % (ref 11.5–15.5)
WBC: 4.7 10*3/uL (ref 4.0–10.5)
nRBC: 0 % (ref 0.0–0.2)

## 2023-07-20 LAB — BLOOD GAS, VENOUS
Acid-base deficit: 1.4 mmol/L (ref 0.0–2.0)
Bicarbonate: 22.8 mmol/L (ref 20.0–28.0)
O2 Saturation: 98.5 %
Patient temperature: 37
pCO2, Ven: 36 mmHg — ABNORMAL LOW (ref 44–60)
pH, Ven: 7.41 (ref 7.25–7.43)
pO2, Ven: 98 mmHg — ABNORMAL HIGH (ref 32–45)

## 2023-07-20 LAB — BRAIN NATRIURETIC PEPTIDE: B Natriuretic Peptide: 382.4 pg/mL — ABNORMAL HIGH (ref 0.0–100.0)

## 2023-07-20 LAB — TROPONIN I (HIGH SENSITIVITY): Troponin I (High Sensitivity): 15 ng/L (ref <18)

## 2023-07-20 LAB — LACTIC ACID, PLASMA: Lactic Acid, Venous: 1.6 mmol/L (ref 0.5–1.9)

## 2023-07-20 MED ORDER — ROPINIROLE HCL 1 MG PO TABS
1.0000 mg | ORAL_TABLET | Freq: Every day | ORAL | Status: DC
  Administered 2023-07-21 – 2023-07-26 (×6): 1 mg via ORAL
  Filled 2023-07-20 (×6): qty 1

## 2023-07-20 MED ORDER — MAGNESIUM HYDROXIDE 400 MG/5ML PO SUSP: 30.0000 mL | Freq: Every day | ORAL | Status: DC | PRN

## 2023-07-20 MED ORDER — SODIUM CHLORIDE 0.9 % IV SOLN: INTRAVENOUS | Status: AC

## 2023-07-20 MED ORDER — IPRATROPIUM-ALBUTEROL 0.5-2.5 (3) MG/3ML IN SOLN
3.0000 mL | Freq: Once | RESPIRATORY_TRACT | Status: AC
  Administered 2023-07-20: 3 mL via RESPIRATORY_TRACT
  Filled 2023-07-20: qty 3

## 2023-07-20 MED ORDER — IPRATROPIUM-ALBUTEROL 0.5-2.5 (3) MG/3ML IN SOLN
3.0000 mL | Freq: Four times a day (QID) | RESPIRATORY_TRACT | Status: DC
Start: 2023-07-21 — End: 2023-07-21
  Filled 2023-07-20: qty 3

## 2023-07-20 MED ORDER — OXYCODONE HCL 5 MG PO TABS
15.0000 mg | ORAL_TABLET | Freq: Four times a day (QID) | ORAL | Status: DC | PRN
  Administered 2023-07-20 – 2023-07-26 (×12): 15 mg via ORAL
  Filled 2023-07-20 (×12): qty 3

## 2023-07-20 MED ORDER — TRAZODONE HCL 50 MG PO TABS
25.0000 mg | ORAL_TABLET | Freq: Every evening | ORAL | Status: DC | PRN
  Administered 2023-07-21: 25 mg via ORAL
  Filled 2023-07-20: qty 1

## 2023-07-20 MED ORDER — ENOXAPARIN SODIUM 80 MG/0.8ML IJ SOSY
65.0000 mg | PREFILLED_SYRINGE | INTRAMUSCULAR | Status: DC
  Filled 2023-07-20: qty 0.65

## 2023-07-20 MED ORDER — SODIUM CHLORIDE 0.9 % IV BOLUS
500.0000 mL | Freq: Once | INTRAVENOUS | Status: AC
  Administered 2023-07-20: 500 mL via INTRAVENOUS

## 2023-07-20 MED ORDER — ACETAMINOPHEN 650 MG RE SUPP: 650.0000 mg | Freq: Four times a day (QID) | RECTAL | Status: DC | PRN

## 2023-07-20 MED ORDER — IPRATROPIUM-ALBUTEROL 0.5-2.5 (3) MG/3ML IN SOLN
3.0000 mL | RESPIRATORY_TRACT | Status: DC | PRN
  Administered 2023-07-21 (×2): 3 mL via RESPIRATORY_TRACT
  Filled 2023-07-20: qty 3

## 2023-07-20 MED ORDER — METHYLPREDNISOLONE SODIUM SUCC 40 MG IJ SOLR
40.0000 mg | Freq: Two times a day (BID) | INTRAMUSCULAR | Status: DC
  Administered 2023-07-20: 40 mg via INTRAVENOUS
  Filled 2023-07-20: qty 1

## 2023-07-20 MED ORDER — GUAIFENESIN ER 600 MG PO TB12
600.0000 mg | ORAL_TABLET | Freq: Two times a day (BID) | ORAL | Status: DC
  Administered 2023-07-20 – 2023-07-27 (×14): 600 mg via ORAL
  Filled 2023-07-20 (×14): qty 1

## 2023-07-20 MED ORDER — ACETAMINOPHEN 325 MG PO TABS
650.0000 mg | ORAL_TABLET | Freq: Four times a day (QID) | ORAL | Status: DC | PRN
  Filled 2023-07-20: qty 2

## 2023-07-20 MED ORDER — TIZANIDINE HCL 4 MG PO TABS
4.0000 mg | ORAL_TABLET | Freq: Four times a day (QID) | ORAL | Status: DC | PRN
  Administered 2023-07-24 – 2023-07-26 (×7): 4 mg via ORAL
  Filled 2023-07-20 (×8): qty 1

## 2023-07-20 MED ORDER — SODIUM CHLORIDE 0.9 % IV SOLN
1.0000 g | Freq: Every day | INTRAVENOUS | Status: DC
  Administered 2023-07-20: 1 g via INTRAVENOUS
  Filled 2023-07-20: qty 10

## 2023-07-20 MED ORDER — PREDNISONE 20 MG PO TABS: 40.0000 mg | ORAL_TABLET | Freq: Every day | ORAL | Status: DC

## 2023-07-20 NOTE — ED Triage Notes (Signed)
 Patient BIB [facility] for SOB for a week. Patient found to be in a-fib rate >200. Patient was cardioverted in the field with a return to SR. Patient administered. 2G mag, 2 duo nebs and 1 albuterol  treatment.

## 2023-07-20 NOTE — ED Notes (Signed)
 CCMD contacted

## 2023-07-20 NOTE — ED Notes (Signed)
 Family at the bedside. MD Martina Sledge made aware of the patients b/p.

## 2023-07-20 NOTE — Progress Notes (Signed)
 PHARMACIST - PHYSICIAN COMMUNICATION  CONCERNING:  Enoxaparin  (Lovenox ) for DVT Prophylaxis    RECOMMENDATION: Patient was prescribed enoxaprin 40mg  q24 hours for VTE prophylaxis.   Filed Weights   07/20/23 2110  Weight: 125.2 kg (276 lb)    Body mass index is 45.93 kg/m.  Estimated Creatinine Clearance: 67.5 mL/min (A) (by C-G formula based on SCr of 1.18 mg/dL (H)).   Based on [facility] policy patient is candidate for enoxaparin  0.5mg /kg TBW SQ every 24 hours based on BMI being >30.  DESCRIPTION: Pharmacy has adjusted enoxaparin  dose per [facility] policy.  Patient is now receiving enoxaparin  0.5 mg/kg every 24 hours   Coretta Dexter, PharmD, [facility] 07/20/2023 11:39 PM

## 2023-07-20 NOTE — ED Provider Notes (Signed)
 [facility] Provider Note    Event Date/Time   First MD Initiated Contact with Patient 07/20/23 2107     (approximate)   History   Shortness of Breath   HPI  Brandi Fowler is a 58 y.o. female with a history of COPD, lung cancer, schizophrenia who presents with respiratory distress.  History per EMS, they report worsening shortness of breath, gave DuoNeb, Solu-Medrol , magnesium     Physical Exam   Triage Vital Signs: ED Triage Vitals  Encounter Vitals Group     BP 07/20/23 2114 (!) 80/52     Systolic BP Percentile --      Diastolic BP Percentile --      Pulse Rate 07/20/23 2114 (!) 106     Resp --      Temp 07/20/23 2114 98.1 F (36.7 C)     Temp Source 07/20/23 2114 Oral     SpO2 07/20/23 2109 98 %     Weight 07/20/23 2110 125.2 kg (276 lb)     Height 07/20/23 2110 1.651 m (5\' 5" )     Head Circumference --      Peak Flow --      Pain Score 07/20/23 2110 10     Pain Loc --      Pain Education --      Exclude from Growth Chart --     Most recent vital signs: Vitals:   07/20/23 2126 07/20/23 2200  BP: (!) 78/60 (!) 91/58  Pulse: (!) 104 (!) 105  Resp: (!) 26 (!) 31  Temp:    SpO2: 97% 98%     General: Awake, nonrebreather in place CV:  Good peripheral perfusion.  Tachycardia Resp:  Increased respiratory effort with tachypnea, scattered wheezes Abd:  No distention.  Other:  No calf pain or swelling   ED Results / Procedures / Treatments   Labs (all labs ordered are listed, but only abnormal results are displayed) Labs Reviewed  CBC - Abnormal; Notable for the following components:      Result Value   Platelets 97 (*)    All other components within normal limits  COMPREHENSIVE METABOLIC PANEL WITH GFR - Abnormal; Notable for the following components:   CO2 18 (*)    Glucose, Bld 161 (*)    BUN 27 (*)    Creatinine, Ser 1.18 (*)    Calcium 8.3 (*)    Albumin 3.0 (*)    GFR, Estimated 53 (*)    All other components  within normal limits  BLOOD GAS, VENOUS - Abnormal; Notable for the following components:   pCO2, Ven 36 (*)    pO2, Ven 98 (*)    All other components within normal limits  CULTURE, BLOOD (ROUTINE X 2)  CULTURE, BLOOD (ROUTINE X 2)  LACTIC ACID, PLASMA  BRAIN NATRIURETIC PEPTIDE  LACTIC ACID, PLASMA  TROPONIN I (HIGH SENSITIVITY)     EKG  ED ECG REPORT I, Bryson Carbine, the attending physician, personally viewed and interpreted this ECG.  Date: 07/20/2023  Rhythm: Sinus tachycardia QRS Axis: normal Intervals: normal ST/T Wave abnormalities: normal Narrative Interpretation: no evidence of acute ischemia    RADIOLOGY Chest x-ray viewed interpret by me, no evidence of pneumonia    PROCEDURES:  Critical Care performed: yes  CRITICAL CARE Performed by: Bryson Carbine   Total critical care time: 30 minutes  Critical care time was exclusive of separately billable procedures and treating other patients.  Critical care was necessary to  treat or prevent imminent or life-threatening deterioration.  Critical care was time spent personally by me on the following activities: development of treatment plan with patient and/or surrogate as well as nursing, discussions with consultants, evaluation of patient's response to treatment, examination of patient, obtaining history from patient or surrogate, ordering and performing treatments and interventions, ordering and review of laboratory studies, ordering and review of radiographic studies, pulse oximetry and re-evaluation of patient's condition.   Procedures   MEDICATIONS ORDERED IN ED: Medications  ipratropium-albuterol  (DUONEB) 0.5-2.5 (3) MG/3ML nebulizer solution 3 mL (3 mLs Nebulization Given 07/20/23 2128)  ipratropium-albuterol  (DUONEB) 0.5-2.5 (3) MG/3ML nebulizer solution 3 mL (3 mLs Nebulization Given 07/20/23 2128)  sodium chloride  0.9 % bolus 500 mL (500 mLs Intravenous New Bag/Given 07/20/23 2129)  sodium chloride   0.9 % bolus 500 mL (500 mLs Intravenous New Bag/Given 07/20/23 2215)     IMPRESSION / MDM / ASSESSMENT AND PLAN / ED COURSE  I reviewed the triage vital signs and the nursing notes. Patient's presentation is most consistent with acute presentation with potential threat to life or bodily function.  Patient with history of lung cancer and COPD presents with respiratory distress, nonrebreather in place, will continue DuoNebs, has received Solu-Medrol  and IV magnesium.  Do not think BiPAP is needed at this time  Labs, chest x-ray, VBG pending  Blood pressure is soft, review of record demonstrates blood pressure was low for 7 days ago at her appointment as well, will give a liter of IV fluids and monitor carefully.  ----------------------------------------- 10:21 PM on 07/20/2023 ----------------------------------------- Blood pressure has improved to 91/60 with 500 cc of fluid, will continue IV fluids.  Lab work is overall reassuring, she is no longer on nonrebreather, tolerating nasal cannula at this time, will consult the hospitalist for admission        FINAL CLINICAL IMPRESSION(S) / ED DIAGNOSES   Final diagnoses:  COPD exacerbation (HCC)  Acute on chronic respiratory failure with hypoxia (HCC)     Rx / DC Orders   ED Discharge Orders     None        Note:  This document was prepared using Dragon voice recognition software and may include unintentional dictation errors.   Bryson Carbine, MD 07/20/23 2223

## 2023-07-21 ENCOUNTER — Inpatient Hospital Stay

## 2023-07-21 ENCOUNTER — Other Ambulatory Visit: Payer: Self-pay

## 2023-07-21 ENCOUNTER — Inpatient Hospital Stay (HOSPITAL_COMMUNITY)
Admit: 2023-07-21 | Discharge: 2023-07-21 | Disposition: A | Discharge status: Home / Self Care | Attending: Cardiovascular Disease | Admitting: Cardiovascular Disease

## 2023-07-21 ENCOUNTER — Ambulatory Visit

## 2023-07-21 ENCOUNTER — Encounter: Payer: Self-pay | Admitting: Internal Medicine

## 2023-07-21 ENCOUNTER — Other Ambulatory Visit (HOSPITAL_COMMUNITY): Payer: Self-pay

## 2023-07-21 ENCOUNTER — Encounter: Payer: Self-pay | Admitting: Family Medicine

## 2023-07-21 ENCOUNTER — Inpatient Hospital Stay: Admitting: Internal Medicine

## 2023-07-21 DIAGNOSIS — J441 Chronic obstructive pulmonary disease with (acute) exacerbation: Secondary | ICD-10-CM | POA: Diagnosis not present

## 2023-07-21 DIAGNOSIS — C3490 Malignant neoplasm of unspecified part of unspecified bronchus or lung: Secondary | ICD-10-CM

## 2023-07-21 DIAGNOSIS — I4891 Unspecified atrial fibrillation: Secondary | ICD-10-CM | POA: Diagnosis not present

## 2023-07-21 DIAGNOSIS — G2581 Restless legs syndrome: Secondary | ICD-10-CM | POA: Insufficient documentation

## 2023-07-21 DIAGNOSIS — I480 Paroxysmal atrial fibrillation: Secondary | ICD-10-CM

## 2023-07-21 DIAGNOSIS — U071 COVID-19: Secondary | ICD-10-CM

## 2023-07-21 DIAGNOSIS — I272 Pulmonary hypertension, unspecified: Secondary | ICD-10-CM | POA: Insufficient documentation

## 2023-07-21 LAB — ECHOCARDIOGRAM COMPLETE
AR max vel: 3.15 cm2
AV Area VTI: 3.64 cm2
AV Area mean vel: 3.09 cm2
AV Mean grad: 4.5 mmHg
AV Peak grad: 7.6 mmHg
Ao pk vel: 1.38 m/s
Area-P 1/2: 4.99 cm2
Height: 65 in
S' Lateral: 2.5 cm
Weight: 4416 [oz_av]

## 2023-07-21 LAB — BASIC METABOLIC PANEL WITH GFR
Anion gap: 10 (ref 5–15)
BUN: 21 mg/dL — ABNORMAL HIGH (ref 6–20)
CO2: 20 mmol/L — ABNORMAL LOW (ref 22–32)
Calcium: 8 mg/dL — ABNORMAL LOW (ref 8.9–10.3)
Chloride: 106 mmol/L (ref 98–111)
Creatinine, Ser: 0.87 mg/dL (ref 0.44–1.00)
GFR, Estimated: >60 mL/min (ref >60)
Glucose, Bld: 153 mg/dL — ABNORMAL HIGH (ref 70–99)
Potassium: 4.1 mmol/L (ref 3.5–5.1)
Sodium: 136 mmol/L (ref 135–145)

## 2023-07-21 LAB — RESPIRATORY PANEL BY PCR

## 2023-07-21 LAB — LACTIC ACID, PLASMA: Lactic Acid, Venous: 1.3 mmol/L (ref 0.5–1.9)

## 2023-07-21 LAB — CBC
HCT: 38.2 % (ref 36.0–46.0)
Hemoglobin: 12.2 g/dL (ref 12.0–15.0)
MCH: 31.8 pg (ref 26.0–34.0)
MCHC: 31.9 g/dL (ref 30.0–36.0)
MCV: 99.5 fL (ref 80.0–100.0)
Platelets: 86 10*3/uL — ABNORMAL LOW (ref 150–400)
RBC: 3.84 MIL/uL — ABNORMAL LOW (ref 3.87–5.11)
RDW: 15.6 % — ABNORMAL HIGH (ref 11.5–15.5)
WBC: 3.2 10*3/uL — ABNORMAL LOW (ref 4.0–10.5)
nRBC: 0 % (ref 0.0–0.2)

## 2023-07-21 LAB — TSH: TSH: 0.43 u[IU]/mL (ref 0.350–4.500)

## 2023-07-21 LAB — HIV ANTIBODY (ROUTINE TESTING W REFLEX): HIV Screen 4th Generation wRfx: NONREACTIVE

## 2023-07-21 LAB — TROPONIN I (HIGH SENSITIVITY)
Troponin I (High Sensitivity): 11 ng/L (ref <18)
Troponin I (High Sensitivity): 11 ng/L (ref <18)

## 2023-07-21 LAB — SARS CORONAVIRUS 2 BY RT PCR: SARS Coronavirus 2 by RT PCR: POSITIVE — AB

## 2023-07-21 LAB — PROTIME-INR
INR: 1 (ref 0.8–1.2)
Prothrombin Time: 13 s (ref 11.4–15.2)

## 2023-07-21 MED ORDER — LEVALBUTEROL TARTRATE 45 MCG/ACT IN AERO
1.0000 | INHALATION_SPRAY | Freq: Four times a day (QID) | RESPIRATORY_TRACT | Status: DC
  Administered 2023-07-22 – 2023-07-25 (×14): 1 via RESPIRATORY_TRACT
  Filled 2023-07-21: qty 1

## 2023-07-21 MED ORDER — DIGOXIN 0.25 MG/ML IJ SOLN
0.2500 mg | Freq: Once | INTRAMUSCULAR | Status: AC
  Administered 2023-07-21: 0.25 mg via INTRAVENOUS
  Filled 2023-07-21: qty 2

## 2023-07-21 MED ORDER — MORPHINE SULFATE (PF) 2 MG/ML IV SOLN
2.0000 mg | INTRAVENOUS | Status: DC | PRN
  Administered 2023-07-21 – 2023-07-27 (×37): 2 mg via INTRAVENOUS
  Filled 2023-07-21 (×38): qty 1

## 2023-07-21 MED ORDER — AMIODARONE HCL IN DEXTROSE 360-4.14 MG/200ML-% IV SOLN
60.0000 mg/h | INTRAVENOUS | Status: AC
  Administered 2023-07-21 (×2): 60 mg/h via INTRAVENOUS
  Filled 2023-07-21 (×2): qty 200

## 2023-07-21 MED ORDER — ENSIFENTRINE 3 MG/2.5ML IN SUSP
1.0000 | Freq: Two times a day (BID) | RESPIRATORY_TRACT | Status: DC
  Administered 2023-07-21 – 2023-07-27 (×12): 1 via RESPIRATORY_TRACT
  Filled 2023-07-21 (×13): qty 1

## 2023-07-21 MED ORDER — CHLORHEXIDINE GLUCONATE CLOTH 2 % EX PADS
6.0000 | MEDICATED_PAD | Freq: Every day | CUTANEOUS | Status: DC
  Administered 2023-07-22 – 2023-07-27 (×6): 6 via TOPICAL
  Filled 2023-07-21: qty 6

## 2023-07-21 MED ORDER — IOHEXOL 350 MG/ML SOLN
100.0000 mL | Freq: Once | INTRAVENOUS | Status: AC | PRN
  Administered 2023-07-21: 100 mL via INTRAVENOUS

## 2023-07-21 MED ORDER — DILTIAZEM HCL 25 MG/5ML IV SOLN
10.0000 mg | Freq: Once | INTRAVENOUS | Status: AC
  Administered 2023-07-21: 10 mg via INTRAVENOUS
  Filled 2023-07-21: qty 5

## 2023-07-21 MED ORDER — BUDESON-GLYCOPYRROL-FORMOTEROL 160-9-4.8 MCG/ACT IN AERO
2.0000 | INHALATION_SPRAY | Freq: Two times a day (BID) | RESPIRATORY_TRACT | Status: DC
  Administered 2023-07-21 – 2023-07-27 (×12): 2 via RESPIRATORY_TRACT
  Filled 2023-07-21: qty 5.9

## 2023-07-21 MED ORDER — HEPARIN BOLUS VIA INFUSION
4300.0000 [IU] | Freq: Once | INTRAVENOUS | Status: DC
  Filled 2023-07-21: qty 4300

## 2023-07-21 MED ORDER — DIPHENHYDRAMINE HCL 50 MG/ML IJ SOLN
50.0000 mg | Freq: Once | INTRAMUSCULAR | Status: AC
  Administered 2023-07-21: 50 mg via INTRAVENOUS
  Filled 2023-07-21: qty 1

## 2023-07-21 MED ORDER — LEVALBUTEROL HCL 1.25 MG/0.5ML IN NEBU
1.2500 mg | INHALATION_SOLUTION | Freq: Four times a day (QID) | RESPIRATORY_TRACT | Status: DC
  Administered 2023-07-21: 1.25 mg via RESPIRATORY_TRACT
  Filled 2023-07-21: qty 0.5

## 2023-07-21 MED ORDER — APIXABAN 5 MG PO TABS
5.0000 mg | ORAL_TABLET | Freq: Two times a day (BID) | ORAL | Status: DC
Start: 2023-07-21 — End: 2023-07-27
  Administered 2023-07-21 – 2023-07-27 (×13): 5 mg via ORAL
  Filled 2023-07-21 (×13): qty 1

## 2023-07-21 MED ORDER — DIPHENHYDRAMINE HCL 25 MG PO CAPS: 50.0000 mg | ORAL_CAPSULE | Freq: Once | ORAL | Status: AC

## 2023-07-21 MED ORDER — ROFLUMILAST 500 MCG PO TABS
500.0000 ug | ORAL_TABLET | ORAL | Status: DC
  Administered 2023-07-22 – 2023-07-27 (×6): 500 ug via ORAL
  Filled 2023-07-21 (×7): qty 1

## 2023-07-21 MED ORDER — SODIUM CHLORIDE 0.9% FLUSH
10.0000 mL | Freq: Two times a day (BID) | INTRAVENOUS | Status: DC
  Administered 2023-07-21 – 2023-07-27 (×13): 10 mL

## 2023-07-21 MED ORDER — AMIODARONE HCL IN DEXTROSE 360-4.14 MG/200ML-% IV SOLN
30.0000 mg/h | INTRAVENOUS | Status: DC
  Administered 2023-07-21 – 2023-07-23 (×5): 30 mg/h via INTRAVENOUS
  Filled 2023-07-21 (×4): qty 200

## 2023-07-21 MED ORDER — AMIODARONE LOAD VIA INFUSION
150.0000 mg | Freq: Once | INTRAVENOUS | Status: AC
  Administered 2023-07-21: 150 mg via INTRAVENOUS
  Filled 2023-07-21: qty 83.34

## 2023-07-21 MED ORDER — SODIUM CHLORIDE 0.9% FLUSH: 10.0000 mL | INTRAVENOUS | Status: DC | PRN

## 2023-07-21 MED ORDER — METHYLPREDNISOLONE SODIUM SUCC 40 MG IJ SOLR
40.0000 mg | Freq: Once | INTRAMUSCULAR | Status: AC
  Administered 2023-07-21: 40 mg via INTRAVENOUS
  Filled 2023-07-21: qty 1

## 2023-07-21 MED ORDER — IPRATROPIUM BROMIDE HFA 17 MCG/ACT IN AERS
2.0000 | INHALATION_SPRAY | Freq: Four times a day (QID) | RESPIRATORY_TRACT | Status: DC
  Administered 2023-07-21 – 2023-07-27 (×24): 2 via RESPIRATORY_TRACT
  Filled 2023-07-21: qty 12.9

## 2023-07-21 MED ORDER — IPRATROPIUM BROMIDE 0.02 % IN SOLN
0.5000 mg | Freq: Four times a day (QID) | RESPIRATORY_TRACT | Status: DC
  Administered 2023-07-21: 0.5 mg via RESPIRATORY_TRACT
  Filled 2023-07-21 (×2): qty 2.5

## 2023-07-21 MED ORDER — LEVALBUTEROL HCL 1.25 MG/0.5ML IN NEBU
1.2500 mg | INHALATION_SOLUTION | RESPIRATORY_TRACT | Status: DC
  Administered 2023-07-21: 1.25 mg via RESPIRATORY_TRACT
  Filled 2023-07-21: qty 0.5

## 2023-07-21 MED ORDER — HEPARIN (PORCINE) 25000 UT/250ML-% IV SOLN: 1300.0000 [IU]/h | INTRAVENOUS | Status: DC

## 2023-07-21 MED ORDER — AMIODARONE IV BOLUS ONLY 150 MG/100ML
150.0000 mg | Freq: Once | INTRAVENOUS | Status: AC
  Administered 2023-07-21: 150 mg via INTRAVENOUS
  Filled 2023-07-21: qty 100

## 2023-07-21 NOTE — Consult Note (Signed)
 Cardiology Consultation   Patient ID: Brandi Fowler MRN: [id number]; DOB: 01/06/1966  Admit date: 07/20/2023 Date of Consult: 07/21/2023  PCP:  [facility], [facility]   [facility] Providers Cardiologist:  None      New consult completed by Dr Jerelene Monday  Patient Profile:   Brandi Fowler is a 58 y.o. female with a hx of COPD oxygen  dependent, lung cancer, schizophrenia, GERD, urolithiasis, OSA, seizures, PVD, PTSD, depression, degenerative joint disease, current smoker, with morbid obesity, who is being seen 07/21/2023 for the evaluation of atrial fibrillation with RVR at the request of Dr Achilles Holes.  History of Present Illness:   Ms. [name] presented to the [facility] emergency room on 07/20/2023 via EMS for shortness of breath for the last week.  When EMS arrived she was found to be in atrial fibrillation with a rate of greater than 200.  Patient was cardioverted in the field and returned to sinus rhythm.  She was administered 2 g of magnesium, 2 DuoNebs, and 1 albuterol  treatment.  She stated she had gone in for chemo last week and was sent to the emergency department for the same symptoms was treated and was discharged.  She continues to have shortness of breath, weakness, palpitations, lightheadedness and dizziness.  States she occasionally had chest discomfort associated with cough.  On arrival to the emergency department she was in sinus tachycardia with a rate of 114 with PVCs.  Unfortunately she had converted back into atrial fibrillation with RVR around 414 this morning with rates of 160-180 and was subsequently started on an amiodarone infusion.    Initial vital signs: Blood pressure 80/52, pulse of 106, temperature of 98.1  Pertinent labs: Platelets of 97, CO2 of 18, blood glucose 161, BUN 27, serum creatinine 1.18, calcium 8.3, albumin at 3.0, BNP 382.4, high-sensitivity troponin 15, COVID-positive by PCR, TSH 0.430  Imaging: Chest x-ray revealed stable cardiomegaly  with mild bibasilar atelectasis  Medications administered in the emergency department: DuoNeb x 2, normal saline bolus 1 L, amiodarone bolus and amiodarone infusion  Cardiology consulted for new onset atrial fibrillation with RVR   Past Medical History:  Diagnosis Date   BRBPR (bright red blood per rectum)    CAP (community acquired pneumonia)    Chronic diarrhea    Chronic kidney disease    Chronic myofascial pain    Chronic pain    Chronic venous insufficiency    Cigarette smoker    COPD (chronic obstructive pulmonary disease) (HCC)    Coronary artery disease    DDD (degenerative disc disease), cervical    DDD (degenerative disc disease), thoracic    Depression    Elevated blood pressure reading without diagnosis of hypertension    Elevated hemoglobin A1c    Female hirsutism    GERD (gastroesophageal reflux disease)    Headache    migraines   History of adenomatous polyp of colon    History of kidney stones    History of sepsis    Kidney stones    Lung cancer (HCC)    Lymphedema    Mediastinal adenopathy    Morbid obesity (HCC)    Obesity, Class III, BMI 40-49.9 (morbid obesity)    OSA (obstructive sleep apnea)    Paranoid schizophrenia (HCC)    Peripheral vascular disease (HCC)    Primary cancer of left upper lobe of lung (HCC)    PTSD (post-traumatic stress disorder)    RLS (restless legs syndrome)    Seizures (HCC)  last seizure in Jan 2025   Sleep apnea    Supplemental oxygen  dependent    2-3 L [place]   Syncope     Past Surgical History:  Procedure Laterality Date   CERVICAL SPINE SURGERY     Fusion C6-7   CHOLECYSTECTOMY     COLONOSCOPY WITH PROPOFOL  N/A 11/18/2014   Procedure: COLONOSCOPY WITH PROPOFOL ;  Surgeon: Cassie Click, MD;  Location: [facility] ENDOSCOPY;  Service: Endoscopy;  Laterality: N/A;   ESOPHAGOGASTRODUODENOSCOPY     FLEXIBLE BRONCHOSCOPY N/A 05/13/2023   Procedure: BRONCHOSCOPY, FLEXIBLE;  Surgeon: Erskin Hearing, MD;  Location: [facility]  ORS;  Service: Thoracic;  Laterality: N/A;   IR IMAGING GUIDED PORT INSERTION  06/03/2023   KNEE SURGERY Left    polpectomy N/A    TUBAL LIGATION     VIDEO BRONCHOSCOPY WITH ENDOBRONCHIAL ULTRASOUND N/A 05/13/2023   Procedure: BRONCHOSCOPY, WITH EBUS;  Surgeon: Erskin Hearing, MD;  Location: [facility] ORS;  Service: Thoracic;  Laterality: N/A;     Home Medications:  Prior to Admission medications   Medication Sig Start Date End Date Taking? Authorizing Provider  albuterol  (ACCUNEB ) 1.25 MG/3ML nebulizer solution Take 1 ampule by nebulization every 6 (six) hours as needed for wheezing.   Yes [provider]  albuterol  (VENTOLIN  HFA) 108 (90 Base) MCG/ACT inhaler Inhale 2 puffs into the lungs every 4 (four) hours as needed.   Yes [provider]  Ensifentrine  (OHTUVAYRE ) 3 MG/2.5ML SUSP Inhale 1 vial into the lungs in the morning and at bedtime.   Yes [provider]  oxyCODONE  (ROXICODONE ) 15 MG immediate release tablet Take 15 mg by mouth in the morning, at noon, in the evening, and at bedtime.   Yes [provider]  sulfamethoxazole-trimethoprim (BACTRIM) 400-80 MG tablet Take 1 tablet by mouth 3 (three) times a week.   Yes [provider]  tiZANidine  (ZANAFLEX ) 4 MG tablet Take 4 mg by mouth every 6 (six) hours as needed for muscle spasms. 06/28/23  Yes [provider]  Benzocaine , Topical, 20 % OINT Apply topically to port site 1-2 hours prior to port access. Patient not taking: Reported on 07/11/2023 06/01/23   Brahmanday, Govinda R, MD  diphenhydrAMINE  (BENADRYL ) 50 MG tablet Take 1 tablet (50 mg) by mouth 1 hour prior to CT scan due to contrast allergy. May cause drowsiness Patient not taking: Reported on 07/21/2023 07/12/23   Nelda Balsam, NP  EPINEPHrine 0.3 mg/0.3 mL IJ SOAJ injection Inject 0.3 mg into the muscle as needed for anaphylaxis. Patient not taking: Reported on 07/11/2023    [provider]  naloxone  (NARCAN ) nasal spray 4  mg/0.1 mL CALL 911. INSTILL 1 SPRAY IN ONE NOSTRIL. MAY REPEAT Q 2 TO 3 MINUTES IF SYMPTOMS OF AN OPIOID EMERGENCY PERSISTS. ALTERNATE NOSTRILS. Patient not taking: Reported on 07/11/2023 07/13/16   [provider]  OXYGEN  Inhale 2-3 L into the lungs continuous.    [provider]  predniSONE  (DELTASONE ) 5 MG tablet Take 1 tablet by mouth daily with breakfast. Patient not taking: Reported on 07/21/2023 08/31/22   [provider]  predniSONE  (DELTASONE ) 50 MG tablet Take one tablet (50 mg) by mouth 13, 7, and 1 hour prior to imaging. For allergy due to contrast dye Patient not taking: Reported on 07/21/2023 07/12/23   Nelda Balsam, NP  prochlorperazine  (COMPAZINE ) 10 MG tablet Take 1 tablet (10 mg total) by mouth every 6 (six) hours as needed for nausea or vomiting. Patient not taking: Reported on 07/11/2023  05/24/23   Brahmanday, Govinda R, MD  roflumilast (DALIRESP) 500 MCG TABS tablet Take 1 tablet by mouth every morning.    [provider]  rOPINIRole  (REQUIP ) 0.5 MG tablet Take 1 tablet (0.5 mg total) by mouth at bedtime. Patient taking differently: Take 1 mg by mouth at bedtime. 06/22/18 06/17/23  Brown, Fallon E, NP  TRELEGY ELLIPTA 100-62.5-25 MCG/ACT AEPB Inhale 1 puff into the lungs every morning.    [provider]  triamcinolone  cream (KENALOG ) 0.1 % Apply 1 Application topically 2 (two) times daily. Patient not taking: Reported on 06/30/2023 06/21/23   Nelda Balsam, NP    Inpatient Medications: Scheduled Meds:  apixaban  5 mg Oral BID   Chlorhexidine  Gluconate Cloth  6 each Topical Daily   digoxin  0.25 mg Intravenous Once   diphenhydrAMINE   50 mg Oral Once   Or   diphenhydrAMINE   50 mg Intravenous Once   guaiFENesin   600 mg Oral BID   ipratropium  0.5 mg Nebulization Q6H   levalbuterol   1.25 mg Nebulization Q6H   rOPINIRole   1 mg Oral QHS   sodium chloride  flush  10-40 mL Intracatheter Q12H   Continuous Infusions:  sodium chloride  100  mL/hr at 07/21/23 1139   amiodarone 30 mg/hr (07/21/23 1138)   cefTRIAXone  (ROCEPHIN )  IV Stopped (07/21/23 0116)   PRN Meds: acetaminophen  **OR** acetaminophen , magnesium hydroxide, morphine  injection, oxyCODONE , sodium chloride  flush, tiZANidine , traZODone   Allergies:    Allergies  Allergen Reactions   Aripiprazole Swelling    Other Reaction(s): Other (See Comments)  Other reaction(s): Edema   Baclofen Swelling   Buprenorphine-Naloxone  Other (See Comments)    Other reaction(s): Other (See Comments), Other (See Comments), seizure  SEIZURE   Clonazepam Rash   Cyclobenzaprine Nausea And Vomiting and Swelling    Other reaction(s): swelling, vomiting   Doxepin Rash   Fentanyl  Nausea And Vomiting    Other reaction(s): Other (See Comments)   Lurasidone Swelling and Nausea Only    Other reaction(s): unknown   Methadone Swelling    Other reaction(s): Angioedema, n/v   Oxymorphone Other (See Comments)    Other reaction(s): Other (See Comments), Other (See Comments), Other (See Comments), seizure  SEIZURE  seizures   Penicillins Swelling    Other reaction(s): Throat swells  Other reaction(s): Edema, Other (See Comments), UNKNOWN   Pregabalin  Nausea Only and Swelling   Quetiapine Other (See Comments), Diarrhea and Nausea Only    Other reaction(s): Other (See Comments), swelling   Tolmetin Rash   Trazodone  Itching and Hives   Acetaminophen  Nausea And Vomiting, Diarrhea and Nausea Only    Other reaction(s): diarrhea, nausea, vomiting, Other (See Comments)   Bupropion Other (See Comments)    Other reaction(s): Abdominal Pain   Colestipol     Other reaction(s): blood in urine, tingling in hands, feet, legs, Other (See Comments)  Urine in blood tingling in hands feet legs   Gabapentin Nausea Only   Hydrocodone Itching and Nausea Only    Other reaction(s): rash, swelling, vomiting   Paroxetine Nausea Only    Other reaction(s): unknown   Sertraline Diarrhea and Nausea And Vomiting    Amitriptyline Hcl    Carbamazepine Hives   Ciprofloxacin Hcl Itching   Codeine    Divalproex  Sodium Diarrhea   Etodolac Swelling   Haloperidol      Other reaction(s): Headache   Hyaluronate Sodium    Hydrochlorothiazide     unknown   Ibuprofen Other (See Comments)    Bleeding in  stomach   Indocin [Indomethacin]    Ketoprofen    Latuda [Lurasidone Hcl]    Lidocaine  Hives   Meloxicam    Metformin And Related     unknown   Methocarbamol      unknown   Nabumetone    Naproxen Hives and Nausea Only   Ondansetron     Opana [Oxymorphone Hcl] Other (See Comments)    seizures   Oxaprozin Hives   Perphenazine  Hives   Risperidone  And Related Itching   Seroquel [Quetiapine Fumarate]    Silver    Suboxone [Buprenorphine Hcl-Naloxone  Hcl] Other (See Comments)    seizures   Tramadol    Valproic Acid Diarrhea   Zoloft [Sertraline Hcl] Nausea And Vomiting   Bromfenac Rash   Buprenorphine Rash   Ciprofloxacin Itching and Other (See Comments)    Other reaction(s): Unknown   Clindamycin Rash   Clindamycin/Lincomycin Rash   Flurbiprofen Swelling and Rash   Metronidazole     Other reaction(s): blood in urine, tingling in hands, legs, feet, Other (See Comments)  Blood in urine tingling in hands feet legs   Mirtazapine Itching    Other reaction(s): unknown   Other Itching and Rash    Patient reports itching and rash after oral contrast. No issues with IV contrast in the past. She has not required pre-meds (07/12/23)    Social History:   Social History   Socioeconomic History   Marital status: Married    Spouse name: Not on file   Number of children: Not on file   Years of education: Not on file   Highest education level: Not on file  Occupational History   Not on file  Tobacco Use   Smoking status: Every Day    Current packs/day: 0.25    Average packs/day: 0.3 packs/day for 45.0 years (11.3 ttl pk-yrs)    Types: Cigarettes   Smokeless tobacco: Former  Building services engineer  status: Never Used  Substance and Sexual Activity   Alcohol use: Not Currently    Alcohol/week: 1.0 standard drink of alcohol    Types: 1 Standard drinks or equivalent per week   Drug use: No   Sexual activity: Yes  Other Topics Concern   Not on file  Social History Narrative   Not on file   Social Drivers of Health   Financial Resource Strain: Low Risk  (03/29/2023)   Received from [facility]   Overall Financial Resource Strain (CARDIA)    Difficulty of Paying Living Expenses: Not hard at all  Food Insecurity: No Food Insecurity (07/21/2023)   Hunger Vital Sign    Worried About Running Out of Food in the Last Year: Never true    Ran Out of Food in the Last Year: Never true  Transportation Needs: No Transportation Needs (07/21/2023)   PRAPARE - Administrator, Civil Service (Medical): No    Lack of Transportation (Non-Medical): No  Physical Activity: Insufficiently Active (08/31/2018)   Exercise Vital Sign    Days of Exercise per Week: 7 days    Minutes of Exercise per Session: 10 min  Stress: Stress Concern Present (08/31/2018)   [employer] of Occupational Health - Occupational Stress Questionnaire    Feeling of Stress : Very much  Social Connections: Moderately Isolated (07/21/2023)   Social Connection and Isolation Panel [NHANES]    Frequency of Communication with Friends and Family: More than three times a week    Frequency of Social Gatherings with Friends  and Family: Once a week    Attends Religious Services: Never    Active Member of Clubs or Organizations: No    Attends Banker Meetings: Never    Marital Status: Living with partner  Intimate Partner Violence: Not At Risk (07/21/2023)   Humiliation, Afraid, Rape, and Kick questionnaire    Fear of Current or Ex-Partner: No    Emotionally Abused: No    Physically Abused: No    Sexually Abused: No    Family History:   Family History  Problem Relation Age of Onset    Alcohol abuse Mother    Cancer Mother    COPD Mother    Hearing loss Mother    Vision loss Mother    Alcohol abuse Father    Depression Father    Early death Father    Mental illness Father      ROS:  Please see the history of present illness.  Review of Systems  Constitutional:  Positive for diaphoresis and malaise/fatigue.  Respiratory:  Positive for cough, sputum production, shortness of breath and wheezing.   Cardiovascular:  Positive for palpitations, orthopnea and PND.  Neurological:  Positive for dizziness and weakness.    All other ROS reviewed and negative.     Physical Exam/Data:   Vitals:   07/21/23 0700 07/21/23 0739 07/21/23 0945 07/21/23 1022  BP: 94/80 108/76 112/84   Pulse: 83  80   Resp: 19 (!) 21 (!) 27   Temp:    98.3 F (36.8 C)  TempSrc:    Oral  SpO2: 96% 96% 91%   Weight:      Height:        Intake/Output Summary (Last 24 hours) at 07/21/2023 1141 Last data filed at 07/21/2023 1139 Gross per 24 hour  Intake 844.05 ml  Output --  Net 844.05 ml      07/20/2023    9:10 PM 07/14/2023    9:43 AM 07/14/2023    8:39 AM  Last 3 Weights  Weight (lbs) 276 lb 273 lb 276 lb 6.4 oz  Weight (kg) 125.193 kg 123.832 kg 125.374 kg     Body mass index is 45.93 kg/m.  General: Chronically ill-appearing in no acute respiratory distress HEENT: normal Neck: Unable to determine JVD due to body habitus Vascular: No carotid bruits; Distal pulses 2+ bilaterally Cardiac:  normal S1, S2; IR IR; no murmur, tachycardic Lungs: Coarse with diffuse wheezing to auscultation bilaterally, respirations are labored at rest on 3 L of O2 via nasal cannula Abd: soft, nontender, obese, no hepatomegaly  Ext: Trace pretibial edema Musculoskeletal:  No deformities, BUE and BLE strength normal and equal Skin: warm and dry  Neuro:  CNs 2-12 intact, no focal abnormalities noted Psych:  Normal affect   EKG:  The EKG was personally reviewed and demonstrates: Sinus tachycardia with a  rate of 114 with occasional PACs Telemetry:  Telemetry was personally reviewed and demonstrates: Atrial fibrillation with ventricular rates of 164 bpm  Relevant CV Studies: Echocardiogram ordered and pending  Laboratory Data:  High Sensitivity Troponin:   Recent Labs  Lab 07/14/23 1020 07/20/23 2113 07/21/23 0450 07/21/23 0632  TROPONINIHS 5 15 11 11      Chemistry Recent Labs  Lab 07/20/23 2113 07/21/23 0450  NA 135 136  K 3.9 4.1  CL 103 106  CO2 18* 20*  GLUCOSE 161* 153*  BUN 27* 21*  CREATININE 1.18* 0.87  CALCIUM 8.3* 8.0*  GFRNONAA 53* >60  ANIONGAP 14 10  Recent Labs  Lab 07/20/23 2113  PROT 6.6  ALBUMIN 3.0*  AST 30  ALT 39  ALKPHOS 67  BILITOT 0.8   Lipids No results for input(s): "CHOL", "TRIG", "HDL", "LABVLDL", "LDLCALC", "CHOLHDL" in the last 168 hours.  Hematology Recent Labs  Lab 07/20/23 2113 07/21/23 0450  WBC 4.7 3.2*  RBC 3.87 3.84*  HGB 12.4 12.2  HCT 36.5 38.2  MCV 94.3 99.5  MCH 32.0 31.8  MCHC 34.0 31.9  RDW 15.5 15.6*  PLT 97* 86*   Thyroid  Recent Labs  Lab 07/21/23 0930  TSH 0.430    BNP Recent Labs  Lab 07/20/23 2113  BNP 382.4*    DDimer No results for input(s): "DDIMER" in the last 168 hours.   Radiology/Studies:  ECHOCARDIOGRAM COMPLETE Result Date: 07/21/2023    ECHOCARDIOGRAM REPORT   Patient Name:   JAKIYA SWOVELAND Date of Exam: 07/21/2023 Medical Rec #:  [id number]        Height:       65.0 in Accession #:    [id number]       Weight:       276.0 lb Date of Birth:  August 18, 1965        BSA:          2.268 m Patient Age:    58 years         BP:           108/76 mmHg Patient Gender: F                HR:           83 bpm. Exam Location:  [facility] Procedure: 2D Echo, Cardiac Doppler and Color Doppler (Both Spectral and Color            Flow Doppler were utilized during procedure). Indications:     Atrial Fibrillation I48.91  History:         Patient has no prior history of Echocardiogram examinations.                   COPD. Lung cancer.  Sonographer:     Broadus Canes Referring Phys:  [id number] Devorah Fonder Diagnosing Phys: Belva Boyden MD  Sonographer Comments: Technically challenging study due to limited acoustic windows, suboptimal parasternal window and suboptimal apical window. IMPRESSIONS  1. Left ventricular ejection fraction, by estimation, is 60 to 65%. The left ventricle has normal function. The left ventricle has no regional wall motion abnormalities. Left ventricular diastolic parameters are indeterminate.  2. Right ventricular systolic function is normal. The right ventricular size is normal. There is normal pulmonary artery systolic pressure. The estimated right ventricular systolic pressure is 20.1 mmHg.  3. The mitral valve is normal in structure. No evidence of mitral valve regurgitation. No evidence of mitral stenosis.  4. The aortic valve is normal in structure. Aortic valve regurgitation is not visualized. No aortic stenosis is present.  5. The inferior vena cava is dilated in size with >50% respiratory variability, suggesting right atrial pressure of 8 mmHg.  6. Rhythm is atrial fibrillation with RVR FINDINGS  Left Ventricle: Left ventricular ejection fraction, by estimation, is 60 to 65%. The left ventricle has normal function. The left ventricle has no regional wall motion abnormalities. Strain was performed and the global longitudinal strain is indeterminate. The left ventricular internal cavity size was normal in size. There is no left ventricular hypertrophy. Left ventricular diastolic parameters are indeterminate. Right Ventricle: The right ventricular size is normal.  No increase in right ventricular wall thickness. Right ventricular systolic function is normal. There is normal pulmonary artery systolic pressure. The tricuspid regurgitant velocity is 1.94 m/s, and  with an assumed right atrial pressure of 5 mmHg, the estimated right ventricular systolic pressure is 20.1 mmHg. Left Atrium: Left atrial size  was normal in size. Right Atrium: Right atrial size was normal in size. Pericardium: There is no evidence of pericardial effusion. Mitral Valve: The mitral valve is normal in structure. No evidence of mitral valve regurgitation. No evidence of mitral valve stenosis. Tricuspid Valve: The tricuspid valve is normal in structure. Tricuspid valve regurgitation is mild . No evidence of tricuspid stenosis. Aortic Valve: The aortic valve is normal in structure. Aortic valve regurgitation is not visualized. No aortic stenosis is present. Aortic valve mean gradient measures 4.5 mmHg. Aortic valve peak gradient measures 7.6 mmHg. Aortic valve area, by VTI measures 3.64 cm. Pulmonic Valve: The pulmonic valve was normal in structure. Pulmonic valve regurgitation is not visualized. No evidence of pulmonic stenosis. Aorta: The aortic root is normal in size and structure. Venous: The inferior vena cava is dilated in size with greater than 50% respiratory variability, suggesting right atrial pressure of 8 mmHg. IAS/Shunts: No atrial level shunt detected by color flow Doppler. Additional Comments: 3D was performed not requiring image post processing on an independent workstation and was indeterminate.  LEFT VENTRICLE PLAX 2D LVIDd:         3.50 cm LVIDs:         2.50 cm LV PW:         0.90 cm LV IVS:        1.20 cm LVOT diam:     2.10 cm LV SV:         66 LV SV Index:   29 LVOT Area:     3.46 cm  LEFT ATRIUM           Index LA Vol (A2C): 43.2 ml 19.04 ml/m LA Vol (A4C): 40.4 ml 17.81 ml/m  AORTIC VALVE AV Area (Vmax):    3.15 cm AV Area (Vmean):   3.09 cm AV Area (VTI):     3.64 cm AV Vmax:           137.50 cm/s AV Vmean:          91.900 cm/s AV VTI:            0.181 m AV Peak Grad:      7.6 mmHg AV Mean Grad:      4.5 mmHg LVOT Vmax:         125.00 cm/s LVOT Vmean:        82.100 cm/s LVOT VTI:          0.190 m LVOT/AV VTI ratio: 1.05 MITRAL VALVE                TRICUSPID VALVE MV Area (PHT): 4.99 cm     TR Peak grad:   15.1  mmHg MV Decel Time: 152 msec     TR Vmax:        194.00 cm/s MV E velocity: 139.00 cm/s                             SHUNTS                             Systemic VTI:  0.19 m  Systemic Diam: 2.10 cm Belva Boyden MD Electronically signed by Belva Boyden MD Signature Date/Time: 07/21/2023/10:59:25 AM    Final    DG Chest Port 1 View Result Date: 07/20/2023 CLINICAL DATA:  Shortness of breath. EXAM: PORTABLE CHEST 1 VIEW COMPARISON:  Jul 14, 2023 FINDINGS: There stable right-sided venous Port-A-Cath positioning. The cardiac silhouette is mildly enlarged and unchanged in size. Mild atelectatic changes are noted within the bilateral lung bases. No pleural effusion or pneumothorax is identified. The visualized skeletal structures are unremarkable. IMPRESSION: Stable cardiomegaly with mild bibasilar atelectasis. Electronically Signed   By: Virgle Grime M.D.   On: 07/20/2023 21:43     Assessment and Plan:   New onset atrial fibrillation with RVR -Patient presented after a cardioversion in the field by EMS with atrial fibrillation with rates of 200 -Maintain sinus rhythm and sinus tach until approximately 415 this morning where she converted back into atrial fibrillation with RVR -Started on IV amiodarone infusion overnight in the ED -Additional IV amiodarone bolus ordered this morning -Patient was started on apixaban 5 mg twice daily for CHA2DS2-VASc score of at least 2 for stroke prophylaxis, initially was going to start on IV heparin  infusion but there was IV access issues - Echocardiogram ordered and pending with further recommendations to follow -TSH 0.430 -Ordered digoxin 0.25 mg IV once, received 1 dose of dig earlier in the emergency department -If continues to have elevated heart rates consider starting diltiazem drip with amiodarone infusion - Large component driven by respiratory status -Continue with telemetry monitoring -BNP elevated a little over 300,  consider a dose of IV Lasix  if kidney function remains stable after CTA  Atypical chest pain -Pleuritic in nature -CTA of the chest PE protocol on 5 6 negative -CT ordered for possible dissection by primary team -High-sensitivity troponins negative -No ischemic changes concerning on EKG -Echocardiogram ordered to assess for wall motion abnormality -Not consistent with ACS -EKG is negative for pain or changes  Acute COPD exacerbation -Continue steroids - DuoNebs changed to Xopenex  -Continue on O2 3 L via nasal cannula -Supportive care  Acute hypoxic respiratory failure -Continue O2 via nasal cannula -Continue breathing treatments -COVID-positive per PCR - Supportive care  Stage III SCC lung cancer -S/p radiation currently on chemo that has been held -Continue to follow with outpatient oncology -Recommend palliative care consultation  Morbid obesity - BMI 45.93 - Complicates prognoses   Risk Assessment/Risk Scores:          CHA2DS2-VASc Score = 2   This indicates a 2.2% annual risk of stroke. The patient's score is based upon: CHF History: 0 HTN History: 0 Diabetes History: 0 Stroke History: 0 Vascular Disease History: 1 Age Score: 0 Gender Score: 1          For questions or updates, please contact [facility] Please consult [email or web address] for contact info under    Signed, Trejan Buda, NP  07/21/2023 11:41 AM

## 2023-07-21 NOTE — Progress Notes (Signed)
*  PRELIMINARY RESULTS* Echocardiogram 2D Echocardiogram has been performed.  Brandi Fowler 07/21/2023, 9:11 AM

## 2023-07-21 NOTE — Assessment & Plan Note (Addendum)
-   The patient will be admitted to a medically monitored bed. - We will place the patient IV steroid therapy with IV Solu-Medrol as well as nebulized bronchodilator therapy with duonebs q.i.d. and q.4 hours p.r.n.Marland Kitchen - Mucolytic therapy will be provided with Mucinex and antibiotic therapy with IV Rocephin. - O2 protocol will be followed. - We will hold off long-acting beta agonist.

## 2023-07-21 NOTE — Assessment & Plan Note (Addendum)
 Will continue Requip

## 2023-07-21 NOTE — Assessment & Plan Note (Signed)
-   Will continue Daliresp.

## 2023-07-21 NOTE — Assessment & Plan Note (Addendum)
-   The patient Was successfully cardioverted to sinus rhythm. - We will optimize electrolytes. - 2D echo will be obtained. - Cardiology consult will be obtained. - I notified [facility] group with the patient.

## 2023-07-21 NOTE — ED Notes (Signed)
 Patient converted from NSR/ST in low 100's to a HR ranging from 157-181 at 0415. MD [facility] and ED charge RN notified. On assessment, patient was woken from sleep and complaining of substernal chest pain and mild SOB. EKG/repeat labs/additional IV access performed. Patient relocated to ED main for increased cardiac monitoring.

## 2023-07-21 NOTE — Progress Notes (Addendum)
 PROGRESS NOTE    Brandi Fowler  AOZ:[id number] DOB: [birth year] DOA: 07/20/2023 PCP: [facility]  Outpatient Specialists: oncology, pulmonology    Brief Narrative:   From admission h and p  Brandi Fowler is a 58 y.o. Caucasian female with medical history significant for COPD, lung cancer, schizophrenia, GERD, urolithiasis, OSA, seizures, PVD, PTSD, depression, and DDD, who presented to the emergency room with  acute onset of worsening dyspnea with associated cough productive of clear sputum and occipital wheezing over the last week.  She has been experiencing chest pain with cough with occasional palpitations.  On arrival to the hospital he was in atrial fibrillation with RVR 200.  He was cardioverted by EMS.  No fever or chills.  No nausea or vomiting or abdominal pain.  No dysuria, oliguria or hematuria or flank pain.  Patient was reportedly in atrial fibrillation with rapid ventricular sponsor more than 200.  She was cardioverted in the field by EMS and return to sinus rhythm.  She was given 2 g of IV magnesium sulfate, 2 DuoNebs and 1 albuterol  treatment.     Assessment & Plan:   Principal Problem:   COPD exacerbation (HCC) Active Problems:   Paroxysmal atrial fibrillation with RVR (HCC)   Seizure disorder (HCC)   Schizophrenia (HCC)   Obesity   Obstructive sleep apnea syndrome   Chronic pain   Lung cancer (HCC)   Pulmonary hypertension (HCC)   Restless leg syndrome  # A-fib with RVR Cardioverted with EMS, remains in RVR, hemodynamically stable for now. Is on amio currently. Anticoagulation hasn't been ordered despite cardioversion and amio - cardiology to see - start heparin  - TTE pending - will hold on lasix  given pending CT with contrast - TSH  # Substernal chest pain Pleuritic in nature, severe, present for over a week, had negative CTA PE protocol on 5/6. Could this be 2/2 radiation? Pulm ([place]) thinks may also be 2/2 mass effect from  malignancy - will check CT dissection protocol to r/o dissection though overall think unlikely given negative CT a week ago, will also assess for PE though again unlikely given negative CT last week  # COPD  With exacerbation. Nothing focal on cxr - continue steroids - check respiratory swabs - continue ceftriaxone  - stop duonebs, start just ipratropium (given rvr) - home daliresp and trelegy on hold  # Chronic hypoxic respiratory failure On 2 liters at home, here stable on 3 - continue [place] O2  # Stage 3 SCC lung cancer S/p radiation, currently on chemo that has been held - outpt oncology f/u  # Morbid obesity Noted  # Cytopenias Leukopenia and thrombocytopenia, suspect 2/2 chemo - trend for now  # Chronic pain - home oxy 15 q6  # Restless leg - home requip   # OSA - cpap qhs  DVT prophylaxis: IV heparin  Code Status: full Family Communication: daughter updated @ bedside 5/15  Level of care: Telemetry Medical Status is: Inpatient Remains inpatient appropriate because: severity of illness    Consultants:  cardiology  Procedures: none  Antimicrobials:  ceftriaxone     Subjective: Ongoing severe substernal chest pain, dyspnea  Objective: Vitals:   07/21/23 0450 07/21/23 0538 07/21/23 0700 07/21/23 0739  BP: 134/73  94/80 108/76  Pulse: 95  83   Resp: 17  19 (!) 21  Temp:  98 F (36.7 C)    TempSrc:  Oral    SpO2: 97%  96% 96%  Weight:      Height:  No intake or output data in the 24 hours ending 07/21/23 0859 Filed Weights   07/20/23 2110  Weight: 125.2 kg    Examination:  General exam: Appears in distress, chronically ill Respiratory system: tachypnea, pursed lip breathing, exp wheeze Cardiovascular system: tachycardia Gastrointestinal system: Abdomen is obese, soft and nontender.   Central nervous system: Alert and oriented. No focal neurological deficits. Extremities: Symmetric 5 x 5 power. Trace LE edema Skin: dirty skin, no  visible lesions Psychiatry: upset    Data Reviewed: I have personally reviewed following labs and imaging studies  CBC: Recent Labs  Lab 07/14/23 1020 07/20/23 2113 07/21/23 0450  WBC 7.1 4.7 3.2*  HGB 11.9* 12.4 12.2  HCT 35.8* 36.5 38.2  MCV 96.2 94.3 99.5  PLT 138* 97* 86*   Basic Metabolic Panel: Recent Labs  Lab 07/14/23 1020 07/20/23 2113 07/21/23 0450  NA 137 135 136  K 3.9 3.9 4.1  CL 104 103 106  CO2 24 18* 20*  GLUCOSE 151* 161* 153*  BUN 19 27* 21*  CREATININE 0.74 1.18* 0.87  CALCIUM 9.2 8.3* 8.0*   GFR: Estimated Creatinine Clearance: 91.5 mL/min (by C-G formula based on SCr of 0.87 mg/dL). Liver Function Tests: Recent Labs  Lab 07/20/23 2113  AST 30  ALT 39  ALKPHOS 67  BILITOT 0.8  PROT 6.6  ALBUMIN 3.0*   No results for input(s): "LIPASE", "AMYLASE" in the last 168 hours. No results for input(s): "AMMONIA" in the last 168 hours. Coagulation Profile: No results for input(s): "INR", "PROTIME" in the last 168 hours. Cardiac Enzymes: No results for input(s): "CKTOTAL", "CKMB", "CKMBINDEX", "TROPONINI" in the last 168 hours. BNP (last 3 results) No results for input(s): "PROBNP" in the last 8760 hours. HbA1C: No results for input(s): "HGBA1C" in the last 72 hours. CBG: No results for input(s): "GLUCAP" in the last 168 hours. Lipid Profile: No results for input(s): "CHOL", "HDL", "LDLCALC", "TRIG", "CHOLHDL", "LDLDIRECT" in the last 72 hours. Thyroid Function Tests: No results for input(s): "TSH", "T4TOTAL", "FREET4", "T3FREE", "THYROIDAB" in the last 72 hours. Anemia Panel: No results for input(s): "VITAMINB12", "FOLATE", "FERRITIN", "TIBC", "IRON", "RETICCTPCT" in the last 72 hours. Urine analysis:    Component Value Date/Time   COLORURINE Yellow 01/25/2012 1944   APPEARANCEUR Cloudy 01/25/2012 1944   LABSPEC 1.024 01/25/2012 1944   PHURINE 5.0 01/25/2012 1944   GLUCOSEU Negative 01/25/2012 1944   HGBUR Negative 01/25/2012 1944    BILIRUBINUR Negative 01/25/2012 1944   KETONESUR Negative 01/25/2012 1944   PROTEINUR Negative 01/25/2012 1944   NITRITE Positive 01/25/2012 1944   LEUKOCYTESUR Negative 01/25/2012 1944   Sepsis Labs: @LABRCNTIP (procalcitonin:4,lacticidven:4)  ) Recent Results (from the past 240 hours)  Blood culture (routine x 2)     Status: None (Preliminary result)   Collection Time: 07/20/23  9:13 PM   Specimen: BLOOD  Result Value Ref Range Status   Specimen Description BLOOD BLOOD LEFT ARM  Final   Special Requests   Final    BOTTLES DRAWN AEROBIC AND ANAEROBIC Blood Culture results may not be optimal due to an inadequate volume of blood received in culture bottles   Culture   Final    NO GROWTH < 12 HOURS Performed at [facility], [address] [place], [place] [address]    Report Status PENDING  Incomplete  Blood culture (routine x 2)     Status: None (Preliminary result)   Collection Time: 07/20/23  9:23 PM   Specimen: BLOOD  Result Value Ref Range  Status   Specimen Description BLOOD BLOOD RIGHT ARM  Final   Special Requests   Final    BOTTLES DRAWN AEROBIC AND ANAEROBIC Blood Culture results may not be optimal due to an inadequate volume of blood received in culture bottles   Culture   Final    NO GROWTH < 12 HOURS Performed at [facility], [address], [place], [place] [address]    Report Status PENDING  Incomplete         Radiology Studies: DG Chest Port 1 View Result Date: 07/20/2023 CLINICAL DATA:  Shortness of breath. EXAM: PORTABLE CHEST 1 VIEW COMPARISON:  Jul 14, 2023 FINDINGS: There stable right-sided venous Port-A-Cath positioning. The cardiac silhouette is mildly enlarged and unchanged in size. Mild atelectatic changes are noted within the bilateral lung bases. No pleural effusion or pneumothorax is identified. The visualized skeletal structures are unremarkable. IMPRESSION: Stable cardiomegaly with mild bibasilar atelectasis. Electronically  Signed   By: Virgle Grime M.D.   On: 07/20/2023 21:43        Scheduled Meds:  diphenhydrAMINE   50 mg Oral Once   Or   diphenhydrAMINE   50 mg Intravenous Once   enoxaparin  (LOVENOX ) injection  65 mg Subcutaneous Q24H   guaiFENesin   600 mg Oral BID   ipratropium-albuterol   3 mL Nebulization QID   methylPREDNISolone  (SOLU-MEDROL ) injection  40 mg Intravenous Once   rOPINIRole   1 mg Oral QHS   Continuous Infusions:  sodium chloride  100 mL/hr at 07/20/23 2356   amiodarone 60 mg/hr (07/21/23 0501)   amiodarone     cefTRIAXone  (ROCEPHIN )  IV Stopped (07/21/23 0116)     LOS: 1 day   CRITICAL CARE Performed by: Raymonde Calico   Total critical care time: 63 minutes  Critical care time was exclusive of separately billable procedures and treating other patients.  Critical care was necessary to treat or prevent imminent or life-threatening deterioration.  Critical care was time spent personally by me on the following activities: development of treatment plan with patient and/or surrogate as well as nursing, discussions with consultants, evaluation of patient's response to treatment, examination of patient, obtaining history from patient or surrogate, ordering and performing treatments and interventions, ordering and review of laboratory studies, ordering and review of radiographic studies, pulse oximetry and re-evaluation of patient's condition.    Raymonde Calico, MD [facility]   If 7PM-7AM, please contact night-coverage [email or web address] Password [email or web address] 07/21/2023, 8:59 AM

## 2023-07-21 NOTE — H&P (Addendum)
 [place]   PATIENT NAME: Brandi Fowler    MR#:  [id number]  DATE OF BIRTH:  09/30/1965  DATE OF ADMISSION:  07/20/2023  PRIMARY CARE PHYSICIAN: [facility]   Patient is coming from: Home  REQUESTING/REFERRING PHYSICIAN: Bryson Carbine, MD GI  CHIEF COMPLAINT:   Chief Complaint  Patient presents with   Shortness of Breath    HISTORY OF PRESENT ILLNESS:  Brandi Fowler is a 58 y.o. Caucasian female with medical history significant for COPD, lung cancer, schizophrenia, GERD, urolithiasis, OSA, seizures, PVD, PTSD, depression, and DDD, who presented to the emergency room with  acute onset of worsening dyspnea with associated cough productive of clear sputum and occipital wheezing over the last week.  She has been experiencing chest pain with cough with occasional palpitations.  On arrival to the hospital he was in atrial fibrillation with RVR 200.  He was cardioverted by EMS.  No fever or chills.  No nausea or vomiting or abdominal pain.  No dysuria, oliguria or hematuria or flank pain.  Patient was reportedly in atrial fibrillation with rapid ventricular sponsor more than 200.  She was cardioverted in the field by EMS and return to sinus rhythm.  She was given 2 g of IV magnesium sulfate, 2 DuoNebs and 1 albuterol  treatment.  ED Course: When the patient came to the emergency room, heart rate is 116.  Oxygen  to 98% on 3 L O2 by nasal cannula with otherwise normal vital signs and later on heart rate was 108 with respiratory rate at 31..  Labs revealed ABG with pH 7.419 with PCO2 36 and pO2 98 O2 sat of 98.5%, and 3 L of O2 by nasal cannula.  CMP was remarkable for CO2 of 18, BUN of 27 and creatinine of 1.1 with calcium of 8.3 and albumin of 3.  BNP was 382.4 with OF 1.6 and later 1.3 CBC was remarkable for thrombocytopenia and blood cultures were drawn.   EKG as reviewed by me : EKG showed sinus tachycardia rate of 114 with premature ventricular and  supraventricular complexes.  Later on EKG showed sinus tachycardia with rate of 101. Imaging: Portable chest x-ray showed cardiomegaly with mild bibasilar atelectasis.  The patient was given 2 DuoNebs and 2 boluses of IV normal saline 500 mL.  She will be admitted to a medical telemetry bed for further valuation and management. PAST MEDICAL HISTORY:   Past Medical History:  Diagnosis Date   BRBPR (bright red blood per rectum)    CAP (community acquired pneumonia)    Chronic diarrhea    Chronic kidney disease    Chronic myofascial pain    Chronic pain    Chronic venous insufficiency    Cigarette smoker    COPD (chronic obstructive pulmonary disease) (HCC)    Coronary artery disease    DDD (degenerative disc disease), cervical    DDD (degenerative disc disease), thoracic    Depression    Elevated blood pressure reading without diagnosis of hypertension    Elevated hemoglobin A1c    Female hirsutism    GERD (gastroesophageal reflux disease)    Headache    migraines   History of adenomatous polyp of colon    History of kidney stones    History of sepsis    Kidney stones    Lung cancer (HCC)    Lymphedema    Mediastinal adenopathy    Morbid obesity (HCC)    Obesity, Class III, BMI 40-49.9 (morbid  obesity)    OSA (obstructive sleep apnea)    Paranoid schizophrenia (HCC)    Peripheral vascular disease (HCC)    Primary cancer of left upper lobe of lung (HCC)    PTSD (post-traumatic stress disorder)    RLS (restless legs syndrome)    Seizures (HCC)    last seizure in [place] 2025   Sleep apnea    Supplemental oxygen  dependent    2-3 L [place]   Syncope     PAST SURGICAL HISTORY:   Past Surgical History:  Procedure Laterality Date   CERVICAL SPINE SURGERY     Fusion C6-7   CHOLECYSTECTOMY     COLONOSCOPY WITH PROPOFOL  N/A 11/18/2014   Procedure: COLONOSCOPY WITH PROPOFOL ;  Surgeon: Cassie Click, MD;  Location: [facility];  Service: Endoscopy;  Laterality: N/A;    ESOPHAGOGASTRODUODENOSCOPY     FLEXIBLE BRONCHOSCOPY N/A 05/13/2023   Procedure: BRONCHOSCOPY, FLEXIBLE;  Surgeon: Erskin Hearing, MD;  Location: [facility] ORS;  Service: Thoracic;  Laterality: N/A;   IR IMAGING GUIDED PORT INSERTION  06/03/2023   KNEE SURGERY Left    polpectomy N/A    TUBAL LIGATION     VIDEO BRONCHOSCOPY WITH ENDOBRONCHIAL ULTRASOUND N/A 05/13/2023   Procedure: BRONCHOSCOPY, WITH EBUS;  Surgeon: Erskin Hearing, MD;  Location: [facility] ORS;  Service: Thoracic;  Laterality: N/A;    SOCIAL HISTORY:   Social History   Tobacco Use   Smoking status: Every Day    Current packs/day: 0.25    Average packs/day: 0.3 packs/day for 45.0 years (11.3 ttl pk-yrs)    Types: Cigarettes   Smokeless tobacco: Former  Substance Use Topics   Alcohol use: Not Currently    Alcohol/week: 1.0 standard drink of alcohol    Types: 1 Standard drinks or equivalent per week    FAMILY HISTORY:   Family History  Problem Relation Age of Onset   Alcohol abuse Mother    Cancer Mother    COPD Mother    Hearing loss Mother    Vision loss Mother    Alcohol abuse Father    Depression Father    Early death Father    Mental illness Father     DRUG ALLERGIES:   Allergies  Allergen Reactions   Aripiprazole Swelling    Other Reaction(s): Other (See Comments)  Other reaction(s): Edema   Baclofen Swelling   Buprenorphine-Naloxone  Other (See Comments)    Other reaction(s): Other (See Comments), Other (See Comments), seizure  SEIZURE   Clonazepam Rash   Cyclobenzaprine Nausea And Vomiting and Swelling    Other reaction(s): swelling, vomiting   Doxepin Rash   Fentanyl  Nausea And Vomiting    Other reaction(s): Other (See Comments)   Lurasidone Swelling and Nausea Only    Other reaction(s): unknown   Methadone Swelling    Other reaction(s): Angioedema, n/v   Oxymorphone Other (See Comments)    Other reaction(s): Other (See Comments), Other (See Comments), Other (See Comments), seizure  SEIZURE   seizures   Penicillins Swelling    Other reaction(s): Throat swells  Other reaction(s): Edema, Other (See Comments), UNKNOWN   Pregabalin  Nausea Only and Swelling   Quetiapine Other (See Comments), Diarrhea and Nausea Only    Other reaction(s): Other (See Comments), swelling   Tolmetin Rash   Trazodone  Itching and Hives   Acetaminophen  Nausea And Vomiting, Diarrhea and Nausea Only    Other reaction(s): diarrhea, nausea, vomiting, Other (See Comments)   Bupropion Other (See Comments)    Other reaction(s): Abdominal Pain  Colestipol     Other reaction(s): blood in urine, tingling in hands, feet, legs, Other (See Comments)  Urine in blood tingling in hands feet legs   Gabapentin Nausea Only   Hydrocodone Itching and Nausea Only    Other reaction(s): rash, swelling, vomiting   Paroxetine Nausea Only    Other reaction(s): unknown   Sertraline Diarrhea and Nausea And Vomiting   Amitriptyline Hcl    Carbamazepine Hives   Ciprofloxacin Hcl Itching   Codeine    Divalproex  Sodium Diarrhea   Etodolac Swelling   Haloperidol      Other reaction(s): Headache   Hyaluronate Sodium    Hydrochlorothiazide     unknown   Ibuprofen Other (See Comments)    Bleeding in stomach   Indocin [Indomethacin]    Ketoprofen    Latuda [Lurasidone Hcl]    Lidocaine  Hives   Meloxicam    Metformin And Related     unknown   Methocarbamol      unknown   Nabumetone    Naproxen Hives and Nausea Only   Ondansetron     Opana [Oxymorphone Hcl] Other (See Comments)    seizures   Oxaprozin Hives   Perphenazine  Hives   Risperidone  And Related Itching   Seroquel [Quetiapine Fumarate]    Silver    Suboxone [Buprenorphine Hcl-Naloxone  Hcl] Other (See Comments)    seizures   Tramadol    Valproic Acid Diarrhea   Zoloft [Sertraline Hcl] Nausea And Vomiting   Bromfenac Rash   Buprenorphine Rash   Ciprofloxacin Itching and Other (See Comments)    Other reaction(s): Unknown   Clindamycin Rash    Clindamycin/Lincomycin Rash   Flurbiprofen Swelling and Rash   Metronidazole     Other reaction(s): blood in urine, tingling in hands, legs, feet, Other (See Comments)  Blood in urine tingling in hands feet legs   Mirtazapine Itching    Other reaction(s): unknown   Other Itching and Rash    Patient reports itching and rash after oral contrast. No issues with IV contrast in the past. She has not required pre-meds (07/12/23)    REVIEW OF SYSTEMS:   ROS As per history of present illness. All pertinent systems were reviewed above. Constitutional, HEENT, cardiovascular, respiratory, GI, GU, musculoskeletal, neuro, psychiatric, endocrine, integumentary and hematologic systems were reviewed and are otherwise negative/unremarkable except for positive findings mentioned above in the HPI.   MEDICATIONS AT HOME:   Prior to Admission medications   Medication Sig Start Date End Date Taking? Authorizing Provider  albuterol  (ACCUNEB ) 1.25 MG/3ML nebulizer solution Take 1 ampule by nebulization every 6 (six) hours as needed for wheezing.   Yes [provider]  albuterol  (VENTOLIN  HFA) 108 (90 Base) MCG/ACT inhaler Inhale 2 puffs into the lungs every 4 (four) hours as needed.   Yes [provider]  Ensifentrine  (OHTUVAYRE ) 3 MG/2.5ML SUSP Inhale 1 vial into the lungs in the morning and at bedtime.   Yes [provider]  oxyCODONE  (ROXICODONE ) 15 MG immediate release tablet Take 15 mg by mouth in the morning, at noon, in the evening, and at bedtime.   Yes [provider]  sulfamethoxazole-trimethoprim (BACTRIM) 400-80 MG tablet Take 1 tablet by mouth 3 (three) times a week.   Yes [provider]  tiZANidine  (ZANAFLEX ) 4 MG tablet Take 4 mg by mouth every 6 (six) hours as needed for muscle spasms. 06/28/23  Yes [provider]  Benzocaine , Topical, 20 % OINT Apply topically to port site 1-2 hours prior to port  access. Patient not taking: Reported on  07/11/2023 06/01/23   Brahmanday, Govinda R, MD  diphenhydrAMINE  (BENADRYL ) 50 MG tablet Take 1 tablet (50 mg) by mouth 1 hour prior to CT scan due to contrast allergy. May cause drowsiness Patient not taking: Reported on 07/21/2023 07/12/23   Nelda Balsam, NP  EPINEPHrine 0.3 mg/0.3 mL IJ SOAJ injection Inject 0.3 mg into the muscle as needed for anaphylaxis. Patient not taking: Reported on 07/11/2023    [provider]  naloxone  (NARCAN ) nasal spray 4 mg/0.1 mL CALL 911. INSTILL 1 SPRAY IN ONE NOSTRIL. MAY REPEAT Q 2 TO 3 MINUTES IF SYMPTOMS OF AN OPIOID EMERGENCY PERSISTS. ALTERNATE NOSTRILS. Patient not taking: Reported on 07/11/2023 07/13/16   [provider]  OXYGEN  Inhale 2-3 L into the lungs continuous.    [provider]  predniSONE  (DELTASONE ) 5 MG tablet Take 1 tablet by mouth daily with breakfast. Patient not taking: Reported on 07/21/2023 08/31/22   [provider]  predniSONE  (DELTASONE ) 50 MG tablet Take one tablet (50 mg) by mouth 13, 7, and 1 hour prior to imaging. For allergy due to contrast dye Patient not taking: Reported on 07/21/2023 07/12/23   Nelda Balsam, NP  prochlorperazine  (COMPAZINE ) 10 MG tablet Take 1 tablet (10 mg total) by mouth every 6 (six) hours as needed for nausea or vomiting. Patient not taking: Reported on 07/11/2023 05/24/23   Brahmanday, Govinda R, MD  roflumilast (DALIRESP) 500 MCG TABS tablet Take 1 tablet by mouth every morning.    [provider]  rOPINIRole  (REQUIP ) 0.5 MG tablet Take 1 tablet (0.5 mg total) by mouth at bedtime. Patient taking differently: Take 1 mg by mouth at bedtime. 06/22/18 06/17/23  Brown, Fallon E, NP  TRELEGY ELLIPTA 100-62.5-25 MCG/ACT AEPB Inhale 1 puff into the lungs every morning.    [provider]  triamcinolone  cream (KENALOG ) 0.1 % Apply 1 Application topically 2 (two) times daily. Patient not taking: Reported on 06/30/2023 06/21/23   Nelda Balsam, NP      VITAL SIGNS:  Blood  pressure 114/64, pulse (!) 106, temperature 97.8 F (36.6 C), temperature source Oral, resp. rate 20, height 5\' 5"  (1.651 m), weight 125.2 kg, last menstrual period 08/13/2014, SpO2 96%.  PHYSICAL EXAMINATION:  Physical Exam  GENERAL:  58 y.o.-year-old Caucasian female with patient lying in the bed with no acute distress.  EYES: Pupils equal, round, reactive to light and accommodation. No scleral icterus. Extraocular muscles intact.  HEENT: Head atraumatic, normocephalic. Oropharynx and nasopharynx clear.  NECK:  Supple, no jugular venous distention. No thyroid enlargement, no tenderness.  LUNGS: Diffuse expiratory wheezes with tight expiratory airflow and  harsh vesicular breathing.. No use of accessory muscles of respiration.  CARDIOVASCULAR: Regular rate and rhythm, S1, S2 normal. No murmurs, rubs, or gallops.  ABDOMEN: Soft, nondistended, nontender. Bowel sounds present. No organomegaly or mass.  EXTREMITIES: No pedal edema, cyanosis, or clubbing.  NEUROLOGIC: Cranial nerves II through XII are intact. Muscle strength 5/5 in all extremities. Sensation intact. Gait not checked.  PSYCHIATRIC: The patient is alert and oriented x 3.  Normal affect and good eye contact. SKIN: No obvious rash, lesion, or ulcer.   LABORATORY PANEL:   CBC Recent Labs  Lab 07/20/23 2113  WBC 4.7  HGB 12.4  HCT 36.5  PLT 97*   ------------------------------------------------------------------------------------------------------------------  Chemistries  Recent Labs  Lab 07/20/23 2113  NA 135  K 3.9  CL 103  CO2 18*  GLUCOSE 161*  BUN  27*  CREATININE 1.18*  CALCIUM 8.3*  AST 30  ALT 39  ALKPHOS 67  BILITOT 0.8   ------------------------------------------------------------------------------------------------------------------  Cardiac Enzymes No results for input(s): "TROPONINI" in the last 168  hours. ------------------------------------------------------------------------------------------------------------------  RADIOLOGY:  DG Chest Port 1 View Result Date: 07/20/2023 CLINICAL DATA:  Shortness of breath. EXAM: PORTABLE CHEST 1 VIEW COMPARISON:  Jul 14, 2023 FINDINGS: There stable right-sided venous Port-A-Cath positioning. The cardiac silhouette is mildly enlarged and unchanged in size. Mild atelectatic changes are noted within the bilateral lung bases. No pleural effusion or pneumothorax is identified. The visualized skeletal structures are unremarkable. IMPRESSION: Stable cardiomegaly with mild bibasilar atelectasis. Electronically Signed   By: Virgle Grime M.D.   On: 07/20/2023 21:43      IMPRESSION AND PLAN:  Assessment and Plan: * COPD exacerbation (HCC) - The patient will be admitted to a medically monitored bed. - We will place the patient IV steroid therapy with IV Solu-Medrol  as well as nebulized bronchodilator therapy with duonebs q.i.d. and q.4 hours p.r.n.Aaron Aas - Mucolytic therapy will be provided with Mucinex  and antibiotic therapy with IV Rocephin . - O2 protocol will be followed. - We will hold off long-acting beta agonist.   Paroxysmal atrial fibrillation with RVR (HCC) - The patient Was successfully cardioverted to sinus rhythm. - We will optimize electrolytes. - 2D echo will be obtained. - Cardiology consult will be obtained. - I notified [facility] group with the patient.  Restless leg syndrome - Will continue Requip .  Pulmonary hypertension (HCC) - Will continue Daliresp.   DVT prophylaxis: Lovenox .  Advanced Care Planning:  Code Status: full code.  Family Communication:  The plan of care was discussed in details with the patient (and family). I answered all questions. The patient agreed to proceed with the above mentioned plan. Further management will depend upon hospital course. Disposition Plan: Back to previous home environment Consults called:  Cardiology All the records are reviewed and case discussed with ED provider.  Status is: Inpatient  At the time of the admission, it appears that the appropriate admission status for this patient is inpatient.  This is judged to be reasonable and necessary in order to provide the required intensity of service to ensure the patient's safety given the presenting symptoms, physical exam findings and initial radiographic and laboratory data in the context of comorbid conditions.  The patient requires inpatient status due to high intensity of service, high risk of further deterioration and high frequency of surveillance required.  I certify that at the time of admission, it is my clinical judgment that the patient will require inpatient hospital care extending more than 2 midnights.                            Dispo: The patient is from: Home              Anticipated d/c is to: Home              Patient currently is not medically stable to d/c.              Difficult to place patient: No  Virgene Griffin M.D on 07/21/2023 at 3:10 AM  [facility]   From 7 PM-7 AM, contact night-coverage [email or web address]  CC: Primary care physician; [facility] [employer]

## 2023-07-21 NOTE — ED Notes (Signed)
 Patient transported to CT

## 2023-07-21 NOTE — TOC Benefit Eligibility Note (Signed)
 Pharmacy Patient Advocate Encounter  Insurance verification completed.    The patient is insured through [employer] . Patient has Medicare and is not eligible for a copay card, but may be able to apply for patient assistance or Medicare RX Payment Plan (Patient Must reach out to their plan, if eligible for payment plan), if available.    Ran test claim for Eliquis  5mg  and the current 30 day co-pay is $0.   This test claim was processed through [facility]- copay amounts may vary at other pharmacies due to [employer], or as the patient moves through the different stages of their insurance plan.

## 2023-07-22 ENCOUNTER — Ambulatory Visit

## 2023-07-22 DIAGNOSIS — J441 Chronic obstructive pulmonary disease with (acute) exacerbation: Secondary | ICD-10-CM | POA: Diagnosis not present

## 2023-07-22 LAB — BASIC METABOLIC PANEL WITH GFR
Anion gap: 10 (ref 5–15)
BUN: 17 mg/dL (ref 6–20)
CO2: 22 mmol/L (ref 22–32)
Calcium: 8.3 mg/dL — ABNORMAL LOW (ref 8.9–10.3)
Chloride: 107 mmol/L (ref 98–111)
Creatinine, Ser: 0.68 mg/dL (ref 0.44–1.00)
GFR, Estimated: >60 mL/min (ref >60)
Glucose, Bld: 104 mg/dL — ABNORMAL HIGH (ref 70–99)
Potassium: 3.8 mmol/L (ref 3.5–5.1)
Sodium: 139 mmol/L (ref 135–145)

## 2023-07-22 LAB — CBC
HCT: 32.9 % — ABNORMAL LOW (ref 36.0–46.0)
Hemoglobin: 11.3 g/dL — ABNORMAL LOW (ref 12.0–15.0)
MCH: 32.4 pg (ref 26.0–34.0)
MCHC: 34.3 g/dL (ref 30.0–36.0)
MCV: 94.3 fL (ref 80.0–100.0)
Platelets: 87 10*3/uL — ABNORMAL LOW (ref 150–400)
RBC: 3.49 MIL/uL — ABNORMAL LOW (ref 3.87–5.11)
RDW: 15.7 % — ABNORMAL HIGH (ref 11.5–15.5)
WBC: 4.6 10*3/uL (ref 4.0–10.5)
nRBC: 0 % (ref 0.0–0.2)

## 2023-07-22 MED ORDER — FUROSEMIDE 10 MG/ML IJ SOLN
20.0000 mg | Freq: Once | INTRAMUSCULAR | Status: AC
  Administered 2023-07-22: 20 mg via INTRAVENOUS
  Filled 2023-07-22: qty 2

## 2023-07-22 MED ORDER — METHYLPREDNISOLONE SODIUM SUCC 40 MG IJ SOLR
40.0000 mg | Freq: Two times a day (BID) | INTRAMUSCULAR | Status: DC
  Administered 2023-07-22 – 2023-07-27 (×11): 40 mg via INTRAVENOUS
  Filled 2023-07-22 (×11): qty 1

## 2023-07-22 NOTE — Progress Notes (Signed)
 PROGRESS NOTE    Brandi Fowler  WUJ:[id number] DOB: 03/26/1965 DOA: 07/20/2023 PCP: [facility]  Chief Complaint  Patient presents with   Shortness of Breath    Hospital Course:  Brandi Fowler is a 58 year old female with COPD, lung cancer, schizophrenia, GERD, urolithiasis, OSA, seizure disorder, PVD, PTSD, depression, degenerative disc disease, presents to the ED with acute onset dyspnea and cough.  Arrival to the ED she was found to be in A-fib RVR 200s, she was cardioverted by EMS and returned to sinus rhythm.  In the ED she received 2 g mag sulfate, DuoNebs, albuterol .  Subjective: Evaluation this morning patient is dyspneic with pursed lip breathing.  Maintaining O2 sats on 3 L nasal cannula. Patient reports that she is persistently short of breath. When discussing her mottled extremities that she reports that her legs look like this constantly.  She does not believe that they are any worse than normal.  Upon reevaluation later in the afternoon patient is resting more comfortably in bed.  She is still having pursed lip breathing.  We discussed trial of BiPAP.  She would feel more comfortable using her home CPAP.  She tried to use her device earlier in the day could not tolerated due to noise and air leak  Objective: Vitals:   07/21/23 2216 07/21/23 2219 07/22/23 0112 07/22/23 0507  BP:   130/84 (!) 115/48  Pulse:   (!) 105 (!) 117  Resp: (!) 31 19 18 18   Temp:   98.3 F (36.8 C) 98.7 F (37.1 C)  TempSrc:      SpO2:   96% 91%  Weight:      Height:        Intake/Output Summary (Last 24 hours) at 07/22/2023 0859 Last data filed at 07/22/2023 0700 Gross per 24 hour  Intake 1169.35 ml  Output --  Net 1169.35 ml   Filed Weights   07/20/23 2110  Weight: 125.2 kg    Examination: General exam: Tripoding, pursed lip breathing, tachypneic. Respiratory system: Poor aeration bilaterally, tachypneic. Cardiovascular system: Rapid rate, irregular  rhythm. Gastrointestinal system: Abdomen is nondistended, soft and nontender.  Neuro: Alert and oriented. No focal neurological deficits. Extremities: All extremities appear mottled.  Some dusky discoloration in legs.  Patient reports that this is constant and not a new finding Psychiatry: Demonstrates appropriate judgement and insight. Mood & affect appropriate for situation.   Assessment & Plan:  Principal Problem:   COPD exacerbation (HCC) Active Problems:   Paroxysmal atrial fibrillation with RVR (HCC)   Seizure disorder (HCC)   Schizophrenia (HCC)   Morbid obesity (HCC)   Obstructive sleep apnea syndrome   Chronic pain   Lung cancer (HCC)   Pulmonary hypertension (HCC)   Restless leg syndrome   COVID-19   A-fib RVR - Initial rate 200s - Cardioverted with EMS, reportedly converted to sinus rhythm.  Then back to RVR - RVR likely triggered by respiratory distress and COVID-19 - Cardiology has been consulted - Currently on amio drip - Has been loaded with digoxin, cardiology considering wean diltiazem as blood pressure allows - Has been initiated on Eliquis - TTE: LVEF 60 to 65%, no RWMA, diastolic parameters indeterminate.  No noted valvular disease - TSH: WNL  Substernal chest pain - Pleuritic in nature, severe, present for a week - CTA PE protocol 5/6: No PE - Pulmonology believes this may be secondary to mass effect from known malignancy - CT dissection protocol: Negative  COPD with exacerbation COVID-19  Acute on chronic hypoxic respiratory failure - Chronically requires 2 L at home, currently maintaining O2 sats but demonstrating increased work of breathing. - Will trial CPAP (patient prefers over BiPAP despite education) to allow rest. - Patient is high risk of decompensation, will continue to monitor respiratory status closely.  If she continues to struggle despite CPAP therapy will involve pulmonology and consider for intubation - No focal findings on CXR -  Continue steroids - Proceed with ipratropium only given RVR - Xopenex  as needed - Resume home Daliresp, Trelegy on hold  Stage III SCC lung cancer - Status post radiation - Currently on chemo, holding while admitted - CT this admission shows enlarged precarinal lymph node 2.0 x 2.5 cm, slightly decreased in the prior studies.  Compatible with metastasis.  Multiple additional mildly prominent mediastinal and bilateral hilar lymph nodes.  No new suspicious lung nodules.  Stable 4 x 6 mm noncalcified nodule in right upper lobe. - Oncology outpatient follow-up  Morbid obesity BMI 45 - Outpatient follow up for lifestyle modification and risk factor management  Pancytopenia Leukopenia Thrombocytopenia - Likely secondary to chemo, will complicate infectious picture is difficult to trend - Continue to follow CBC  Chronic pain - Resume home dose Oxy  Restless leg syndrome - Resume home meds  OSA - CPAP at night  PTSD Depression - Resume all home meds  Tobacco abuse - Has been counseled extensively on cessation, especially in setting of active lung cancer and COPD  DVT prophylaxis: Eliquis   Code Status: Full Code Disposition: Inpatient, respiratory status is tenuous.  Currently in acute hypoxic respiratory failure.  Consultants:  Treatment Team:  Consulting Physician: Sheryle Donning, MD  Procedures:    Antimicrobials:  Anti-infectives (From admission, onward)    Start     Dose/Rate Route Frequency Ordered Stop   07/20/23 2345  cefTRIAXone  (ROCEPHIN ) 1 g in sodium chloride  0.9 % 100 mL IVPB  Status:  Discontinued        1 g 200 mL/hr over 30 Minutes Intravenous Daily at bedtime 07/20/23 2329 07/21/23 1154       Data Reviewed: I have personally reviewed following labs and imaging studies CBC: Recent Labs  Lab 07/20/23 2113 07/21/23 0450 07/22/23 0550  WBC 4.7 3.2* 4.6  HGB 12.4 12.2 11.3*  HCT 36.5 38.2 32.9*  MCV 94.3 99.5 94.3  PLT 97* 86* 87*    Basic Metabolic Panel: Recent Labs  Lab 07/20/23 2113 07/21/23 0450 07/22/23 0550  NA 135 136 139  K 3.9 4.1 3.8  CL 103 106 107  CO2 18* 20* 22  GLUCOSE 161* 153* 104*  BUN 27* 21* 17  CREATININE 1.18* 0.87 0.68  CALCIUM 8.3* 8.0* 8.3*   GFR: Estimated Creatinine Clearance: 99.5 mL/min (by C-G formula based on SCr of 0.68 mg/dL). Liver Function Tests: Recent Labs  Lab 07/20/23 2113  AST 30  ALT 39  ALKPHOS 67  BILITOT 0.8  PROT 6.6  ALBUMIN 3.0*   CBG: No results for input(s): "GLUCAP" in the last 168 hours.  Recent Results (from the past 240 hours)  Blood culture (routine x 2)     Status: None (Preliminary result)   Collection Time: 07/20/23  9:13 PM   Specimen: BLOOD  Result Value Ref Range Status   Specimen Description BLOOD BLOOD LEFT ARM  Final   Special Requests   Final    BOTTLES DRAWN AEROBIC AND ANAEROBIC Blood Culture results may not be optimal due to an inadequate volume of blood  received in culture bottles   Culture   Final    NO GROWTH 2 DAYS Performed at [facility], [address] [place], [place] [address]    Report Status PENDING  Incomplete  Blood culture (routine x 2)     Status: None (Preliminary result)   Collection Time: 07/20/23  9:23 PM   Specimen: BLOOD  Result Value Ref Range Status   Specimen Description BLOOD BLOOD RIGHT ARM  Final   Special Requests   Final    BOTTLES DRAWN AEROBIC AND ANAEROBIC Blood Culture results may not be optimal due to an inadequate volume of blood received in culture bottles   Culture   Final    NO GROWTH 2 DAYS Performed at [facility], [address] [place], [place] [address]    Report Status PENDING  Incomplete  SARS Coronavirus 2 by RT PCR (hospital order, performed in [facility] hospital lab) *cepheid single result test* Anterior Nasal Swab     Status: Abnormal   Collection Time: 07/21/23  8:45 AM   Specimen: Anterior Nasal Swab  Result Value Ref Range Status   SARS  Coronavirus 2 by RT PCR POSITIVE (A) NEGATIVE Final    Comment: (NOTE) SARS-CoV-2 target nucleic acids are DETECTED  SARS-CoV-2 RNA is generally detectable in upper respiratory specimens  during the acute phase of infection.  Positive results are indicative  of the presence of the identified virus, but do not rule out bacterial infection or co-infection with other pathogens not detected by the test.  Clinical correlation with patient history and  other diagnostic information is necessary to determine patient infection status.  The expected result is negative.  Fact Sheet for Patients:   RoadLapTop.co.za   Fact Sheet for Healthcare Providers:   http://kim-miller.com/    This test is not yet approved or cleared by the [place]  FDA and  has been authorized for detection and/or diagnosis of SARS-CoV-2 by FDA under an Emergency Use Authorization (EUA).  This EUA will remain in effect (meaning this test can be used) for the duration of  the COVID-19 declaration under Section 564(b)(1)  of the Act, 21 U.S.C. section 360-bbb-3(b)(1), unless the authorization is terminated or revoked sooner.   Performed at [facility], [address] [place], [place] [address]   Respiratory (~20 pathogens) panel by PCR     Status: None   Collection Time: 07/21/23  9:15 AM   Specimen: Nasopharyngeal Swab; Respiratory  Result Value Ref Range Status   Adenovirus NOT DETECTED NOT DETECTED Final   Coronavirus 229E NOT DETECTED NOT DETECTED Final    Comment: (NOTE) The Coronavirus on the Respiratory Panel, DOES NOT test for the novel  Coronavirus (2019 nCoV)    Coronavirus HKU1 NOT DETECTED NOT DETECTED Final   Coronavirus NL63 NOT DETECTED NOT DETECTED Final   Coronavirus OC43 NOT DETECTED NOT DETECTED Final   Metapneumovirus NOT DETECTED NOT DETECTED Final   Rhinovirus / Enterovirus NOT DETECTED NOT DETECTED Final   Influenza A NOT DETECTED  NOT DETECTED Final   Influenza B NOT DETECTED NOT DETECTED Final   Parainfluenza Virus 1 NOT DETECTED NOT DETECTED Final   Parainfluenza Virus 2 NOT DETECTED NOT DETECTED Final   Parainfluenza Virus 3 NOT DETECTED NOT DETECTED Final   Parainfluenza Virus 4 NOT DETECTED NOT DETECTED Final   Respiratory Syncytial Virus NOT DETECTED NOT DETECTED Final   Bordetella pertussis NOT DETECTED NOT DETECTED Final   Bordetella Parapertussis NOT DETECTED NOT DETECTED Final  Chlamydophila pneumoniae NOT DETECTED NOT DETECTED Final   Mycoplasma pneumoniae NOT DETECTED NOT DETECTED Final    Comment: Performed at [facility], [address] [place], [place] [address]     Radiology Studies: CT Angio Chest/Abd/Pel for Dissection W and/or W/WO Result Date: 07/21/2023 CLINICAL DATA:  severe substernal pleuritic chest pain. History of lung cancer. COPD. * Tracking Code: BO * EXAM: CT ANGIOGRAPHY CHEST, ABDOMEN AND PELVIS TECHNIQUE: Non-contrast CT of the chest was initially obtained. Multidetector CT imaging through the chest, abdomen and pelvis was performed using the standard protocol during bolus administration of intravenous contrast. Multiplanar reconstructed images and MIPs were obtained and reviewed to evaluate the vascular anatomy. RADIATION DOSE REDUCTION: This exam was performed according to the departmental dose-optimization program which includes automated exposure control, adjustment of the mA and/or kV according to patient size and/or use of iterative reconstruction technique. CONTRAST:  OMNIPAQUE  IOHEXOL  350 MG/ML SOLN COMPARISON:  CT Angiography chest from 07/12/2023 and nuclear medicine PET scan from 04/22/2023 FINDINGS: CTA CHEST FINDINGS Cardiovascular: No intramural hematoma noted in the thoracic aorta on the unenhanced images. Thoracic aorta is normal in caliber without aneurysm, dissection, vasculitis or significant stenosis. There is satisfactory opacification of pulmonary artery  branches. There is no embolism up to the proximal subsegmental pulmonary artery level. Normal cardiac size. No pericardial effusion. No aortic aneurysm. There are coronary artery calcifications, in keeping with coronary artery disease. There are also mild peripheral atherosclerotic vascular calcifications of thoracic aorta and its major branches. Mediastinum/Nodes: Visualized thyroid gland appears grossly unremarkable. No solid / cystic mediastinal masses. The esophagus is nondistended precluding optimal assessment. There is an enlarged precarinal lymph node measuring 2.0 x 2.5 cm, slightly decreased in size since the prior study, compatible with metastases. There multiple additional mildly prominent mediastinal and bilateral hilar lymph nodes which do not meet the size criteria and appears slightly decreased since the prior study. No axillary lymphadenopathy by size criteria. Lungs/Pleura: The central tracheo-bronchial tree is patent. Mild upper lobe predominant centrilobular emphysematous changes. There are patchy areas of linear, plate-like atelectasis and/or scarring throughout bilateral lungs. No mass or consolidation. No pleural effusion or pneumothorax. There is a stable 4 x 6 mm noncalcified nodule in the right upper lobe (series 8, image 67). No new suspicious lung nodule. Musculoskeletal: A CT Port-a-Cath is seen in the right upper chest wall with the catheter terminating in the cavo-atrial junction region. Visualized soft tissues of the chest wall are otherwise grossly unremarkable. No suspicious osseous lesions. There are mild multilevel degenerative changes in the visualized spine. Partially seen lower cervical spinal fixation hardware. Review of the MIP images confirms the above findings. CTA ABDOMEN AND PELVIS FINDINGS VASCULAR Aorta: Normal caliber aorta without aneurysm, dissection, vasculitis or significant stenosis. Celiac: Patent without evidence of aneurysm, dissection, vasculitis or significant  stenosis. SMA: Patent without evidence of aneurysm, dissection, vasculitis or significant stenosis. 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 All renal arteries are patent without evidence of aneurysm, dissection, vasculitis, fibromuscular dysplasia or significant stenosis. IMA: Patent without evidence of aneurysm, dissection, vasculitis or significant stenosis. Inflow: Patent without evidence of aneurysm, dissection, vasculitis or significant stenosis. Veins: No obvious venous abnormality within the limitations of this arterial phase study. Review of the MIP images confirms the above findings. NON-VASCULAR Hepatobiliary: The liver is normal in size. Non-cirrhotic configuration. No suspicious mass. No intrahepatic or extrahepatic bile duct dilation. Gallbladder is surgically absent. Pancreas: Unremarkable. No pancreatic ductal  dilatation or surrounding inflammatory changes. Spleen: Within normal limits. No focal lesion. Adrenals/Urinary Tract: There is a stable 2.1 x 2.7 cm right adrenal adenoma. Stable left adrenal thickening without discrete nodule. No suspicious renal mass. There is a stable partially exophytic hypoattenuating structure arising from the right kidney upper pole, which is too small to adequately characterize but unchanged since the prior PET-CT scan and was not FDG avid, favoring proteinaceous/hemorrhagic cyst. There is a smaller simple cyst in the left kidney upper pole, anteriorly. No obstructive uropathy on either side. There is small focus of dystrophic calcification along the right kidney lower pole, posterolaterally. Nephroureterolithiasis on either side. Urinary bladder is under distended, precluding optimal assessment. However, no large mass or stones identified. No perivesical fat stranding. Stomach/Bowel: There is a small diverticulum arising from the second part of duodenum. No disproportionate dilation of the small or large bowel loops. No  evidence of abnormal bowel wall thickening or inflammatory changes. The appendix is unremarkable. There are scattered diverticula throughout the colon, without imaging signs of diverticulitis. Vascular/Lymphatic: No ascites or pneumoperitoneum. No abdominal or pelvic lymphadenopathy, by size criteria. No aneurysmal dilation of the major abdominal arteries. There are mild peripheral atherosclerotic vascular calcifications of the aorta and its major branches. Reproductive: The uterus is unremarkable. No large adnexal mass. Other: There is a tiny fat containing umbilical hernia. The soft tissues and abdominal wall are otherwise unremarkable. Musculoskeletal: No suspicious osseous lesions. There are mild multilevel degenerative changes in the visualized spine. Review of the MIP images confirms the above findings. IMPRESSION: 1. No acute aortic syndrome. No acute thoracic aortic intramural hematoma. No thoracoabdominal aortic aneurysm, dissection, penetrating atherosclerotic ulcer or vasculitis. No pulmonary embolism. 2. No acute inflammatory process identified within the chest, abdomen or pelvis. 3. There is a stable 4 x 6 mm noncalcified nodule in the right upper lobe. No new suspicious lung nodule. 4. Slight interval decrease in the precarinal lymph node, currently measuring 2.0 x 2.5 cm, compatible with improving metastasis. 5. Stable right adrenal adenoma. 6. Multiple other nonacute observations, as described above. Aortic Atherosclerosis (ICD10-I70.0). Electronically Signed   By: Beula Brunswick M.D.   On: 07/21/2023 14:24   ECHOCARDIOGRAM COMPLETE Result Date: 07/21/2023    ECHOCARDIOGRAM REPORT   Patient Name:   AKSHARA RAXTER Breeze Date of Exam: 07/21/2023 Medical Rec #:  [id number]        Height:       65.0 in Accession #:    [id number]       Weight:       276.0 lb Date of Birth:  09-05-1965        BSA:          2.268 m Patient Age:    58 years         BP:           108/76 mmHg Patient Gender: F                HR:            83 bpm. Exam Location:  [facility] Procedure: 2D Echo, Cardiac Doppler and Color Doppler (Both Spectral and Color            Flow Doppler were utilized during procedure). Indications:     Atrial Fibrillation I48.91  History:         Patient has no prior history of Echocardiogram examinations.                  COPD.  Lung cancer.  Sonographer:     Broadus Canes Referring Phys:  [id number] Devorah Fonder Diagnosing Phys: Belva Boyden MD  Sonographer Comments: Technically challenging study due to limited acoustic windows, suboptimal parasternal window and suboptimal apical window. IMPRESSIONS  1. Left ventricular ejection fraction, by estimation, is 60 to 65%. The left ventricle has normal function. The left ventricle has no regional wall motion abnormalities. Left ventricular diastolic parameters are indeterminate.  2. Right ventricular systolic function is normal. The right ventricular size is normal. There is normal pulmonary artery systolic pressure. The estimated right ventricular systolic pressure is 20.1 mmHg.  3. The mitral valve is normal in structure. No evidence of mitral valve regurgitation. No evidence of mitral stenosis.  4. The aortic valve is normal in structure. Aortic valve regurgitation is not visualized. No aortic stenosis is present.  5. The inferior vena cava is dilated in size with >50% respiratory variability, suggesting right atrial pressure of 8 mmHg.  6. Rhythm is atrial fibrillation with RVR FINDINGS  Left Ventricle: Left ventricular ejection fraction, by estimation, is 60 to 65%. The left ventricle has normal function. The left ventricle has no regional wall motion abnormalities. Strain was performed and the global longitudinal strain is indeterminate. The left ventricular internal cavity size was normal in size. There is no left ventricular hypertrophy. Left ventricular diastolic parameters are indeterminate. Right Ventricle: The right ventricular size is normal. No increase in right  ventricular wall thickness. Right ventricular systolic function is normal. There is normal pulmonary artery systolic pressure. The tricuspid regurgitant velocity is 1.94 m/s, and  with an assumed right atrial pressure of 5 mmHg, the estimated right ventricular systolic pressure is 20.1 mmHg. Left Atrium: Left atrial size was normal in size. Right Atrium: Right atrial size was normal in size. Pericardium: There is no evidence of pericardial effusion. Mitral Valve: The mitral valve is normal in structure. No evidence of mitral valve regurgitation. No evidence of mitral valve stenosis. Tricuspid Valve: The tricuspid valve is normal in structure. Tricuspid valve regurgitation is mild . No evidence of tricuspid stenosis. Aortic Valve: The aortic valve is normal in structure. Aortic valve regurgitation is not visualized. No aortic stenosis is present. Aortic valve mean gradient measures 4.5 mmHg. Aortic valve peak gradient measures 7.6 mmHg. Aortic valve area, by VTI measures 3.64 cm. Pulmonic Valve: The pulmonic valve was normal in structure. Pulmonic valve regurgitation is not visualized. No evidence of pulmonic stenosis. Aorta: The aortic root is normal in size and structure. Venous: The inferior vena cava is dilated in size with greater than 50% respiratory variability, suggesting right atrial pressure of 8 mmHg. IAS/Shunts: No atrial level shunt detected by color flow Doppler. Additional Comments: 3D was performed not requiring image post processing on an independent workstation and was indeterminate.  LEFT VENTRICLE PLAX 2D LVIDd:         3.50 cm LVIDs:         2.50 cm LV PW:         0.90 cm LV IVS:        1.20 cm LVOT diam:     2.10 cm LV SV:         66 LV SV Index:   29 LVOT Area:     3.46 cm  LEFT ATRIUM           Index LA Vol (A2C): 43.2 ml 19.04 ml/m LA Vol (A4C): 40.4 ml 17.81 ml/m  AORTIC VALVE AV Area (Vmax):    3.15 cm  AV Area (Vmean):   3.09 cm AV Area (VTI):     3.64 cm AV Vmax:           137.50  cm/s AV Vmean:          91.900 cm/s AV VTI:            0.181 m AV Peak Grad:      7.6 mmHg AV Mean Grad:      4.5 mmHg LVOT Vmax:         125.00 cm/s LVOT Vmean:        82.100 cm/s LVOT VTI:          0.190 m LVOT/AV VTI ratio: 1.05 MITRAL VALVE                TRICUSPID VALVE MV Area (PHT): 4.99 cm     TR Peak grad:   15.1 mmHg MV Decel Time: 152 msec     TR Vmax:        194.00 cm/s MV E velocity: 139.00 cm/s                             SHUNTS                             Systemic VTI:  0.19 m                             Systemic Diam: 2.10 cm Belva Boyden MD Electronically signed by Belva Boyden MD Signature Date/Time: 07/21/2023/10:59:25 AM    Final    DG Chest Port 1 View Result Date: 07/20/2023 CLINICAL DATA:  Shortness of breath. EXAM: PORTABLE CHEST 1 VIEW COMPARISON:  Jul 14, 2023 FINDINGS: There stable right-sided venous Port-A-Cath positioning. The cardiac silhouette is mildly enlarged and unchanged in size. Mild atelectatic changes are noted within the bilateral lung bases. No pleural effusion or pneumothorax is identified. The visualized skeletal structures are unremarkable. IMPRESSION: Stable cardiomegaly with mild bibasilar atelectasis. Electronically Signed   By: Virgle Grime M.D.   On: 07/20/2023 21:43    Scheduled Meds:  apixaban  5 mg Oral BID   budesonide-glycopyrrolate -formoterol  2 puff Inhalation BID   Chlorhexidine  Gluconate Cloth  6 each Topical Daily   Ensifentrine   1 vial Inhalation BID   guaiFENesin   600 mg Oral BID   ipratropium  2 puff Inhalation Q6H   levalbuterol   1 puff Inhalation Q6H   roflumilast  500 mcg Oral BH-q7a   rOPINIRole   1 mg Oral QHS   sodium chloride  flush  10-40 mL Intracatheter Q12H   Continuous Infusions:  amiodarone 30 mg/hr (07/22/23 0700)     LOS: 2 days  CRITICAL CARE Performed by: Camille Thau Total critical care time: 37 minutes Critical care time was exclusive of separately billable procedures and treating other  patients. Critical care was necessary to treat or prevent imminent or life-threatening deterioration. Critical care was time spent personally by me on the following activities: development of treatment plan with patient and/or surrogate as well as nursing, discussions with consultants, evaluation of patient's response to treatment, examination of patient, obtaining history from patient or surrogate, ordering and performing treatments and interventions, ordering and review of laboratory studies, ordering and review of radiographic studies, pulse oximetry and re-evaluation of patient's condition. *Patient is high risk of decompensation.  Currently in acute hypoxic  respiratory failure.  Trialing CPAP/BiPAP therapy.  Close monitoring of respiratory status.  High risk of requiring intubation.  Respiratory status further complicated by A-fib RVR.  Being followed closely by cardiology.  Currently on amiodarone drip  Shaneka Efaw, DO [facility]  To contact the attending physician between 7A-7P please use Epic Chat. To contact the covering physician during after hours 7P-7A, please review Amion.  07/22/2023, 8:59 AM   *This document has been created with the assistance of dictation software. Please excuse typographical errors. *

## 2023-07-22 NOTE — Care Management Important Message (Signed)
 Important Message  Patient Details  Name: ARIONAH SLAWINSKI MRN: [id number] Date of Birth: March 22, 1965   Important Message Given:  Yes - Medicare IM     Anise Kerns 07/22/2023, 1:17 PM

## 2023-07-22 NOTE — Progress Notes (Signed)
 Called to room by MD due to patient increased work of breathing, increased RR and HR.  MD has asked that patient wear her home CPAP due to unable to tolerate our machine.

## 2023-07-22 NOTE — Progress Notes (Signed)
 Rounding Note    Patient Name: Brandi Fowler Date of Encounter: 07/22/2023  [facility] Cardiologist: Belva Boyden, MD   Subjective   Patient seen on AM rounds. Denies chest pain but continues to have shortness of breath. Sinus tachycardia noted on telemetry. No accurate I's & O's have been recorded. O2 sats 85% on 3L O2 via [place]  Inpatient Medications    Scheduled Meds:  apixaban  5 mg Oral BID   budesonide-glycopyrrolate -formoterol  2 puff Inhalation BID   Chlorhexidine  Gluconate Cloth  6 each Topical Daily   Ensifentrine   1 vial Inhalation BID   guaiFENesin   600 mg Oral BID   ipratropium  2 puff Inhalation Q6H   levalbuterol   1 puff Inhalation Q6H   roflumilast  500 mcg Oral BH-q7a   rOPINIRole   1 mg Oral QHS   sodium chloride  flush  10-40 mL Intracatheter Q12H   Continuous Infusions:  amiodarone 30 mg/hr (07/22/23 1053)   PRN Meds: acetaminophen  **OR** acetaminophen , magnesium hydroxide, morphine  injection, oxyCODONE , sodium chloride  flush, tiZANidine , traZODone    Vital Signs    Vitals:   07/22/23 0112 07/22/23 0507 07/22/23 0905 07/22/23 1113  BP: 130/84 (!) 115/48 (!) 144/112 115/70  Pulse: (!) 105 (!) 117 (!) 43 (!) 103  Resp: 18 18 16 17   Temp: 98.3 F (36.8 C) 98.7 F (37.1 C) 98.3 F (36.8 C) 98.4 F (36.9 C)  TempSrc:      SpO2: 96% 91% (!) 85% (!) 76%  Weight:      Height:        Intake/Output Summary (Last 24 hours) at 07/22/2023 1159 Last data filed at 07/22/2023 0700 Gross per 24 hour  Intake 325.3 ml  Output --  Net 325.3 ml      07/20/2023    9:10 PM 07/14/2023    9:43 AM 07/14/2023    8:39 AM  Last 3 Weights  Weight (lbs) 276 lb 273 lb 276 lb 6.4 oz  Weight (kg) 125.193 kg 123.832 kg 125.374 kg      Telemetry    Sinus tachycardia rates 100-120 - Personally Reviewed  ECG    No new tracings - Personally Reviewed  Physical Exam   GEN: Ill-appearing , in mild distress Neck: Unable to determine JVD due to body  habitus Cardiac: RRR, tachycardiac, no murmurs, rubs, or gallops.  Respiratory: Coarse with expiratory wheezing to auscultation bilaterally.  Continues to be tachypneic with talking. Currently on 3 L of O2 via nasal cannula GI: Soft, nontender, non-distended  MS: Trace edema; No deformity. Neuro:  Nonfocal  Psych: Normal affect   Labs    High Sensitivity Troponin:   Recent Labs  Lab 07/14/23 1020 07/20/23 2113 07/21/23 0450 07/21/23 0632  TROPONINIHS 5 15 11 11      Chemistry Recent Labs  Lab 07/20/23 2113 07/21/23 0450 07/22/23 0550  NA 135 136 139  K 3.9 4.1 3.8  CL 103 106 107  CO2 18* 20* 22  GLUCOSE 161* 153* 104*  BUN 27* 21* 17  CREATININE 1.18* 0.87 0.68  CALCIUM 8.3* 8.0* 8.3*  PROT 6.6  --   --   ALBUMIN 3.0*  --   --   AST 30  --   --   ALT 39  --   --   ALKPHOS 67  --   --   BILITOT 0.8  --   --   GFRNONAA 53* >60 >60  ANIONGAP 14 10 10     Lipids No results for  input(s): "CHOL", "TRIG", "HDL", "LABVLDL", "LDLCALC", "CHOLHDL" in the last 168 hours.  Hematology Recent Labs  Lab 07/20/23 2113 07/21/23 0450 07/22/23 0550  WBC 4.7 3.2* 4.6  RBC 3.87 3.84* 3.49*  HGB 12.4 12.2 11.3*  HCT 36.5 38.2 32.9*  MCV 94.3 99.5 94.3  MCH 32.0 31.8 32.4  MCHC 34.0 31.9 34.3  RDW 15.5 15.6* 15.7*  PLT 97* 86* 87*   Thyroid  Recent Labs  Lab 07/21/23 0930  TSH 0.430    BNP Recent Labs  Lab 07/20/23 2113  BNP 382.4*    DDimer No results for input(s): "DDIMER" in the last 168 hours.   Radiology    CT Angio Chest/Abd/Pel for Dissection W and/or W/WO Result Date: 07/21/2023 CLINICAL DATA:  severe substernal pleuritic chest pain. History of lung cancer. COPD. * Tracking Code: BO * EXAM: CT ANGIOGRAPHY CHEST, ABDOMEN AND PELVIS TECHNIQUE: Non-contrast CT of the chest was initially obtained. Multidetector CT imaging through the chest, abdomen and pelvis was performed using the standard protocol during bolus administration of intravenous contrast.  Multiplanar reconstructed images and MIPs were obtained and reviewed to evaluate the vascular anatomy. RADIATION DOSE REDUCTION: This exam was performed according to the departmental dose-optimization program which includes automated exposure control, adjustment of the mA and/or kV according to patient size and/or use of iterative reconstruction technique. CONTRAST:  OMNIPAQUE  IOHEXOL  350 MG/ML SOLN COMPARISON:  CT Angiography chest from 07/12/2023 and nuclear medicine PET scan from 04/22/2023 FINDINGS: CTA CHEST FINDINGS Cardiovascular: No intramural hematoma noted in the thoracic aorta on the unenhanced images. Thoracic aorta is normal in caliber without aneurysm, dissection, vasculitis or significant stenosis. There is satisfactory opacification of pulmonary artery branches. There is no embolism up to the proximal subsegmental pulmonary artery level. Normal cardiac size. No pericardial effusion. No aortic aneurysm. There are coronary artery calcifications, in keeping with coronary artery disease. There are also mild peripheral atherosclerotic vascular calcifications of thoracic aorta and its major branches. Mediastinum/Nodes: Visualized thyroid gland appears grossly unremarkable. No solid / cystic mediastinal masses. The esophagus is nondistended precluding optimal assessment. There is an enlarged precarinal lymph node measuring 2.0 x 2.5 cm, slightly decreased in size since the prior study, compatible with metastases. There multiple additional mildly prominent mediastinal and bilateral hilar lymph nodes which do not meet the size criteria and appears slightly decreased since the prior study. No axillary lymphadenopathy by size criteria. Lungs/Pleura: The central tracheo-bronchial tree is patent. Mild upper lobe predominant centrilobular emphysematous changes. There are patchy areas of linear, plate-like atelectasis and/or scarring throughout bilateral lungs. No mass or consolidation. No pleural effusion or  pneumothorax. There is a stable 4 x 6 mm noncalcified nodule in the right upper lobe (series 8, image 67). No new suspicious lung nodule. Musculoskeletal: A CT Port-a-Cath is seen in the right upper chest wall with the catheter terminating in the cavo-atrial junction region. Visualized soft tissues of the chest wall are otherwise grossly unremarkable. No suspicious osseous lesions. There are mild multilevel degenerative changes in the visualized spine. Partially seen lower cervical spinal fixation hardware. Review of the MIP images confirms the above findings. CTA ABDOMEN AND PELVIS FINDINGS VASCULAR Aorta: Normal caliber aorta without aneurysm, dissection, vasculitis or significant stenosis. Celiac: Patent without evidence of aneurysm, dissection, vasculitis or significant stenosis. SMA: Patent without evidence of aneurysm, dissection, vasculitis or significant stenosis. 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 All renal arteries are patent without evidence of aneurysm, dissection, vasculitis, fibromuscular dysplasia  or significant stenosis. IMA: Patent without evidence of aneurysm, dissection, vasculitis or significant stenosis. Inflow: Patent without evidence of aneurysm, dissection, vasculitis or significant stenosis. Veins: No obvious venous abnormality within the limitations of this arterial phase study. Review of the MIP images confirms the above findings. NON-VASCULAR Hepatobiliary: The liver is normal in size. Non-cirrhotic configuration. No suspicious mass. No intrahepatic or extrahepatic bile duct dilation. Gallbladder is surgically absent. Pancreas: Unremarkable. No pancreatic ductal dilatation or surrounding inflammatory changes. Spleen: Within normal limits. No focal lesion. Adrenals/Urinary Tract: There is a stable 2.1 x 2.7 cm right adrenal adenoma. Stable left adrenal thickening without discrete nodule. No suspicious renal mass. There is a stable  partially exophytic hypoattenuating structure arising from the right kidney upper pole, which is too small to adequately characterize but unchanged since the prior PET-CT scan and was not FDG avid, favoring proteinaceous/hemorrhagic cyst. There is a smaller simple cyst in the left kidney upper pole, anteriorly. No obstructive uropathy on either side. There is small focus of dystrophic calcification along the right kidney lower pole, posterolaterally. Nephroureterolithiasis on either side. Urinary bladder is under distended, precluding optimal assessment. However, no large mass or stones identified. No perivesical fat stranding. Stomach/Bowel: There is a small diverticulum arising from the second part of duodenum. No disproportionate dilation of the small or large bowel loops. No evidence of abnormal bowel wall thickening or inflammatory changes. The appendix is unremarkable. There are scattered diverticula throughout the colon, without imaging signs of diverticulitis. Vascular/Lymphatic: No ascites or pneumoperitoneum. No abdominal or pelvic lymphadenopathy, by size criteria. No aneurysmal dilation of the major abdominal arteries. There are mild peripheral atherosclerotic vascular calcifications of the aorta and its major branches. Reproductive: The uterus is unremarkable. No large adnexal mass. Other: There is a tiny fat containing umbilical hernia. The soft tissues and abdominal wall are otherwise unremarkable. Musculoskeletal: No suspicious osseous lesions. There are mild multilevel degenerative changes in the visualized spine. Review of the MIP images confirms the above findings. IMPRESSION: 1. No acute aortic syndrome. No acute thoracic aortic intramural hematoma. No thoracoabdominal aortic aneurysm, dissection, penetrating atherosclerotic ulcer or vasculitis. No pulmonary embolism. 2. No acute inflammatory process identified within the chest, abdomen or pelvis. 3. There is a stable 4 x 6 mm noncalcified nodule  in the right upper lobe. No new suspicious lung nodule. 4. Slight interval decrease in the precarinal lymph node, currently measuring 2.0 x 2.5 cm, compatible with improving metastasis. 5. Stable right adrenal adenoma. 6. Multiple other nonacute observations, as described above. Aortic Atherosclerosis (ICD10-I70.0). Electronically Signed   By: Beula Brunswick M.D.   On: 07/21/2023 14:24   ECHOCARDIOGRAM COMPLETE Result Date: 07/21/2023    ECHOCARDIOGRAM REPORT   Patient Name:   Curlie Doughty Date of Exam: 07/21/2023 Medical Rec #:  [id number]        Height:       65.0 in Accession #:    [id number]       Weight:       276.0 lb Date of Birth:  16-Mar-1965        BSA:          2.268 m Patient Age:    58 years         BP:           108/76 mmHg Patient Gender: F                HR:           83 bpm. Exam  Location:  [facility] Procedure: 2D Echo, Cardiac Doppler and Color Doppler (Both Spectral and Color            Flow Doppler were utilized during procedure). Indications:     Atrial Fibrillation I48.91  History:         Patient has no prior history of Echocardiogram examinations.                  COPD. Lung cancer.  Sonographer:     Broadus Canes Referring Phys:  [id number] Devorah Fonder Diagnosing Phys: Belva Boyden MD  Sonographer Comments: Technically challenging study due to limited acoustic windows, suboptimal parasternal window and suboptimal apical window. IMPRESSIONS  1. Left ventricular ejection fraction, by estimation, is 60 to 65%. The left ventricle has normal function. The left ventricle has no regional wall motion abnormalities. Left ventricular diastolic parameters are indeterminate.  2. Right ventricular systolic function is normal. The right ventricular size is normal. There is normal pulmonary artery systolic pressure. The estimated right ventricular systolic pressure is 20.1 mmHg.  3. The mitral valve is normal in structure. No evidence of mitral valve regurgitation. No evidence of mitral stenosis.  4. The  aortic valve is normal in structure. Aortic valve regurgitation is not visualized. No aortic stenosis is present.  5. The inferior vena cava is dilated in size with >50% respiratory variability, suggesting right atrial pressure of 8 mmHg.  6. Rhythm is atrial fibrillation with RVR FINDINGS  Left Ventricle: Left ventricular ejection fraction, by estimation, is 60 to 65%. The left ventricle has normal function. The left ventricle has no regional wall motion abnormalities. Strain was performed and the global longitudinal strain is indeterminate. The left ventricular internal cavity size was normal in size. There is no left ventricular hypertrophy. Left ventricular diastolic parameters are indeterminate. Right Ventricle: The right ventricular size is normal. No increase in right ventricular wall thickness. Right ventricular systolic function is normal. There is normal pulmonary artery systolic pressure. The tricuspid regurgitant velocity is 1.94 m/s, and  with an assumed right atrial pressure of 5 mmHg, the estimated right ventricular systolic pressure is 20.1 mmHg. Left Atrium: Left atrial size was normal in size. Right Atrium: Right atrial size was normal in size. Pericardium: There is no evidence of pericardial effusion. Mitral Valve: The mitral valve is normal in structure. No evidence of mitral valve regurgitation. No evidence of mitral valve stenosis. Tricuspid Valve: The tricuspid valve is normal in structure. Tricuspid valve regurgitation is mild . No evidence of tricuspid stenosis. Aortic Valve: The aortic valve is normal in structure. Aortic valve regurgitation is not visualized. No aortic stenosis is present. Aortic valve mean gradient measures 4.5 mmHg. Aortic valve peak gradient measures 7.6 mmHg. Aortic valve area, by VTI measures 3.64 cm. Pulmonic Valve: The pulmonic valve was normal in structure. Pulmonic valve regurgitation is not visualized. No evidence of pulmonic stenosis. Aorta: The aortic root is  normal in size and structure. Venous: The inferior vena cava is dilated in size with greater than 50% respiratory variability, suggesting right atrial pressure of 8 mmHg. IAS/Shunts: No atrial level shunt detected by color flow Doppler. Additional Comments: 3D was performed not requiring image post processing on an independent workstation and was indeterminate.  LEFT VENTRICLE PLAX 2D LVIDd:         3.50 cm LVIDs:         2.50 cm LV PW:         0.90 cm LV IVS:  1.20 cm LVOT diam:     2.10 cm LV SV:         66 LV SV Index:   29 LVOT Area:     3.46 cm  LEFT ATRIUM           Index LA Vol (A2C): 43.2 ml 19.04 ml/m LA Vol (A4C): 40.4 ml 17.81 ml/m  AORTIC VALVE AV Area (Vmax):    3.15 cm AV Area (Vmean):   3.09 cm AV Area (VTI):     3.64 cm AV Vmax:           137.50 cm/s AV Vmean:          91.900 cm/s AV VTI:            0.181 m AV Peak Grad:      7.6 mmHg AV Mean Grad:      4.5 mmHg LVOT Vmax:         125.00 cm/s LVOT Vmean:        82.100 cm/s LVOT VTI:          0.190 m LVOT/AV VTI ratio: 1.05 MITRAL VALVE                TRICUSPID VALVE MV Area (PHT): 4.99 cm     TR Peak grad:   15.1 mmHg MV Decel Time: 152 msec     TR Vmax:        194.00 cm/s MV E velocity: 139.00 cm/s                             SHUNTS                             Systemic VTI:  0.19 m                             Systemic Diam: 2.10 cm Belva Boyden MD Electronically signed by Belva Boyden MD Signature Date/Time: 07/21/2023/10:59:25 AM    Final    DG Chest Port 1 View Result Date: 07/20/2023 CLINICAL DATA:  Shortness of breath. EXAM: PORTABLE CHEST 1 VIEW COMPARISON:  Jul 14, 2023 FINDINGS: There stable right-sided venous Port-A-Cath positioning. The cardiac silhouette is mildly enlarged and unchanged in size. Mild atelectatic changes are noted within the bilateral lung bases. No pleural effusion or pneumothorax is identified. The visualized skeletal structures are unremarkable. IMPRESSION: Stable cardiomegaly with mild bibasilar  atelectasis. Electronically Signed   By: Virgle Grime M.D.   On: 07/20/2023 21:43    Cardiac Studies   2D echo 07/21/2023 1. Left ventricular ejection fraction, by estimation, is 60 to 65%. The  left ventricle has normal function. The left ventricle has no regional  wall motion abnormalities. Left ventricular diastolic parameters are  indeterminate.   2. Right ventricular systolic function is normal. The right ventricular  size is normal. There is normal pulmonary artery systolic pressure. The  estimated right ventricular systolic pressure is 20.1 mmHg.   3. The mitral valve is normal in structure. No evidence of mitral valve  regurgitation. No evidence of mitral stenosis.   4. The aortic valve is normal in structure. Aortic valve regurgitation is  not visualized. No aortic stenosis is present.   5. The inferior vena cava is dilated in size with >50% respiratory  variability, suggesting right atrial pressure of 8 mmHg.   6.  Rhythm is atrial fibrillation with RVR    Patient Profile     58 y.o. 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  Assessment & Plan    New onset Atrial fibrillation with RVR - Patient presented to the emergency room after being cardioverted in the field by EMS with atrial fibrillation rates up to 200 -On presentation to the emergency room patient was sinus tachycardia -Approximately 4:15 in the AM on 07/21/2023 she converted back in atrial fibrillation with RVR -She was bolused with amiodarone and started on amiodarone drip continued with elevated rates -An additional amiodarone bolus was given and she was given an additional dose of digoxin -Echocardiogram revealed an LVEF of 60 to 65%, no RWMA, no  evidence of valvular abnormalities, normal pulmonary artery systolic pressure - TSH 0.430 -Large component driven by respiratory status - Continue with telemetry monitoring -Continued on apixaban 5 mg twice daily for CHA2DS2-VASc score of at least 2 for stroke prophylaxis -Not currently on beta-blocker therapy due to significant lung disease  Atypical chest pain -Pleuritic in nature -CTA of the chest PE protocol negative 5/6 -High-sensitivity troponins were negative -No ischemic changes concerning on EKG -Not consistent with ACS -Likely secondary to mass effect from known malignancy -CT dissection protocol was negative   Acute COPD exacerbation -continued on inhalers - No focal findings on chest x-ray -Avoid albuterol  given tachycardia -Continue with supportive care -Ongoing management per IM  Acute hypoxic respiratory failure -Continue O2 via nasal cannula -Continue breathing treatments -COVID-positive per PCR -Supportive care - Ongoing management per IM  Stage III SCC lung cancer -S/p radiation currently on chemo that is on hold - Continue to follow outpatient oncology - Chest CT revealed stable 4 x 6 mm noncalcified nodule in the right upper lobe, no new suspicious lung nodule  Tobacco abuse -Cessation recommended  Morbid obesity -BMI 45.93 -complicates prognoses     For questions or updates, please contact [facility] Please consult [email or web address] for contact info under        Signed, Jerni Selmer, NP  07/22/2023, 11:59 AM

## 2023-07-22 NOTE — Plan of Care (Signed)

## 2023-07-23 DIAGNOSIS — J441 Chronic obstructive pulmonary disease with (acute) exacerbation: Secondary | ICD-10-CM | POA: Diagnosis not present

## 2023-07-23 LAB — BASIC METABOLIC PANEL WITH GFR
Anion gap: 10 (ref 5–15)
BUN: 16 mg/dL (ref 6–20)
CO2: 23 mmol/L (ref 22–32)
Calcium: 8.5 mg/dL — ABNORMAL LOW (ref 8.9–10.3)
Chloride: 103 mmol/L (ref 98–111)
Creatinine, Ser: 0.61 mg/dL (ref 0.44–1.00)
GFR, Estimated: >60 mL/min (ref >60)
Glucose, Bld: 107 mg/dL — ABNORMAL HIGH (ref 70–99)
Potassium: 3.7 mmol/L (ref 3.5–5.1)
Sodium: 136 mmol/L (ref 135–145)

## 2023-07-23 MED ORDER — AMIODARONE HCL 200 MG PO TABS: 200.0000 mg | ORAL_TABLET | Freq: Every day | ORAL | Status: DC

## 2023-07-23 MED ORDER — AMIODARONE HCL 200 MG PO TABS
400.0000 mg | ORAL_TABLET | Freq: Two times a day (BID) | ORAL | Status: DC
  Administered 2023-07-23 – 2023-07-27 (×9): 400 mg via ORAL
  Filled 2023-07-23 (×9): qty 2

## 2023-07-23 NOTE — Progress Notes (Signed)
 Cardiology Progress Note  Patient ID: Brandi Fowler MRN: [id number] DOB: 04/14/1965 Date of Encounter: 07/23/2023 Primary Cardiologist: Belva Boyden, MD  Subjective   Chief Complaint: SOB  HPI: Reports she is short of breath.  Poor airway movement noted.  Telemetry shows sinus tachycardia heart rate 100s.  ROS:  All other ROS reviewed and negative. Pertinent positives noted in the HPI.     Telemetry  Overnight telemetry shows sinus tachycardia heart rate low 100s, which I personally reviewed.   ECG  The most recent ECG shows sinus tachycardia heart rate 114, which I personally reviewed.   Physical Exam   Vitals:   07/23/23 0003 07/23/23 0345 07/23/23 0737 07/23/23 1136  BP: 124/85 (!) 131/93 137/82 131/71  Pulse: 97 87 85 100  Resp: 20 19    Temp: 98.5 F (36.9 C) 98.1 F (36.7 C) (!) 97 F (36.1 C) 98.1 F (36.7 C)  TempSrc: Oral Axillary    SpO2: 98% 95% 98% 91%  Weight:      Height:        Intake/Output Summary (Last 24 hours) at 07/23/2023 1311 Last data filed at 07/23/2023 1035 Gross per 24 hour  Intake 662.11 ml  Output 800 ml  Net -137.89 ml       07/20/2023    9:10 PM 07/14/2023    9:43 AM 07/14/2023    8:39 AM  Last 3 Weights  Weight (lbs) 276 lb 273 lb 276 lb 6.4 oz  Weight (kg) 125.193 kg 123.832 kg 125.374 kg    Body mass index is 45.93 kg/m.  General: Well nourished, well developed, in no acute distress Head: Atraumatic, normal size  Eyes: PEERLA, EOMI  Neck: Supple, no JVD Endocrine: No thryomegaly Cardiac: Normal S1, S2; RRR; no murmurs, rubs, or gallops Lungs: Clear to auscultation bilaterally, no wheezing, rhonchi or rales  Abd: Soft, nontender, no hepatomegaly  Ext: No edema, pulses 2+ Musculoskeletal: No deformities, BUE and BLE strength normal and equal Skin: Warm and dry, no rashes   Neuro: Alert and oriented to person, place, time, and situation, CNII-XII grossly intact, no focal deficits  Psych: Normal mood and affect   Cardiac  Studies  TTE 07/21/2023  1. Left ventricular ejection fraction, by estimation, is 60 to 65%. The  left ventricle has normal function. The left ventricle has no regional  wall motion abnormalities. Left ventricular diastolic parameters are  indeterminate.   2. Right ventricular systolic function is normal. The right ventricular  size is normal. There is normal pulmonary artery systolic pressure. The  estimated right ventricular systolic pressure is 20.1 mmHg.   3. The mitral valve is normal in structure. No evidence of mitral valve  regurgitation. No evidence of mitral stenosis.   4. The aortic valve is normal in structure. Aortic valve regurgitation is  not visualized. No aortic stenosis is present.   5. The inferior vena cava is dilated in size with >50% respiratory  variability, suggesting right atrial pressure of 8 mmHg.   6. Rhythm is atrial fibrillation with RVR   Patient Profile  Brandi Fowler is a 58 y.o. female with COPD, lung cancer (on chemotherapy), schizophrenia, GERD, OSA admitted on 07/20/2023 with acute COPD exacerbation and A-fib with RVR.  Assessment & Plan   # A-fib with RVR - Driven by current COPD exacerbation.  Underwent cardioversion in the ER.  Also was given amiodarone .  Seems to be in sinus rhythm.  Stop amiodarone  drip.  Plan for a 1 month course  of oral amiodarone .  This will help keep her in rhythm while she gets to her respiratory illness.  400 mg twice daily for 7 days followed by 200 mg daily for 21 days then stop. - Echo is reassuring with normal LV function. - Continue Eliquis  5 mg twice daily.  # Acute hypoxic respiratory failure # Acute COPD exacerbation - Per hospital team.  # Stage III small cell lung cancer - On outpatient chemotherapy.  # Elevated BNP Does not appear volume overloaded.  Was given Lasix  yesterday.  Hold further diuresis.  She does not need this.  # Chest pain, noncardiac - Troponins are negative.  Here with COPD  exacerbation.  I believe her respiratory illness explains her chest pain.  This does not appear to be cardiac in nature.      For questions or updates, please contact [facility] Please consult [email or web address] for contact info under     Signed, Melodee Spruce T. Rolm Clos, MD, [facility]  [facility]  07/23/2023 1:11 PM

## 2023-07-23 NOTE — Plan of Care (Signed)
  Problem: Education: Goal: Knowledge of General Education information will improve Description: Including pain rating scale, medication(s)/side effects and non-pharmacologic comfort measures Outcome: Progressing   Problem: Health Behavior/Discharge Planning: Goal: Ability to manage health-related needs will improve Outcome: Progressing   Problem: Clinical Measurements: Goal: Ability to maintain clinical measurements within normal limits will improve Outcome: Progressing Goal: Diagnostic test results will improve Outcome: Progressing Goal: Respiratory complications will improve Outcome: Progressing Goal: Cardiovascular complication will be avoided Outcome: Progressing   Problem: Nutrition: Goal: Adequate nutrition will be maintained Outcome: Progressing   Problem: Coping: Goal: Level of anxiety will decrease Outcome: Progressing   Problem: Pain Managment: Goal: General experience of comfort will improve and/or be controlled Outcome: Progressing   Problem: Education: Goal: Knowledge of risk factors and measures for prevention of condition will improve Outcome: Progressing   Problem: Respiratory: Goal: Will maintain a patent airway Outcome: Progressing

## 2023-07-23 NOTE — Progress Notes (Signed)
 PROGRESS NOTE    Brandi Fowler  ZOX:[id number] DOB: 03-17-1965 DOA: 07/20/2023 PCP: [facility]  Chief Complaint  Patient presents with   Shortness of Breath    Hospital Course:  Brandi Fowler is a 58 year old female with COPD, lung cancer, schizophrenia, GERD, urolithiasis, OSA, seizure disorder, PVD, PTSD, depression, degenerative disc disease, presents to the ED with acute onset dyspnea and cough.  Arrival to the ED she was found to be in A-fib RVR 200s, she was cardioverted by EMS and returned to sinus rhythm.  In the ED she received 2 g mag sulfate, DuoNebs, albuterol .  Subjective: Patient is much improved this morning.  She is intermittently using her CPAP.  She is able to carry conversations and though dyspneic is much improved from prior exam. Today visiting is her husband, her daughter, son-in-law, brother.  They have multiple questions regarding her health and plans moving forward.  I have ask all to the best of my ability  Objective: Vitals:   07/23/23 0003 07/23/23 0345 07/23/23 0737 07/23/23 1136  BP: 124/85 (!) 131/93 137/82 131/71  Pulse: 97 87 85 100  Resp: 20 19    Temp: 98.5 F (36.9 C) 98.1 F (36.7 C) (!) 97 F (36.1 C) 98.1 F (36.7 C)  TempSrc: Oral Axillary    SpO2: 98% 95% 98% 91%  Weight:      Height:        Intake/Output Summary (Last 24 hours) at 07/23/2023 1506 Last data filed at 07/23/2023 1035 Gross per 24 hour  Intake 662.11 ml  Output 800 ml  Net -137.89 ml   Filed Weights   07/20/23 2110  Weight: 125.2 kg    Examination: General exam: No acute distress, calm, appears comfortable with CPAP in place.  Some dyspnea after extended periods of talking. Respiratory system: Better aeration bilaterally, end expiratory wheezing  cardiovascular system: Rapid rate, regular rhythm Gastrointestinal system: Abdomen is nondistended, soft and nontender.  Neuro: Alert and oriented. No focal neurological deficits. Psychiatry:  Demonstrates appropriate judgement and insight. Mood & affect appropriate for situation.   Assessment & Plan:  Principal Problem:   COPD exacerbation (HCC) Active Problems:   Paroxysmal atrial fibrillation with RVR (HCC)   Seizure disorder (HCC)   Schizophrenia (HCC)   Morbid obesity (HCC)   Obstructive sleep apnea syndrome   Chronic pain   Lung cancer (HCC)   Pulmonary hypertension (HCC)   Restless leg syndrome   COVID-19   A-fib RVR, new - Initial rate 200s - Cardioverted with EMS, reportedly converted to sinus rhythm.  Then back to RVR - RVR likely triggered by respiratory distress and COVID-19 - Cardiology has been consulted - Has been on Amio drip.  Will discontinue this in favor of oral amiodarone . - Cardiology will plan for a 1 month course of oral amiodarone  while her respiratory illness resolves.  Currently for 400 mg 7 days followed by 200 daily for 21 days and then stopped - Echocardiogram: EF 60 to 65%, no RWMA, diastolic parameters indeterminate.  No noted valvular disease - Will continue with Eliquis  5 mg daily - TSH: WNL  Substernal chest pain - Intermittent, chronic.  Severe at times.  Responsive to opioids - CTA PE protocol 5/6: No PE - Pulmonology believes this may be secondary to mass effect from known malignancy - CT dissection protocol: Negative  COPD with exacerbation COVID-19 Acute on chronic hypoxic respiratory failure - Chronically on 2 L at home, currently maintaining O2 sats above 92%  on 2 L but has intermittent increased work of breathing requiring CPAP.  Significant improvement after CPAP therapy - Continue with CPAP as patient desires.  She is using her home device for greater comfort.  Did not tolerate hospital device - No focal findings on CXR consistent with pneumonia - No indication for antibiotics - Will continue with steroids - Pratropium only given RVR, as needed PACs as needed - Resume home Daliresp , Trelegy on hold  Stage III SCC  lung cancer - Status post radiation - Currently on chemotherapy, holding while admitted - Needs close outpatient follow-up with oncology  - CT this admission shows enlarged precarinal lymph node 2.0 x 2.5 cm, slightly decreased in the prior studies.  Compatible with metastasis.  Multiple additional mildly prominent mediastinal and bilateral hilar lymph nodes.  No new suspicious lung nodules.  Stable 4 x 6 mm noncalcified nodule in right upper lobe. - Oncology outpatient follow-up  Morbid obesity BMI 45 - Outpatient follow up for lifestyle modification and risk factor management  Pancytopenia Leukopenia Thrombocytopenia - Likely secondary to chemo, will complicate infectious picture is difficult to trend - Continue to follow CBC  Chronic pain -Patient endorses that she has had difficulty following up with outpatient pain management.  She reports her doctors are located in multiple different locations and that she has been missing some appointments due to difficulty with transportation - Will refer to [facility] at discharge - Will provide bridging Rx oxycodone   Restless leg syndrome - Resume home meds  OSA - CPAP at night  PTSD Depression - Resume all home meds  Tobacco abuse - Spent over 10 minutes on tobacco abuse cessation counseling today.  Reiterated these dangers especially in setting of active oxygen  use, lung cancer, COPD.  Patient endorses understanding.  Reports she has been trying to quit for 50 years without success.  DVT prophylaxis: Eliquis    Code Status: Full Code Disposition: Inpatient, still intermittently requiring Bipap, respiratory status remains tenuous.  Consultants:  Treatment Team:  Consulting Physician: Sheryle Donning, MD  Procedures:    Antimicrobials:  Anti-infectives (From admission, onward)    Start     Dose/Rate Route Frequency Ordered Stop   07/20/23 2345  cefTRIAXone  (ROCEPHIN ) 1 g in sodium chloride  0.9 % 100 mL  IVPB  Status:  Discontinued        1 g 200 mL/hr over 30 Minutes Intravenous Daily at bedtime 07/20/23 2329 07/21/23 1154       Data Reviewed: I have personally reviewed following labs and imaging studies CBC: Recent Labs  Lab 07/20/23 2113 07/21/23 0450 07/22/23 0550  WBC 4.7 3.2* 4.6  HGB 12.4 12.2 11.3*  HCT 36.5 38.2 32.9*  MCV 94.3 99.5 94.3  PLT 97* 86* 87*   Basic Metabolic Panel: Recent Labs  Lab 07/20/23 2113 07/21/23 0450 07/22/23 0550 07/23/23 0457  NA 135 136 139 136  K 3.9 4.1 3.8 3.7  CL 103 106 107 103  CO2 18* 20* 22 23  GLUCOSE 161* 153* 104* 107*  BUN 27* 21* 17 16  CREATININE 1.18* 0.87 0.68 0.61  CALCIUM 8.3* 8.0* 8.3* 8.5*   GFR: Estimated Creatinine Clearance: 99.5 mL/min (by C-G formula based on SCr of 0.61 mg/dL). Liver Function Tests: Recent Labs  Lab 07/20/23 2113  AST 30  ALT 39  ALKPHOS 67  BILITOT 0.8  PROT 6.6  ALBUMIN 3.0*   CBG: No results for input(s): "GLUCAP" in the last 168 hours.  Recent Results (from the past 240  hours)  Blood culture (routine x 2)     Status: None (Preliminary result)   Collection Time: 07/20/23  9:13 PM   Specimen: BLOOD  Result Value Ref Range Status   Specimen Description BLOOD BLOOD LEFT ARM  Final   Special Requests   Final    BOTTLES DRAWN AEROBIC AND ANAEROBIC Blood Culture results may not be optimal due to an inadequate volume of blood received in culture bottles   Culture   Final    NO GROWTH 3 DAYS Performed at [facility], [address] [place], [place] [address]    Report Status PENDING  Incomplete  Blood culture (routine x 2)     Status: None (Preliminary result)   Collection Time: 07/20/23  9:23 PM   Specimen: BLOOD  Result Value Ref Range Status   Specimen Description BLOOD BLOOD RIGHT ARM  Final   Special Requests   Final    BOTTLES DRAWN AEROBIC AND ANAEROBIC Blood Culture results may not be optimal due to an inadequate volume of blood received in culture  bottles   Culture   Final    NO GROWTH 3 DAYS Performed at [facility], [address], [place], [place] [address]    Report Status PENDING  Incomplete  SARS Coronavirus 2 by RT PCR (hospital order, performed in [facility] hospital lab) *cepheid single result test* Anterior Nasal Swab     Status: Abnormal   Collection Time: 07/21/23  8:45 AM   Specimen: Anterior Nasal Swab  Result Value Ref Range Status   SARS Coronavirus 2 by RT PCR POSITIVE (A) NEGATIVE Final    Comment: (NOTE) SARS-CoV-2 target nucleic acids are DETECTED  SARS-CoV-2 RNA is generally detectable in upper respiratory specimens  during the acute phase of infection.  Positive results are indicative  of the presence of the identified virus, but do not rule out bacterial infection or co-infection with other pathogens not detected by the test.  Clinical correlation with patient history and  other diagnostic information is necessary to determine patient infection status.  The expected result is negative.  Fact Sheet for Patients:   RoadLapTop.co.za   Fact Sheet for Healthcare Providers:   http://kim-miller.com/    This test is not yet approved or cleared by the [place]  FDA and  has been authorized for detection and/or diagnosis of SARS-CoV-2 by FDA under an Emergency Use Authorization (EUA).  This EUA will remain in effect (meaning this test can be used) for the duration of  the COVID-19 declaration under Section 564(b)(1)  of the Act, 21 U.S.C. section 360-bbb-3(b)(1), unless the authorization is terminated or revoked sooner.   Performed at [facility], [address] [place], [place] [address]   Respiratory (~20 pathogens) panel by PCR     Status: None   Collection Time: 07/21/23  9:15 AM   Specimen: Nasopharyngeal Swab; Respiratory  Result Value Ref Range Status   Adenovirus NOT DETECTED NOT DETECTED Final   Coronavirus 229E NOT  DETECTED NOT DETECTED Final    Comment: (NOTE) The Coronavirus on the Respiratory Panel, DOES NOT test for the novel  Coronavirus (2019 nCoV)    Coronavirus HKU1 NOT DETECTED NOT DETECTED Final   Coronavirus NL63 NOT DETECTED NOT DETECTED Final   Coronavirus OC43 NOT DETECTED NOT DETECTED Final   Metapneumovirus NOT DETECTED NOT DETECTED Final   Rhinovirus / Enterovirus NOT DETECTED NOT DETECTED Final   Influenza A NOT DETECTED NOT DETECTED Final   Influenza B NOT  DETECTED NOT DETECTED Final   Parainfluenza Virus 1 NOT DETECTED NOT DETECTED Final   Parainfluenza Virus 2 NOT DETECTED NOT DETECTED Final   Parainfluenza Virus 3 NOT DETECTED NOT DETECTED Final   Parainfluenza Virus 4 NOT DETECTED NOT DETECTED Final   Respiratory Syncytial Virus NOT DETECTED NOT DETECTED Final   Bordetella pertussis NOT DETECTED NOT DETECTED Final   Bordetella Parapertussis NOT DETECTED NOT DETECTED Final   Chlamydophila pneumoniae NOT DETECTED NOT DETECTED Final   Mycoplasma pneumoniae NOT DETECTED NOT DETECTED Final    Comment: Performed at [facility], [address] [place], [place] [address]     Radiology Studies: No results found.   Scheduled Meds:  amiodarone   400 mg Oral BID   Followed by   [name] ON 07/30/2023] amiodarone   200 mg Oral Daily   apixaban   5 mg Oral BID   budesonide -glycopyrrolate -formoterol   2 puff Inhalation BID   Chlorhexidine  Gluconate Cloth  6 each Topical Daily   Ensifentrine   1 vial Inhalation BID   guaiFENesin   600 mg Oral BID   ipratropium  2 puff Inhalation Q6H   levalbuterol   1 puff Inhalation Q6H   methylPREDNISolone  (SOLU-MEDROL ) injection  40 mg Intravenous Q12H   roflumilast   500 mcg Oral BH-q7a   rOPINIRole   1 mg Oral QHS   sodium chloride  flush  10-40 mL Intracatheter Q12H   Continuous Infusions:     LOS: 3 days  CRITICAL CARE Performed by: Roise Cleaver Total critical care time: 37 minutes Critical care time was exclusive of separately  billable procedures and treating other patients. Critical care was necessary to treat or prevent imminent or life-threatening deterioration. Critical care was time spent personally by me on the following activities: development of treatment plan with patient and/or surrogate as well as nursing, discussions with consultants, evaluation of patient's response to treatment, examination of patient, obtaining history from patient or surrogate, ordering and performing treatments and interventions, ordering and review of laboratory studies, ordering and review of radiographic studies, pulse oximetry and re-evaluation of patient's condition. *Patient is high risk of decompensation.  Currently in acute hypoxic respiratory failure.  Trialing CPAP/BiPAP therapy.  Close monitoring of respiratory status.  High risk of requiring intubation.  Respiratory status further complicated by A-fib RVR.  Being followed closely by cardiology.  Currently on amiodarone  drip  Rosalynd Mcwright, DO [facility]  To contact the attending physician between 7A-7P please use Epic Chat. To contact the covering physician during after hours 7P-7A, please review Amion.  07/23/2023, 3:06 PM   *This document has been created with the assistance of dictation software. Please excuse typographical errors. *

## 2023-07-23 NOTE — Plan of Care (Signed)
  Problem: Education: Goal: Knowledge of General Education information will improve Description: Including pain rating scale, medication(s)/side effects and non-pharmacologic comfort measures Outcome: Progressing   Problem: Health Behavior/Discharge Planning: Goal: Ability to manage health-related needs will improve Outcome: Progressing   Problem: Clinical Measurements: Goal: Ability to maintain clinical measurements within normal limits will improve Outcome: Progressing   Problem: Nutrition: Goal: Adequate nutrition will be maintained Outcome: Progressing   Problem: Coping: Goal: Level of anxiety will decrease Outcome: Progressing   Problem: Elimination: Goal: Will not experience complications related to bowel motility Outcome: Progressing Goal: Will not experience complications related to urinary retention Outcome: Progressing   Problem: Skin Integrity: Goal: Risk for impaired skin integrity will decrease Outcome: Progressing   Problem: Coping: Goal: Psychosocial and spiritual needs will be supported Outcome: Progressing   Problem: Education: Goal: Knowledge of disease or condition will improve Outcome: Progressing Goal: Knowledge of the prescribed therapeutic regimen will improve Outcome: Progressing   Problem: Activity: Goal: Will verbalize the importance of balancing activity with adequate rest periods Outcome: Progressing

## 2023-07-24 ENCOUNTER — Inpatient Hospital Stay

## 2023-07-24 DIAGNOSIS — J441 Chronic obstructive pulmonary disease with (acute) exacerbation: Secondary | ICD-10-CM | POA: Diagnosis not present

## 2023-07-24 LAB — BASIC METABOLIC PANEL WITH GFR
Anion gap: 9 (ref 5–15)
BUN: 21 mg/dL — ABNORMAL HIGH (ref 6–20)
CO2: 26 mmol/L (ref 22–32)
Calcium: 8.6 mg/dL — ABNORMAL LOW (ref 8.9–10.3)
Chloride: 105 mmol/L (ref 98–111)
Creatinine, Ser: 0.68 mg/dL (ref 0.44–1.00)
GFR, Estimated: >60 mL/min (ref >60)
Glucose, Bld: 114 mg/dL — ABNORMAL HIGH (ref 70–99)
Potassium: 3.6 mmol/L (ref 3.5–5.1)
Sodium: 140 mmol/L (ref 135–145)

## 2023-07-24 MED ORDER — FUROSEMIDE 10 MG/ML IJ SOLN
40.0000 mg | Freq: Once | INTRAMUSCULAR | Status: AC
  Administered 2023-07-24: 40 mg via INTRAVENOUS
  Filled 2023-07-24: qty 4

## 2023-07-24 MED ORDER — METOPROLOL TARTRATE 5 MG/5ML IV SOLN
5.0000 mg | Freq: Once | INTRAVENOUS | Status: DC
  Filled 2023-07-24: qty 5

## 2023-07-24 NOTE — Progress Notes (Signed)
 MD made aware of patient heart rate being in the 130-160s Afib RVR and converting to SR in the 90s.  Patient keeps going back and forth between both rhythms.  X1 iv metoprolol  ordered

## 2023-07-24 NOTE — Plan of Care (Signed)
  Problem: Education: Goal: Knowledge of General Education information will improve Description: Including pain rating scale, medication(s)/side effects and non-pharmacologic comfort measures Outcome: Progressing   Problem: Health Behavior/Discharge Planning: Goal: Ability to manage health-related needs will improve Outcome: Progressing   Problem: Clinical Measurements: Goal: Respiratory complications will improve Outcome: Progressing Goal: Cardiovascular complication will be avoided Outcome: Progressing   Problem: Activity: Goal: Risk for activity intolerance will decrease Outcome: Progressing   Problem: Coping: Goal: Level of anxiety will decrease Outcome: Progressing   Problem: Pain Managment: Goal: General experience of comfort will improve and/or be controlled Outcome: Progressing

## 2023-07-24 NOTE — Progress Notes (Signed)
 Pulled metoprolol  from pyxis went to give and patient is maintaining in the 90s in SR MD made aware and will continue to assess.

## 2023-07-24 NOTE — Progress Notes (Addendum)
 PROGRESS NOTE    Brandi Fowler  WJX:[id number] DOB: 1965-07-08 DOA: 07/20/2023 PCP: [facility]  Chief Complaint  Patient presents with   Shortness of Breath    Hospital Course:  Brandi Fowler is a 58 year old female with COPD, lung cancer, schizophrenia, GERD, urolithiasis, OSA, seizure disorder, PVD, PTSD, depression, degenerative disc disease, presents to the ED with acute onset dyspnea and cough.  Arrival to the ED she was found to be in A-fib RVR 200s, she was cardioverted by EMS and returned to sinus rhythm.  In the ED she received 2 g mag sulfate, DuoNebs, albuterol .  Subjective: Patient continues to complain of chest pain overnight.  She reports her morphine  is helping some.  She reports this is the same chest pain she has been experiencing for months.  She is still intermittently using her CPAP with some improvement.  Objective: Vitals:   07/23/23 2333 07/24/23 0325 07/24/23 0630 07/24/23 0838  BP: 123/77 105/75 117/70 (!) 132/92  Pulse: 95 89  87  Resp: 20 20  15   Temp: 97.6 F (36.4 C) 97.8 F (36.6 C)  97.8 F (36.6 C)  TempSrc:    Oral  SpO2: 96% 96%  91%  Weight:      Height:        Intake/Output Summary (Last 24 hours) at 07/24/2023 1452 Last data filed at 07/24/2023 1000 Gross per 24 hour  Intake 462.53 ml  Output 250 ml  Net 212.53 ml   Filed Weights   07/20/23 2110  Weight: 125.2 kg    Examination: General exam: Tearful, eating chicken wings on arrival. respiratory system: Better aeration bilaterally, end expiratory wheezing, pursed lip breathing, dyspnea with speaking and eating cardiovascular system: Rapid rate, regular rhythm Gastrointestinal system: obese abdomen Neuro: Alert and oriented. No focal neurological deficits. Psychiatry:. Mood & affect appropriate for situation.   Assessment & Plan:  Principal Problem:   COPD exacerbation (HCC) Active Problems:   Paroxysmal atrial fibrillation with RVR (HCC)   Seizure  disorder (HCC)   Schizophrenia (HCC)   Morbid obesity (HCC)   Obstructive sleep apnea syndrome   Chronic pain   Lung cancer (HCC)   Pulmonary hypertension (HCC)   Restless leg syndrome   COVID-19   A-fib RVR, new - Initial rate 200s - Cardioverted with EMS, reportedly converted to sinus rhythm.  Then back to RVR - RVR likely triggered by respiratory distress and COVID-19 - Cardiology has been consulted - Initially on Amio drip.  Now p.o. Amio. - Continue with Eliquis  twice daily - Will plan for 1 month course of oral Amio while respiratory illness resolves. Currently for 400 mg 7 days followed by 200 daily for 21 days and then stopped - Echocardiogram: EF 60 to 65%, no RWMA, diastolic parameters indeterminate.  No noted valvular disease -TSH within normal limits  COPD with exacerbation COVID-19 Acute on chronic hypoxic respiratory failure - Suspect her COPD is nearing end-stage - Chronically on 2 L [place] at home.  Maintaining sats above 92% but very dyspneic. - Continue with intermittent CPAP therapy which is providing her significant relief. - Patient complains of acute worsening today.  Chest x-ray without significant changes.  Perhaps some vascular congestion.  Have ordered 40 mg IV push Lasix  x 1 -No evidence of pneumonia.  No indication for antibiotics - Continue with steroids - Ipratropium only given RVR.  Xopenex  as needed - Resume home Daliresp  and Trelegy on hold - Maintain low threshold for transfer to ICU if  work of breathing becomes more severe.  Have discussed with the patient again and and she would desire intubation if needed  Substernal chest pain - Intermittent, chronic. - Responsive to opioids - Cardiac testing negative.  Does not appear to be cardiac in origin - CTA PE protocol and CT dissection negative. - Pulmonology consulted and believes this may be secondary to mass effect or radiation from known malignancy   Stage III SCC lung cancer - Status post  radiation - Currently on chemotherapy, holding while admitted - Needs close outpatient follow-up with oncology  - CT this admission shows enlarged precarinal lymph node 2.0 x 2.5 cm, slightly decreased in the prior studies.  Compatible with metastasis.  Multiple additional mildly prominent mediastinal and bilateral hilar lymph nodes.  No new suspicious lung nodules.  Stable 4 x 6 mm noncalcified nodule in right upper lobe.  Morbid obesity BMI 45 - Outpatient follow up for lifestyle modification and risk factor management  Pancytopenia Leukopenia Thrombocytopenia - Leukopenia has resolved to normal.  Suspect secondary to chemo which is currently on hold - Continue to follow CBC  Chronic pain -Patient endorses that she has had difficulty following up with outpatient pain management.  She reports her doctors are located in multiple different locations and that she has been missing some appointments due to difficulty with transportation - Will refer to [facility] at discharge - Will provide bridging Rx oxycodone   Restless leg syndrome - Resume home meds  OSA - CPAP at night  PTSD Depression - Resume all home meds  Tobacco abuse - Patient reports that she has been smoking since she is 58 years old.  She is down to 3 cigarettes a day.  Still endorses desire for cigarettes even while hospitalized.  Reports she has failed multiple prior attempts to quit.  She is grateful for opportunity of complete abstinence while admitted  DVT prophylaxis: Eliquis    Code Status: Full Code Disposition: Inpatient, still intermittently requiring Cpap, respiratory status remains tenuous.  Consultants:  Treatment Team:  Consulting Physician: Sheryle Donning, MD  Procedures:    Antimicrobials:  Anti-infectives (From admission, onward)    Start     Dose/Rate Route Frequency Ordered Stop   07/20/23 2345  cefTRIAXone  (ROCEPHIN ) 1 g in sodium chloride  0.9 % 100 mL IVPB  Status:   Discontinued        1 g 200 mL/hr over 30 Minutes Intravenous Daily at bedtime 07/20/23 2329 07/21/23 1154       Data Reviewed: I have personally reviewed following labs and imaging studies CBC: Recent Labs  Lab 07/20/23 2113 07/21/23 0450 07/22/23 0550  WBC 4.7 3.2* 4.6  HGB 12.4 12.2 11.3*  HCT 36.5 38.2 32.9*  MCV 94.3 99.5 94.3  PLT 97* 86* 87*   Basic Metabolic Panel: Recent Labs  Lab 07/20/23 2113 07/21/23 0450 07/22/23 0550 07/23/23 0457 07/24/23 0509  NA 135 136 139 136 140  K 3.9 4.1 3.8 3.7 3.6  CL 103 106 107 103 105  CO2 18* 20* 22 23 26   GLUCOSE 161* 153* 104* 107* 114*  BUN 27* 21* 17 16 21*  CREATININE 1.18* 0.87 0.68 0.61 0.68  CALCIUM 8.3* 8.0* 8.3* 8.5* 8.6*   GFR: Estimated Creatinine Clearance: 99.5 mL/min (by C-G formula based on SCr of 0.68 mg/dL). Liver Function Tests: Recent Labs  Lab 07/20/23 2113  AST 30  ALT 39  ALKPHOS 67  BILITOT 0.8  PROT 6.6  ALBUMIN 3.0*   CBG: No results for  input(s): "GLUCAP" in the last 168 hours.  Recent Results (from the past 240 hours)  Blood culture (routine x 2)     Status: None (Preliminary result)   Collection Time: 07/20/23  9:13 PM   Specimen: BLOOD  Result Value Ref Range Status   Specimen Description BLOOD BLOOD LEFT ARM  Final   Special Requests   Final    BOTTLES DRAWN AEROBIC AND ANAEROBIC Blood Culture results may not be optimal due to an inadequate volume of blood received in culture bottles   Culture   Final    NO GROWTH 4 DAYS Performed at [facility], [address], [place], [place] [address]    Report Status PENDING  Incomplete  Blood culture (routine x 2)     Status: None (Preliminary result)   Collection Time: 07/20/23  9:23 PM   Specimen: BLOOD  Result Value Ref Range Status   Specimen Description BLOOD BLOOD RIGHT ARM  Final   Special Requests   Final    BOTTLES DRAWN AEROBIC AND ANAEROBIC Blood Culture results may not be optimal due to an inadequate volume  of blood received in culture bottles   Culture   Final    NO GROWTH 4 DAYS Performed at [facility], [address] [place], [place] [address]    Report Status PENDING  Incomplete  SARS Coronavirus 2 by RT PCR (hospital order, performed in [facility] hospital lab) *cepheid single result test* Anterior Nasal Swab     Status: Abnormal   Collection Time: 07/21/23  8:45 AM   Specimen: Anterior Nasal Swab  Result Value Ref Range Status   SARS Coronavirus 2 by RT PCR POSITIVE (A) NEGATIVE Final    Comment: (NOTE) SARS-CoV-2 target nucleic acids are DETECTED  SARS-CoV-2 RNA is generally detectable in upper respiratory specimens  during the acute phase of infection.  Positive results are indicative  of the presence of the identified virus, but do not rule out bacterial infection or co-infection with other pathogens not detected by the test.  Clinical correlation with patient history and  other diagnostic information is necessary to determine patient infection status.  The expected result is negative.  Fact Sheet for Patients:   RoadLapTop.co.za   Fact Sheet for Healthcare Providers:   http://kim-miller.com/    This test is not yet approved or cleared by the [place]  FDA and  has been authorized for detection and/or diagnosis of SARS-CoV-2 by FDA under an Emergency Use Authorization (EUA).  This EUA will remain in effect (meaning this test can be used) for the duration of  the COVID-19 declaration under Section 564(b)(1)  of the Act, 21 U.S.C. section 360-bbb-3(b)(1), unless the authorization is terminated or revoked sooner.   Performed at [facility], [address] [place], [place] [address]   Respiratory (~20 pathogens) panel by PCR     Status: None   Collection Time: 07/21/23  9:15 AM   Specimen: Nasopharyngeal Swab; Respiratory  Result Value Ref Range Status   Adenovirus NOT DETECTED NOT DETECTED Final    Coronavirus 229E NOT DETECTED NOT DETECTED Final    Comment: (NOTE) The Coronavirus on the Respiratory Panel, DOES NOT test for the novel  Coronavirus (2019 nCoV)    Coronavirus HKU1 NOT DETECTED NOT DETECTED Final   Coronavirus NL63 NOT DETECTED NOT DETECTED Final   Coronavirus OC43 NOT DETECTED NOT DETECTED Final   Metapneumovirus NOT DETECTED NOT DETECTED Final   Rhinovirus / Enterovirus NOT DETECTED NOT DETECTED Final  Influenza A NOT DETECTED NOT DETECTED Final   Influenza B NOT DETECTED NOT DETECTED Final   Parainfluenza Virus 1 NOT DETECTED NOT DETECTED Final   Parainfluenza Virus 2 NOT DETECTED NOT DETECTED Final   Parainfluenza Virus 3 NOT DETECTED NOT DETECTED Final   Parainfluenza Virus 4 NOT DETECTED NOT DETECTED Final   Respiratory Syncytial Virus NOT DETECTED NOT DETECTED Final   Bordetella pertussis NOT DETECTED NOT DETECTED Final   Bordetella Parapertussis NOT DETECTED NOT DETECTED Final   Chlamydophila pneumoniae NOT DETECTED NOT DETECTED Final   Mycoplasma pneumoniae NOT DETECTED NOT DETECTED Final    Comment: Performed at [facility], [address] [place], [place] [address]     Radiology Studies: DG Chest Port 1 View Result Date: 07/24/2023 CLINICAL DATA:  Dyspnea, cough. EXAM: PORTABLE CHEST 1 VIEW COMPARISON:  Jul 20, 2023. FINDINGS: Stable cardiomediastinal silhouette. Right internal jugular Port-A-Cath is unchanged. Minimal bibasilar subsegmental atelectasis is noted. Bony thorax is unremarkable. IMPRESSION: Minimal bibasilar subsegmental atelectasis. Electronically Signed   By: Rosalene Colon M.D.   On: 07/24/2023 10:24     Scheduled Meds:  amiodarone   400 mg Oral BID   Followed by   [name] ON 07/30/2023] amiodarone   200 mg Oral Daily   apixaban   5 mg Oral BID   budesonide -glycopyrrolate -formoterol   2 puff Inhalation BID   Chlorhexidine  Gluconate Cloth  6 each Topical Daily   Ensifentrine   1 vial Inhalation BID   guaiFENesin   600 mg Oral BID    ipratropium  2 puff Inhalation Q6H   levalbuterol   1 puff Inhalation Q6H   methylPREDNISolone  (SOLU-MEDROL ) injection  40 mg Intravenous Q12H   roflumilast   500 mcg Oral BH-q7a   rOPINIRole   1 mg Oral QHS   sodium chloride  flush  10-40 mL Intracatheter Q12H   Continuous Infusions:     LOS: 4 days  MDM: Patient is high risk for one or more organ failure.  They necessitate ongoing hospitalization for continued IV therapies and subsequent lab monitoring. Total time spent interpreting labs and vitals, coordinating care amongst consultants and care team members, directly assessing and discussing care with the patient and/or family: 55 min  Brandi Maranto, DO [facility]  To contact the attending physician between 7A-7P please use Epic Chat. To contact the covering physician during after hours 7P-7A, please review Amion.  07/24/2023, 2:52 PM   *This document has been created with the assistance of dictation software. Please excuse typographical errors. *

## 2023-07-24 NOTE — Plan of Care (Signed)
  Problem: Education: Goal: Knowledge of General Education information will improve Description: Including pain rating scale, medication(s)/side effects and non-pharmacologic comfort measures Outcome: Not Progressing   Problem: Health Behavior/Discharge Planning: Goal: Ability to manage health-related needs will improve Outcome: Not Progressing   Problem: Clinical Measurements: Goal: Ability to maintain clinical measurements within normal limits will improve Outcome: Not Progressing Goal: Will remain free from infection Outcome: Not Progressing Goal: Diagnostic test results will improve Outcome: Not Progressing Goal: Respiratory complications will improve Outcome: Not Progressing Goal: Cardiovascular complication will be avoided Outcome: Not Progressing   Problem: Activity: Goal: Risk for activity intolerance will decrease Outcome: Not Progressing   Problem: Nutrition: Goal: Adequate nutrition will be maintained Outcome: Not Progressing   Problem: Coping: Goal: Level of anxiety will decrease Outcome: Not Progressing   Problem: Elimination: Goal: Will not experience complications related to bowel motility Outcome: Not Progressing Goal: Will not experience complications related to urinary retention Outcome: Not Progressing   Problem: Pain Managment: Goal: General experience of comfort will improve and/or be controlled Outcome: Not Progressing   Problem: Safety: Goal: Ability to remain free from injury will improve Outcome: Not Progressing   Problem: Skin Integrity: Goal: Risk for impaired skin integrity will decrease Outcome: Not Progressing   Problem: Education: Goal: Knowledge of risk factors and measures for prevention of condition will improve Outcome: Not Progressing   Problem: Coping: Goal: Psychosocial and spiritual needs will be supported Outcome: Not Progressing   Problem: Respiratory: Goal: Will maintain a patent airway Outcome: Not  Progressing Goal: Complications related to the disease process, condition or treatment will be avoided or minimized Outcome: Not Progressing   Problem: Education: Goal: Knowledge of disease or condition will improve Outcome: Not Progressing Goal: Knowledge of the prescribed therapeutic regimen will improve Outcome: Not Progressing Goal: Individualized Educational Video(s) Outcome: Not Progressing   Problem: Activity: Goal: Ability to tolerate increased activity will improve Outcome: Not Progressing Goal: Will verbalize the importance of balancing activity with adequate rest periods Outcome: Not Progressing   Problem: Respiratory: Goal: Ability to maintain a clear airway will improve Outcome: Not Progressing Goal: Levels of oxygenation will improve Outcome: Not Progressing Goal: Ability to maintain adequate ventilation will improve Outcome: Not Progressing

## 2023-07-24 NOTE — Progress Notes (Signed)
 Cardiology Progress Note  Patient ID: Brandi Fowler MRN: [id number] DOB: [birth year] Date of Encounter: 07/24/2023 Primary Cardiologist: Belva Boyden, MD  Subjective   Chief Complaint: SOB  HPI: Reports shortness of breath and chest pain.  She tells me she developed chest pain this evening.  ROS:  All other ROS reviewed and negative. Pertinent positives noted in the HPI.     Telemetry  Overnight telemetry shows sinus tachycardia heart rate 100s, which I personally reviewed.   ECG  The most recent ECG shows A-fib heart rate 173, which I personally reviewed.   Physical Exam   Vitals:   07/23/23 2333 07/24/23 0325 07/24/23 0630 07/24/23 0838  BP: 123/77 105/75 117/70 (!) 132/92  Pulse: 95 89  87  Resp: 20 20  15   Temp: 97.6 F (36.4 C) 97.8 F (36.6 C)  97.8 F (36.6 C)  TempSrc:    Oral  SpO2: 96% 96%  91%  Weight:      Height:        Intake/Output Summary (Last 24 hours) at 07/24/2023 1058 Last data filed at 07/24/2023 1000 Gross per 24 hour  Intake 462.53 ml  Output 250 ml  Net 212.53 ml       07/20/2023    9:10 PM 07/14/2023    9:43 AM 07/14/2023    8:39 AM  Last 3 Weights  Weight (lbs) 276 lb 273 lb 276 lb 6.4 oz  Weight (kg) 125.193 kg 123.832 kg 125.374 kg    Body mass index is 45.93 kg/m.  General: Well nourished, well developed, in no acute distress Head: Atraumatic, normal size  Eyes: PEERLA, EOMI  Neck: Supple, no JVD Endocrine: No thryomegaly Cardiac: Normal S1, S2; RRR; no murmurs, rubs, or gallops Lungs: Diminished breath sounds bilaterally Abd: Soft, nontender, no hepatomegaly  Ext: No edema, pulses 2+ Musculoskeletal: No deformities, BUE and BLE strength normal and equal Skin: Warm and dry, no rashes   Neuro: Alert and oriented to person, place, time, and situation, CNII-XII grossly intact, no focal deficits  Psych: Normal mood and affect   Cardiac Studies  TTE 07/21/2023  1. Left ventricular ejection fraction, by estimation, is 60 to  65%. The  left ventricle has normal function. The left ventricle has no regional  wall motion abnormalities. Left ventricular diastolic parameters are  indeterminate.   2. Right ventricular systolic function is normal. The right ventricular  size is normal. There is normal pulmonary artery systolic pressure. The  estimated right ventricular systolic pressure is 20.1 mmHg.   3. The mitral valve is normal in structure. No evidence of mitral valve  regurgitation. No evidence of mitral stenosis.   4. The aortic valve is normal in structure. Aortic valve regurgitation is  not visualized. No aortic stenosis is present.   5. The inferior vena cava is dilated in size with >50% respiratory  variability, suggesting right atrial pressure of 8 mmHg.   6. Rhythm is atrial fibrillation with RVR   Patient Profile  Brandi Fowler is a 58 y.o. female with COPD, lung cancer (on chemotherapy), schizophrenia, GERD, OSA admitted on 07/20/2023 with acute COPD exacerbation and A-fib with RVR.   Assessment & Plan   # A-fib with RVR - Admitted with COPD exacerbation and COVID infection.  Likely trigger A-fib. - Has transition to oral amiodarone .  We will plan for 1 month of therapy to keep her in rhythm.  Telemetry shows sinus tachycardia.  She still is coughing with congestion.  I do not  think this is arrhythmia. Continue Eliquis  5 mg twice daily. -Echo shows normal LV function which is reassuring.  # Acute hypoxic respiratory failure # Acute COPD exacerbation # COVID-19 infection - Per primary team  # Stage III lung cancer - On outpatient chemo.  # Chest pain, noncardiac - She suffers from chronic pain.  Tells me has back pain and leg pain.  Had chest pain that started this morning.  Troponins have been negative.  EKG is normal.  She also describes hemoptysis.  I believe her chest discomfort is likely driven by respiratory illness.  I do not believe this is cardiac.  # Elevated BNP - Does not appear  volume overloaded.  Suspect BNP elevation was related to A-fib with RVR.  Hold further diuresis.   For questions or updates, please contact [facility] Please consult [email or web address] for contact info under    Signed, Melodee Spruce T. Rolm Clos, MD, [facility] [place]  [facility]  07/24/2023 10:58 AM

## 2023-07-25 ENCOUNTER — Ambulatory Visit

## 2023-07-25 ENCOUNTER — Encounter

## 2023-07-25 DIAGNOSIS — I480 Paroxysmal atrial fibrillation: Secondary | ICD-10-CM | POA: Diagnosis not present

## 2023-07-25 DIAGNOSIS — J441 Chronic obstructive pulmonary disease with (acute) exacerbation: Secondary | ICD-10-CM | POA: Diagnosis not present

## 2023-07-25 LAB — CULTURE, BLOOD (ROUTINE X 2)
Culture: NO GROWTH
Culture: NO GROWTH

## 2023-07-25 LAB — BASIC METABOLIC PANEL WITH GFR
Anion gap: 9 (ref 5–15)
BUN: 32 mg/dL — ABNORMAL HIGH (ref 6–20)
CO2: 26 mmol/L (ref 22–32)
Calcium: 8.7 mg/dL — ABNORMAL LOW (ref 8.9–10.3)
Chloride: 103 mmol/L (ref 98–111)
Creatinine, Ser: 0.94 mg/dL (ref 0.44–1.00)
GFR, Estimated: >60 mL/min (ref >60)
Glucose, Bld: 144 mg/dL — ABNORMAL HIGH (ref 70–99)
Potassium: 3.8 mmol/L (ref 3.5–5.1)
Sodium: 138 mmol/L (ref 135–145)

## 2023-07-25 LAB — MAGNESIUM: Magnesium: 1.9 mg/dL (ref 1.7–2.4)

## 2023-07-25 MED ORDER — ACETAMINOPHEN 650 MG RE SUPP: 650.0000 mg | Freq: Four times a day (QID) | RECTAL | Status: DC | PRN

## 2023-07-25 MED ORDER — LEVALBUTEROL TARTRATE 45 MCG/ACT IN AERO
1.0000 | INHALATION_SPRAY | Freq: Four times a day (QID) | RESPIRATORY_TRACT | Status: DC
  Administered 2023-07-25 – 2023-07-27 (×9): 1 via RESPIRATORY_TRACT
  Filled 2023-07-25: qty 1

## 2023-07-25 MED ORDER — ACETAMINOPHEN 325 MG PO TABS: 650.0000 mg | ORAL_TABLET | Freq: Four times a day (QID) | ORAL | Status: DC | PRN

## 2023-07-25 MED ORDER — LORAZEPAM 1 MG PO TABS: 1.0000 mg | ORAL_TABLET | Freq: Two times a day (BID) | ORAL | Status: DC | PRN

## 2023-07-25 MED ORDER — DIPHENHYDRAMINE HCL 25 MG PO CAPS
25.0000 mg | ORAL_CAPSULE | Freq: Four times a day (QID) | ORAL | Status: DC | PRN
  Administered 2023-07-25 – 2023-07-27 (×2): 25 mg via ORAL
  Filled 2023-07-25 (×2): qty 1

## 2023-07-25 NOTE — Progress Notes (Signed)
 PROGRESS NOTE    Brandi Fowler  FAO:[id number] DOB: 06/10/1965 DOA: 07/20/2023 PCP: Inc, [facility]  Chief Complaint  Patient presents with   Shortness of Breath    Hospital Course:  MINDY GALI is a 58 year old female with COPD, lung cancer, schizophrenia, GERD, urolithiasis, OSA, seizure disorder, PVD, PTSD, depression, degenerative disc disease, presents to the ED with acute onset dyspnea and cough.  Arrival to the ED she was found to be in A-fib RVR 200s, she was cardioverted by EMS and returned to sinus rhythm.  In the ED she received 2 g mag sulfate, DuoNebs, albuterol .  Subjective: Patient has some complaints about food services overnight.  Otherwise reports that her pain medications appear to be working.  Objective: Vitals:   07/25/23 0023 07/25/23 0400 07/25/23 0845 07/25/23 1226  BP: 103/73 111/77 99/72 139/81  Pulse: 98 91 77 84  Resp: 18 18 16 17   Temp: 98.4 F (36.9 C) 98.1 F (36.7 C) 98.1 F (36.7 C) 97.8 F (36.6 C)  TempSrc: Oral Axillary Oral Oral  SpO2: 94% 98% 95% 98%  Weight:      Height:        Intake/Output Summary (Last 24 hours) at 07/25/2023 1345 Last data filed at 07/25/2023 0945 Gross per 24 hour  Intake 350 ml  Output --  Net 350 ml   Filed Weights   07/20/23 2110  Weight: 125.2 kg    Examination: General exam: Tearful, eating chicken wings on arrival. respiratory system: Better aeration bilaterally, end expiratory wheezing, pursed lip breathing, dyspnea with speaking and eating cardiovascular system: Rapid rate, regular rhythm Gastrointestinal system: obese abdomen Neuro: Alert and oriented. No focal neurological deficits. Psychiatry:. Mood & affect appropriate for situation.   Assessment & Plan:  Principal Problem:   COPD exacerbation (HCC) Active Problems:   Paroxysmal atrial fibrillation with RVR (HCC)   Seizure disorder (HCC)   Schizophrenia (HCC)   Morbid obesity (HCC)   Obstructive sleep apnea  syndrome   Chronic pain   Lung cancer (HCC)   Pulmonary hypertension (HCC)   Restless leg syndrome   COVID-19   A-fib RVR, new - Initial rate 200s - Cardioverted with EMS, reportedly converted to sinus rhythm.  Then back to RVR - RVR likely triggered by respiratory distress and COVID-19 - Cardiology has been consulted - Initially on amnio drip, now on p.o. amnio. - Cardiology recommending 1 month course of oral amiodarone  while respiratory illness resolves. - Currently 400 mg x 7 days followed by 200 daily x 21 days then stop. - Continues to have some breakthrough tachycardia, mostly sinus, occasionally A-fib. - Continue close monitoring on telemetry - Cardiology reports not a candidate for A-fib ablation due to lung disease and lung cancer. - Currently on Eliquis , continue - Echocardiogram: EF 60 to 65%, no RWMA, diastolic parameters indeterminate.  No noted valvular disease -TSH within normal limits  COPD with exacerbation COVID-19 Acute on chronic hypoxic respiratory failure -COPD appears to be at end-stage. - She is chronically on 2 L nasal cannula.  Maintains O2 sats above 92% but is very dyspneic and tachypneic with minimal exertion - Continue with intermittent CPAP therapy which provides her significant relief - Repeat chest x-ray without significant changes. - In setting of end-stage COPD, lung cancer, and acute COVID infection, anticipate her lung function will be slow to resolve to baseline if at all. - No evidence of pneumonia, no indication for antibiotics - Will continue with current dose steroids - Continue with  ipratropium only given RVR.  Xopenex  as needed - Continue home Daliresp  and hold Trelegy for now - Patient is on day 5 of COVID he begins to improve.  Maintain low threshold for transfer to ICU for intubation if breathing becomes more unstable.    Substernal chest pain - Intermittent, chronic. - Unclear etiology. - Cardiac testing has been negative. -  CTA PE and CT dissection negative - Pulmonology was consulted and believes this may be secondary to mass effect or radiation from malignancy. - Pain is responsive to combination of opioids and Zanaflex .  Patient reports since addition of Zanaflex  she has had drastic decrease in pain levels.  Stage III SCC lung cancer - Status post radiation - Currently on chemotherapy outpatient.  Holding while admitted - Needs close outpatient follow-up with oncology - CT this admission shows enlarged precarinal lymph node 2.0 x 2.5 cm, slightly decreased in the prior studies.  Compatible with metastasis.  Multiple additional mildly prominent mediastinal and bilateral hilar lymph nodes.  No new suspicious lung nodules.  Stable 4 x 6 mm noncalcified nodule in right upper lobe.  Morbid obesity BMI 45 -Complicating the above.  Certainly some component of obesity hypoventilation syndrome - Outpatient follow up for lifestyle modification and risk factor management  Pancytopenia Leukopenia Thrombocytopenia - Leukopenia has resolved to normal.  Suspect secondary to chemo which is currently on hold - Continue to follow CBC  Chronic pain -Patient endorses that she has had difficulty following up with outpatient pain management.  She reports her doctors are located in multiple different locations and that she has been missing some appointments due to difficulty with transportation - Will refer to [place] pain management at discharge - Will provide bridging Rx oxycodone   Restless leg syndrome - Resume home meds  OSA - CPAP at night  PTSD Depression - Resume all home meds  Tobacco abuse - Patient reports that she has been smoking since she is 58 years old.  She is down to 3 cigarettes a day.  Still endorses desire for cigarettes even while hospitalized.  Reports she has failed multiple prior attempts to quit.  She is grateful for opportunity of complete abstinence while admitted  DVT prophylaxis:  Eliquis    Code Status: Full Code Disposition: Inpatient.  Respiratory status remains tenuous.  Continue close monitoring and medication titration as needed.  Will eventually need home health.  Will likely also need help securing home O2 which she reports has been difficult.  TOC has been consulted.  Consultants:  Treatment Team:  Consulting Physician: Sheryle Donning, MD  Procedures:    Antimicrobials:  Anti-infectives (From admission, onward)    Start     Dose/Rate Route Frequency Ordered Stop   07/20/23 2345  cefTRIAXone  (ROCEPHIN ) 1 g in sodium chloride  0.9 % 100 mL IVPB  Status:  Discontinued        1 g 200 mL/hr over 30 Minutes Intravenous Daily at bedtime 07/20/23 2329 07/21/23 1154       Data Reviewed: I have personally reviewed following labs and imaging studies CBC: Recent Labs  Lab 07/20/23 2113 07/21/23 0450 07/22/23 0550  WBC 4.7 3.2* 4.6  HGB 12.4 12.2 11.3*  HCT 36.5 38.2 32.9*  MCV 94.3 99.5 94.3  PLT 97* 86* 87*   Basic Metabolic Panel: Recent Labs  Lab 07/21/23 0450 07/22/23 0550 07/23/23 0457 07/24/23 0509 07/25/23 0320  NA 136 139 136 140 138  K 4.1 3.8 3.7 3.6 3.8  CL 106 107 103 105 103  CO2 20* 22 23 26 26   GLUCOSE 153* 104* 107* 114* 144*  BUN 21* 17 16 21* 32*  CREATININE 0.87 0.68 0.61 0.68 0.94  CALCIUM 8.0* 8.3* 8.5* 8.6* 8.7*  MG  --   --   --   --  1.9   GFR: Estimated Creatinine Clearance: 84.7 mL/min (by C-G formula based on SCr of 0.94 mg/dL). Liver Function Tests: Recent Labs  Lab 07/20/23 2113  AST 30  ALT 39  ALKPHOS 67  BILITOT 0.8  PROT 6.6  ALBUMIN 3.0*   CBG: No results for input(s): "GLUCAP" in the last 168 hours.  Recent Results (from the past 240 hours)  Blood culture (routine x 2)     Status: None   Collection Time: 07/20/23  9:13 PM   Specimen: BLOOD  Result Value Ref Range Status   Specimen Description BLOOD BLOOD LEFT ARM  Final   Special Requests   Final    BOTTLES DRAWN AEROBIC AND  ANAEROBIC Blood Culture results may not be optimal due to an inadequate volume of blood received in culture bottles   Culture   Final    NO GROWTH 5 DAYS Performed at [facility], [address] [place], [place] [address]    Report Status 07/25/2023 FINAL  Final  Blood culture (routine x 2)     Status: None   Collection Time: 07/20/23  9:23 PM   Specimen: BLOOD  Result Value Ref Range Status   Specimen Description BLOOD BLOOD RIGHT ARM  Final   Special Requests   Final    BOTTLES DRAWN AEROBIC AND ANAEROBIC Blood Culture results may not be optimal due to an inadequate volume of blood received in culture bottles   Culture   Final    NO GROWTH 5 DAYS Performed at [facility], [address] [place], [place] [address]    Report Status 07/25/2023 FINAL  Final  SARS Coronavirus 2 by RT PCR (hospital order, performed in [facility] hospital lab) *cepheid single result test* Anterior Nasal Swab     Status: Abnormal   Collection Time: 07/21/23  8:45 AM   Specimen: Anterior Nasal Swab  Result Value Ref Range Status   SARS Coronavirus 2 by RT PCR POSITIVE (A) NEGATIVE Final    Comment: (NOTE) SARS-CoV-2 target nucleic acids are DETECTED  SARS-CoV-2 RNA is generally detectable in upper respiratory specimens  during the acute phase of infection.  Positive results are indicative  of the presence of the identified virus, but do not rule out bacterial infection or co-infection with other pathogens not detected by the test.  Clinical correlation with patient history and  other diagnostic information is necessary to determine patient infection status.  The expected result is negative.  Fact Sheet for Patients:   RoadLapTop.co.za   Fact Sheet for Healthcare Providers:   http://kim-miller.com/    This test is not yet approved or cleared by the [place]  FDA and  has been authorized for detection and/or diagnosis of  SARS-CoV-2 by FDA under an Emergency Use Authorization (EUA).  This EUA will remain in effect (meaning this test can be used) for the duration of  the COVID-19 declaration under Section 564(b)(1)  of the Act, 21 U.S.C. section 360-bbb-3(b)(1), unless the authorization is terminated or revoked sooner.   Performed at [facility], [address] [place], [place] [address]   Respiratory (~20 pathogens) panel by PCR     Status: None   Collection Time: 07/21/23  9:15  AM   Specimen: Nasopharyngeal Swab; Respiratory  Result Value Ref Range Status   Adenovirus NOT DETECTED NOT DETECTED Final   Coronavirus 229E NOT DETECTED NOT DETECTED Final    Comment: (NOTE) The Coronavirus on the Respiratory Panel, DOES NOT test for the novel  Coronavirus (2019 nCoV)    Coronavirus HKU1 NOT DETECTED NOT DETECTED Final   Coronavirus NL63 NOT DETECTED NOT DETECTED Final   Coronavirus OC43 NOT DETECTED NOT DETECTED Final   Metapneumovirus NOT DETECTED NOT DETECTED Final   Rhinovirus / Enterovirus NOT DETECTED NOT DETECTED Final   Influenza A NOT DETECTED NOT DETECTED Final   Influenza B NOT DETECTED NOT DETECTED Final   Parainfluenza Virus 1 NOT DETECTED NOT DETECTED Final   Parainfluenza Virus 2 NOT DETECTED NOT DETECTED Final   Parainfluenza Virus 3 NOT DETECTED NOT DETECTED Final   Parainfluenza Virus 4 NOT DETECTED NOT DETECTED Final   Respiratory Syncytial Virus NOT DETECTED NOT DETECTED Final   Bordetella pertussis NOT DETECTED NOT DETECTED Final   Bordetella Parapertussis NOT DETECTED NOT DETECTED Final   Chlamydophila pneumoniae NOT DETECTED NOT DETECTED Final   Mycoplasma pneumoniae NOT DETECTED NOT DETECTED Final    Comment: Performed at [facility], [address] [place], [place] [address]     Radiology Studies: DG Chest Port 1 View Result Date: 07/24/2023 CLINICAL DATA:  Dyspnea, cough. EXAM: PORTABLE CHEST 1 VIEW COMPARISON:  Jul 20, 2023. FINDINGS: Stable  cardiomediastinal silhouette. Right internal jugular Port-A-Cath is unchanged. Minimal bibasilar subsegmental atelectasis is noted. Bony thorax is unremarkable. IMPRESSION: Minimal bibasilar subsegmental atelectasis. Electronically Signed   By: Rosalene Colon M.D.   On: 07/24/2023 10:24     Scheduled Meds:  amiodarone   400 mg Oral BID   Followed by   [name] ON 07/30/2023] amiodarone   200 mg Oral Daily   apixaban   5 mg Oral BID   budesonide -glycopyrrolate -formoterol   2 puff Inhalation BID   Chlorhexidine  Gluconate Cloth  6 each Topical Daily   Ensifentrine   1 vial Inhalation BID   guaiFENesin   600 mg Oral BID   ipratropium  2 puff Inhalation Q6H   levalbuterol   1 puff Inhalation Q6H   methylPREDNISolone  (SOLU-MEDROL ) injection  40 mg Intravenous Q12H   roflumilast   500 mcg Oral BH-q7a   rOPINIRole   1 mg Oral QHS   sodium chloride  flush  10-40 mL Intracatheter Q12H   Continuous Infusions:     LOS: 5 days  MDM: Patient is high risk for one or more organ failure.  They necessitate ongoing hospitalization for continued IV therapies and subsequent lab monitoring. Total time spent interpreting labs and vitals, coordinating care amongst consultants and care team members, directly assessing and discussing care with the patient and/or family: 55 min  Allysia Ingles, DO [facility]  To contact the attending physician between 7A-7P please use Epic Chat. To contact the covering physician during after hours 7P-7A, please review Amion.  07/25/2023, 1:45 PM   *This document has been created with the assistance of dictation software. Please excuse typographical errors. *

## 2023-07-25 NOTE — Discharge Instructions (Signed)
 Transportation Resources for [employer]  Agency Name: [facility] Agency Address: [address] [place], [place] [address] Phone: [phone] Email: [email or web address] Website: [email or web address] Service(s) Offered: Housing services, self-sufficiency, congregate meal program, weatherization program, Field seismologist program, emergency food assistance,  housing counseling, home ownership program, wheels-towork program.  Agency Name: [facility] [employer] [phone]) Address: [address] [place], [place] [address] Phone: [phone] Website: [email or web address] Service(s) Offered: Transportation for [employer], subscription and demand response; Dial-a-Ride for citizens 4 years of age or older.  Agency Name: Department of Social Services Address: [address] [place], [place] [address] Phone: [phone] Service(s) Offered: Child support services; child welfare services; food stamps; Medicaid; work first family assistance; and aid with fuel,  rent, food and medicine, transportation assistance.  Agency Name: Disabled [employer] (DAV) Transportation  Network Phone: [phone] Service(s) Offered: Transports veterans to the [facility] medical center. Call  forty-eight hours in advance and leave the name, telephone  number, date, and time of appointment. Veteran will be  contacted by the driver the day before the appointment to  arrange a pick up point    [employer] ACTA currently provides door to door services. ACTA connects with PART daily for services to [facility]. ACTA also performs contract services to [employer] operates 27 vehicles, all but 3 mini-vans are equipped with lifts for special needs as well as the general public. ACTA drivers are each CDL certified and trained in First Aid and CPR. ACTA was established in 2002 by [employer]. An independent Industrial/product designer. ACTA operates via Cytogeneticist with required local 10% match funding from [employer]. ACTA provides over 80,000 passenger trips each year, including [facility] and [employer] sites.  Call at least by 11 AM one business day prior to needing transportation  [employer].                      [place], [place] [address]     Office Hours: Monday-Friday  8 AM - 5 PM    Some PCP options in [place] area- not a comprehensive list  [facility]- [phone] [facility]- [phone] [facility]- [phone] [facility]- [phone] [facility]- [phone] Kerney Pee- [phone]  or [facility] Physician Referral Line [phone]

## 2023-07-25 NOTE — Progress Notes (Signed)
 SATURATION QUALIFICATIONS: (This note is used to comply with regulatory documentation for home oxygen )  Patient Saturations on Room Air at Rest = 85%   Patient Saturations on 4 Liters of oxygen  at rest = 91%

## 2023-07-25 NOTE — Plan of Care (Signed)
  Problem: Education: Goal: Knowledge of General Education information will improve Description: Including pain rating scale, medication(s)/side effects and non-pharmacologic comfort measures Outcome: Not Progressing   Problem: Health Behavior/Discharge Planning: Goal: Ability to manage health-related needs will improve Outcome: Not Progressing   Problem: Clinical Measurements: Goal: Ability to maintain clinical measurements within normal limits will improve Outcome: Not Progressing Goal: Will remain free from infection Outcome: Not Progressing Goal: Diagnostic test results will improve Outcome: Not Progressing Goal: Respiratory complications will improve Outcome: Not Progressing Goal: Cardiovascular complication will be avoided Outcome: Not Progressing   Problem: Activity: Goal: Risk for activity intolerance will decrease Outcome: Not Progressing   Problem: Nutrition: Goal: Adequate nutrition will be maintained Outcome: Not Progressing   Problem: Coping: Goal: Level of anxiety will decrease Outcome: Not Progressing   Problem: Elimination: Goal: Will not experience complications related to bowel motility Outcome: Not Progressing Goal: Will not experience complications related to urinary retention Outcome: Not Progressing   Problem: Pain Managment: Goal: General experience of comfort will improve and/or be controlled Outcome: Not Progressing   Problem: Safety: Goal: Ability to remain free from injury will improve Outcome: Not Progressing   Problem: Skin Integrity: Goal: Risk for impaired skin integrity will decrease Outcome: Not Progressing   Problem: Education: Goal: Knowledge of risk factors and measures for prevention of condition will improve Outcome: Not Progressing   Problem: Coping: Goal: Psychosocial and spiritual needs will be supported Outcome: Not Progressing   Problem: Respiratory: Goal: Will maintain a patent airway Outcome: Not  Progressing Goal: Complications related to the disease process, condition or treatment will be avoided or minimized Outcome: Not Progressing   Problem: Education: Goal: Knowledge of disease or condition will improve Outcome: Not Progressing Goal: Knowledge of the prescribed therapeutic regimen will improve Outcome: Not Progressing Goal: Individualized Educational Video(s) Outcome: Not Progressing   Problem: Activity: Goal: Ability to tolerate increased activity will improve Outcome: Not Progressing Goal: Will verbalize the importance of balancing activity with adequate rest periods Outcome: Not Progressing   Problem: Respiratory: Goal: Ability to maintain a clear airway will improve Outcome: Not Progressing Goal: Levels of oxygenation will improve Outcome: Not Progressing Goal: Ability to maintain adequate ventilation will improve Outcome: Not Progressing

## 2023-07-25 NOTE — TOC Progression Note (Signed)
 Transition of Care [facility]) - Progression Note    Patient Details  Name: Brandi Fowler MRN: [id number] Date of Birth: [birth year]  [facility] CM/SW Contact  [place], [place] Phone Number: 07/25/2023, 12:38 PM  Clinical Narrative:     CSW made three attempts to contact pt to complete readmission screening. Pt is on airborne precautions and she is not answering the phone.        Expected Discharge Plan and Services                                               Social Determinants of Health (SDOH) Interventions SDOH Screenings   Food Insecurity: No Food Insecurity (07/21/2023)  Housing: Low Risk  (07/21/2023)  Transportation Needs: No Transportation Needs (07/21/2023)  Utilities: Not At Risk (07/21/2023)  Financial Resource Strain: Low Risk  (03/29/2023)   Received from [facility]  Physical Activity: Insufficiently Active (08/31/2018)  Social Connections: Moderately Isolated (07/21/2023)  Stress: Stress Concern Present (08/31/2018)  Tobacco Use: High Risk (07/21/2023)  Health Literacy: Low Risk  (06/14/2020)   Received from [facility], [facility]    Readmission Risk Interventions    07/25/2023   11:01 AM  Readmission Risk Prevention Plan  Transportation Screening Complete  PCP or Specialist Appt within 3-5 Days Complete

## 2023-07-25 NOTE — TOC Initial Note (Addendum)
 Transition of Care [facility]) - Initial/Assessment Note    Patient Details  Name: Brandi Fowler MRN: [id number] Date of Birth: September 26, 1965  Transition of Care [facility]) CM/SW Contact:    Odilia Bennett, LCSW Phone Number: 07/25/2023, 3:31 PM  Clinical Narrative:  Readmission prevention screen complete. Patient is on airborne isolation precautions. CSW called patient in the room, introduced role, and explained that discharge planning would be discussed. PCP is no longer [facility]. Patient reports transportation being a large barrier to getting to appointments, including her chemo and radiation. She goes to radiation daily and chemo on Thursdays. Patient reports trying to use numerous transportation agencies but she lives too far out. CSW added transportation resources to her AVS. Cancer center palliative NP added cancer center CSW to chat to see if there were any resources she could provide. CSW added PCP list to AVS. Pharmacy is [facility] in [place]. No issues obtaining medications. Patient lives home alone. No home health prior to admission. She has had it in the past but said she doesn't want people in her home. She has a wheelchair and oxygen  ([employer]) at home. She reports having a shower chair but she cannot use it in her tub. No further concerns. CSW will continue to follow patient for support and facilitate return home once stable. She is unsure who will take her home and said it will depend on the day.                3:45 pm: [employer] provides patient's oxygen  (2 L chronic), nebulizer, and CPAP. They are requesting updated sats and DME order at discharge.  4:27 pm: Per cancer center social worker, she and several other staff members have worked with patient regarding transportation and patient has chosen not to utilize the free resources that have been offered to her. Patient has also been approved for a grant called the [employer] to help offset the cost of transportation if  needed.  Expected Discharge Plan: Home/Self Care Barriers to Discharge: Continued Medical Work up   Patient Goals and CMS Choice            Expected Discharge Plan and Services     Post Acute Care Choice: NA Living arrangements for the past 2 months: Single Family Home                                      Prior Living Arrangements/Services Living arrangements for the past 2 months: Single Family Home Lives with:: Self Patient language and need for interpreter reviewed:: Yes Do you feel safe going back to the place where you live?: Yes      Need for Family Participation in Patient Care: Yes (Comment)   Current home services: DME Criminal Activity/Legal Involvement Pertinent to Current Situation/Hospitalization: No - Comment as needed  Activities of Daily Living   ADL Screening (condition at time of admission) Independently performs ADLs?: Yes (appropriate for developmental age) Is the patient deaf or have difficulty hearing?: No Does the patient have difficulty seeing, even when wearing glasses/contacts?: No Does the patient have difficulty concentrating, remembering, or making decisions?: No  Permission Sought/Granted                  Emotional Assessment   Attitude/Demeanor/Rapport: Engaged, Gracious Affect (typically observed): Accepting, Appropriate, Calm, Pleasant Orientation: : Oriented to  Time, Oriented to Self, Oriented to Place, Oriented to Situation  Alcohol / Substance Use: Not Applicable Psych Involvement: No (comment)  Admission diagnosis:  COPD exacerbation (HCC) [J44.1] Acute on chronic respiratory failure with hypoxia (HCC) [J96.21] Patient Active Problem List   Diagnosis Date Noted   Paroxysmal atrial fibrillation with RVR (HCC) 07/21/2023   Pulmonary hypertension (HCC) 07/21/2023   Restless leg syndrome 07/21/2023   COVID-19 07/21/2023   COPD exacerbation (HCC) 07/20/2023   Primary cancer of left upper lobe of lung (HCC)  04/24/2023   Lung cancer (HCC) 10/19/2022   Left leg swelling 05/08/2022   SOB (shortness of breath) 05/07/2022   Respiratory failure with hypoxia (HCC) 05/06/2022   Obstructive sleep apnea syndrome 01/10/2022   CAP (community acquired pneumonia) 08/30/2018   Seizure disorder (HCC) 08/30/2018   Schizophrenia (HCC) 08/30/2018   GERD (gastroesophageal reflux disease) 08/30/2018   Sepsis (HCC) 08/30/2018   Restless legs syndrome 06/15/2018   Chronic venous insufficiency 06/15/2018   Lymphedema 06/15/2018   Leg pain 02/09/2018   COPD with acute exacerbation (HCC) 02/09/2018   Nicotine  dependence, uncomplicated 03/02/2016   DDD (degenerative disc disease), thoracic 07/30/2014   DDD (degenerative disc disease), lumbar 07/30/2014   Thoracic facet syndrome 07/30/2014   Facet syndrome, lumbar 07/30/2014   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   Morbid obesity (HCC) 09/28/2011   Chronic pain 08/14/2006   PCP:  [facility] Pharmacy:   [id number] [place], [place] - [address] AT [facility] OF [address] [place] [address] [place] [address] Phone: [phone] Fax: [phone]     Social Drivers of Health (SDOH) Social History: SDOH Screenings   Food Insecurity: No Food Insecurity (07/21/2023)  Housing: Low Risk  (07/21/2023)  Transportation Needs: No Transportation Needs (07/21/2023)  Utilities: Not At Risk (07/21/2023)  Financial Resource Strain: Low Risk  (03/29/2023)   Received from [facility]  Physical Activity: Insufficiently Active (08/31/2018)  Social Connections: Moderately Isolated (07/21/2023)  Stress: Stress Concern Present (08/31/2018)  Tobacco Use: High Risk (07/21/2023)  Health Literacy: Low Risk  (06/14/2020)   Received from [facility], [facility]   SDOH Interventions:     Readmission Risk Interventions    07/25/2023    3:29 PM 07/25/2023   11:01 AM  Readmission Risk Prevention Plan  Transportation  Screening Complete Complete  PCP or Specialist Appt within 3-5 Days Complete Complete  Social Work Consult for Recovery Care Planning/Counseling Complete   Palliative Care Screening Not Applicable   Medication Review Oceanographer) Complete

## 2023-07-25 NOTE — Progress Notes (Signed)
 Rounding Note    Patient Name: Brandi Fowler Date of Encounter: 07/25/2023  [facility] Cardiologist: Brandi Boyden, MD   Subjective   Patient reports ongoing chest pain associated with deep breaths and coughing. Also ongoing shortness of breath worse since yesterday. Telemetry shows ~ 1 hours recurrence of atrial fibrillation yesterday afternoon. Now maintaining sinus rhythm.   Inpatient Medications    Scheduled Meds:  amiodarone   400 mg Oral BID   Followed by   Brandi Fowler ON 07/30/2023] amiodarone   200 mg Oral Daily   apixaban   5 mg Oral BID   budesonide -glycopyrrolate -formoterol   2 puff Inhalation BID   Chlorhexidine  Gluconate Cloth  6 each Topical Daily   Ensifentrine   1 vial Inhalation BID   guaiFENesin   600 mg Oral BID   ipratropium  2 puff Inhalation Q6H   levalbuterol   1 puff Inhalation Q6H   methylPREDNISolone  (SOLU-MEDROL ) injection  40 mg Intravenous Q12H   roflumilast   500 mcg Oral BH-q7a   rOPINIRole   1 mg Oral QHS   sodium chloride  flush  10-40 mL Intracatheter Q12H   Continuous Infusions:  PRN Meds: acetaminophen  **OR** acetaminophen , LORazepam , magnesium  hydroxide, morphine  injection, oxyCODONE , sodium chloride  flush, tiZANidine , traZODone    Vital Signs    Vitals:   07/24/23 2013 07/25/23 0023 07/25/23 0400 07/25/23 0845  BP: 106/73 103/73 111/77 99/72  Pulse: 85 98 91 77  Resp: 18 18 18 16   Temp: (!) 97.4 F (36.3 C) 98.4 F (36.9 C) 98.1 F (36.7 C) 98.1 F (36.7 C)  TempSrc: Oral Oral Axillary Oral  SpO2: 95% 94% 98% 95%  Weight:      Height:        Intake/Output Summary (Last 24 hours) at 07/25/2023 0920 Last data filed at 07/24/2023 2208 Gross per 24 hour  Intake 850 ml  Output --  Net 850 ml      07/20/2023    9:10 PM 07/14/2023    9:43 AM 07/14/2023    8:39 AM  Last 3 Weights  Weight (lbs) 276 lb 273 lb 276 lb 6.4 oz  Weight (kg) 125.193 kg 123.832 kg 125.374 kg      Telemetry    Sinus rhythm with one run of  afib/flutter yesterday afternoon 1600-1700 - Personally Reviewed  Physical Exam   GEN: No acute distress.   Neck: No JVD Cardiac: RRR, no murmurs, rubs, or gallops.  Respiratory: Unable to auscultate posteriorly secondary to pain. Diminished breath sounds anteriorly.  GI: Soft, nontender, non-distended  MS: No edema; No deformity. Neuro:  Nonfocal  Psych: Normal affect   Labs    High Sensitivity Troponin:   Recent Labs  Lab 07/14/23 1020 07/20/23 2113 07/21/23 0450 07/21/23 0632  TROPONINIHS 5 15 11 11      Chemistry Recent Labs  Lab 07/20/23 2113 07/21/23 0450 07/23/23 0457 07/24/23 0509 07/25/23 0320  NA 135   < > 136 140 138  K 3.9   < > 3.7 3.6 3.8  CL 103   < > 103 105 103  CO2 18*   < > 23 26 26   GLUCOSE 161*   < > 107* 114* 144*  BUN 27*   < > 16 21* 32*  CREATININE 1.18*   < > 0.61 0.68 0.94  CALCIUM 8.3*   < > 8.5* 8.6* 8.7*  PROT 6.6  --   --   --   --   ALBUMIN 3.0*  --   --   --   --  AST 30  --   --   --   --   ALT 39  --   --   --   --   ALKPHOS 67  --   --   --   --   BILITOT 0.8  --   --   --   --   GFRNONAA 53*   < > >60 >60 >60  ANIONGAP 14   < > 10 9 9    < > = values in this interval not displayed.    Lipids No results for input(s): "CHOL", "TRIG", "HDL", "LABVLDL", "LDLCALC", "CHOLHDL" in the last 168 hours.  Hematology Recent Labs  Lab 07/20/23 2113 07/21/23 0450 07/22/23 0550  WBC 4.7 3.2* 4.6  RBC 3.87 3.84* 3.49*  HGB 12.4 12.2 11.3*  HCT 36.5 38.2 32.9*  MCV 94.3 99.5 94.3  MCH 32.0 31.8 32.4  MCHC 34.0 31.9 34.3  RDW 15.5 15.6* 15.7*  PLT 97* 86* 87*   Thyroid  Recent Labs  Lab 07/21/23 0930  TSH 0.430    BNP Recent Labs  Lab 07/20/23 2113  BNP 382.4*    DDimer No results for input(s): "DDIMER" in the last 168 hours.   Radiology    DG Chest Port 1 View Result Date: 07/24/2023 IMPRESSION: Minimal bibasilar subsegmental atelectasis. Electronically Signed   By: Brandi Fowler M.D.   On: 07/24/2023 10:24    Cardiac Studies   TTE 07/21/2023  1. Left ventricular ejection fraction, by estimation, is 60 to 65%. The  left ventricle has normal function. The left ventricle has no regional  wall motion abnormalities. Left ventricular diastolic parameters are  indeterminate.   2. Right ventricular systolic function is normal. The right ventricular  size is normal. There is normal pulmonary artery systolic pressure. The  estimated right ventricular systolic pressure is 20.1 mmHg.   3. The mitral valve is normal in structure. No evidence of mitral valve  regurgitation. No evidence of mitral stenosis.   4. The aortic valve is normal in structure. Aortic valve regurgitation is  not visualized. No aortic stenosis is present.   5. The inferior vena cava is dilated in size with >50% respiratory  variability, suggesting right atrial pressure of 8 mmHg.   6. Rhythm is atrial fibrillation with RVR   Patient Profile     58 y.o. female with a history of COPD, lung cancer (on chemotherapy), scizophrenia, GERD, and OSA admitted 5/14 with acute COPD exacerbation and afib with RVR.   Assessment & Plan    Atrial fibrillation with RVR - Afib RVR in the setting of COPD exacerbation and COVID-19 infection - CHA2DS2-VASc Score = 2 [CHF History: 0, HTN History: 0, Diabetes History: 0, Stroke History: 0, Vascular Disease History: 1, Age Score: 0, Gender Score: 1]. - Recommend K > 4 and mag > 2 - Echo with normal LV function - Transitioned from IV to oral amiodarone  5/17 - Recurrence of afib RVR noted on telemetry 5/18 for ~1 hour, now maintaining sinus rhythm - Continue oral amiodarone , plan for 1 month of therapy to maintain sinus rhythm - Continue Eliquis  5 mg twice daily  Acute hypoxic respiratory failure Acute COPD exacerbation COVID-19 infection - Ongoing management per IM  Stage III lung cancer - On outpatient chemotherapy  Chest pain, noncardiac - Chronic pain with negative troponin and normal  EKG - Likely driven by respiratory illness - Continue to monitor - Repeat EKG for new or worsening pain  Elevated BNP - Appears  euvolemic on exam - Hold off on further diuresis  For questions or updates, please contact [facility] Please consult [email or web address] for contact info under        Signed, Brodie Cannon, PA-C  07/25/2023, 9:20 AM

## 2023-07-26 ENCOUNTER — Ambulatory Visit

## 2023-07-26 DIAGNOSIS — J441 Chronic obstructive pulmonary disease with (acute) exacerbation: Secondary | ICD-10-CM | POA: Diagnosis not present

## 2023-07-26 DIAGNOSIS — I480 Paroxysmal atrial fibrillation: Secondary | ICD-10-CM | POA: Diagnosis not present

## 2023-07-26 LAB — BASIC METABOLIC PANEL WITH GFR
Anion gap: 8 (ref 5–15)
BUN: 27 mg/dL — ABNORMAL HIGH (ref 6–20)
CO2: 28 mmol/L (ref 22–32)
Calcium: 8.7 mg/dL — ABNORMAL LOW (ref 8.9–10.3)
Chloride: 105 mmol/L (ref 98–111)
Creatinine, Ser: 0.66 mg/dL (ref 0.44–1.00)
GFR, Estimated: >60 mL/min (ref >60)
Glucose, Bld: 95 mg/dL (ref 70–99)
Potassium: 3.7 mmol/L (ref 3.5–5.1)
Sodium: 141 mmol/L (ref 135–145)

## 2023-07-26 MED ORDER — TIZANIDINE HCL 4 MG PO TABS
4.0000 mg | ORAL_TABLET | Freq: Four times a day (QID) | ORAL | Status: DC
  Administered 2023-07-26 – 2023-07-27 (×5): 4 mg via ORAL
  Filled 2023-07-26 (×6): qty 1

## 2023-07-26 MED ORDER — OXYCODONE HCL 5 MG PO TABS
15.0000 mg | ORAL_TABLET | Freq: Four times a day (QID) | ORAL | Status: DC
  Administered 2023-07-26 – 2023-07-27 (×4): 15 mg via ORAL
  Filled 2023-07-26 (×4): qty 3

## 2023-07-26 NOTE — Progress Notes (Signed)
 Pt got upset after setting expectations in regards to pain meds. Told her I would do my best to be here on time but she did not like it and started yelling at me.   Education given to pt adv her that she would not be speaking to me the way she was and she wanted a new nurse.   Notified charge.

## 2023-07-26 NOTE — Progress Notes (Signed)
 PROGRESS NOTE    Brandi Fowler  NWG:[id number] DOB: 03/28/1965 DOA: 07/20/2023 PCP: [facility]  Chief Complaint  Patient presents with   Shortness of Breath    Hospital Course:  Brandi Fowler is a 58 year old female with COPD, lung cancer, schizophrenia, GERD, urolithiasis, OSA, seizure disorder, PVD, PTSD, depression, degenerative disc disease, presents to the ED with acute onset dyspnea and cough.  Arrival to the ED she was found to be in A-fib RVR 200s, she was cardioverted by EMS and returned to sinus rhythm.  In the ED she received 2 g mag sulfate, DuoNebs, albuterol . Patient is found to have COVID-19, and is in acute hypoxic respiratory failure.  She is required CPAP therapy for most hours of the day for her own comfort.  She does maintain O2 sats above 92% with nasal cannula but is significantly dyspneic with work of breathing when doing so.  Subjective: This Fowler patient is tearful.  She reports difficulty with pain again overnight.  She also reports overall feeling tired.  She reports she wants to focus on her comfort at this time.  We discussed hospice and what that would look like.  She reports that she is very familiar with hospice as her mother and spouse have both passed away on hospice due to lung cancer.    She reports that she knew hospice was on the horizon.  She understands that she is getting closer to that time now.  She reports she would like some time to discuss things with her family before making a decision.  I also spoke directly to her daughter, Lynnie Saucier, on the phone and discussed my conversation with Brandi Fowler.  Lynnie Saucier endorses understanding and reports they will continue to discuss as a family.  Objective: Vitals:   07/25/23 2347 07/26/23 0317 07/26/23 0745 07/26/23 1255  BP: (!) 156/86 117/69 (!) 154/104 123/77  Pulse: 98 81 82 93  Resp: (!) 22 (!) 24 18 17   Temp: (!) 97.5 F (36.4 C) 98.1 F (36.7 C) 98.1 F (36.7 C)  98.2 F (36.8 C)  TempSrc: Oral Oral    SpO2: 95% 98% 98% 90%  Weight:      Height:        Intake/Output Summary (Last 24 hours) at 07/26/2023 1605 Last data filed at 07/25/2023 2256 Gross per 24 hour  Intake 10 ml  Output --  Net 10 ml   Filed Weights   07/20/23 2110  Weight: 125.2 kg    Examination: General exam: Tearful, eating chicken wings on arrival. respiratory system: Better aeration bilaterally, end expiratory wheezing, pursed lip breathing, dyspnea with speaking cardiovascular system: Rapid rate, regular rhythm Gastrointestinal system: obese abdomen Neuro: Alert and oriented. No focal neurological deficits. Psychiatry:. Mood & affect appropriate for situation.   Assessment & Plan:  Principal Problem:   COPD exacerbation (HCC) Active Problems:   Paroxysmal atrial fibrillation with RVR (HCC)   Seizure disorder (HCC)   Schizophrenia (HCC)   Morbid obesity (HCC)   Obstructive sleep apnea syndrome   Chronic pain   Lung cancer (HCC)   Pulmonary hypertension (HCC)   Restless leg syndrome   COVID-19   A-fib RVR, new - Initial rate 200s - Cardioverted with EMS, reportedly converted to sinus rhythm.  Then back to RVR - RVR likely triggered by respiratory distress and COVID-19 - Cardiology has been consulted - Initially on Amio drip, now on p.o. Amio - Cardiology recommends 1 month course of amiodarone  while respiratory  illness resolves - Currently 400 mg x 7 days followed by 200 daily x 21 days then stop. - Continues to have some breakthrough tachycardia, mostly sinus, occasionally A-fib.  Avoid beta-blockers if able due to concerns of bronchospasm - Continue close monitoring on telemetry - Cardiology reports she is not a candidate for A-fib ablation due to lung disease and lung cancer - Currently on Eliquis , will continue - Echocardiogram: EF 60 to 65%, no RWMA, diastolic parameters indeterminate.  No noted valvular disease -TSH within normal limits  COPD with  exacerbation COVID-19 Acute on chronic hypoxic respiratory failure -COPD appears to be at end-stage. -Chronically on 2 L nasal cannula, maintains O2 sats above 92% on nasal cannula but is very dyspneic and tachypneic and has work of breathing with minimal exertion - Currently she is using her home CPAP which is providing significant relief.  Requiring CPAP the majority of the day - Repeat chest x-ray without acute changes - No evidence of pneumonia, no indication for antibiotics - Continue with current dose of steroids - Continue with ipratropium only given RVR.  Xopenex  as needed - Continue home Daliresp  and hold Trelegy for now - Patient is on day 6 of COVID now.  Would anticipate improvement in the coming days  Poor prognosis - In the setting of chronic respiratory failure, end-stage COPD, recurrence of stage III lung cancer, chronic pain, and now acute infection with COVID-19 patient's prognosis is overall poor.  She is able to maintain O2 sats with nasal cannula but spends the majority of her day requiring CPAP due to profound work of breathing and desaturation when on nasal cannula alone. Upon further discussion she is now considering hospice.  We will consult palliative care.  Appreciate their assistance and further GOC conversations.  Substernal chest pain - Intermittent, chronic. - Unclear etiology. - Cardiac testing has been negative. - CTA PE and CT dissection negative - Pulmonology was consulted and believes this may be secondary to mass effect or radiation from malignancy. - Pain has been very responsive to combination of oxycodone  and Zanaflex .  These have been scheduled now.  As needed morphine  remains available  Stage III SCC lung cancer - This is recurrence.  Initially stage I status post radiation.  Has now recurred and she is on chemotherapy. - Hold chemo while admitted. - Needs close outpatient follow-up with oncology - CT this admission shows enlarged precarinal lymph  node 2.0 x 2.5 cm, slightly decreased in the prior studies.  Compatible with metastasis.  Multiple additional mildly prominent mediastinal and bilateral hilar lymph nodes.  No new suspicious lung nodules.  Stable 4 x 6 mm noncalcified nodule in right upper lobe.  Morbid obesity BMI 45 -Complicating the above.  Certainly some component of obesity hypoventilation syndrome -- Outpatient follow up for lifestyle modification and risk factor management  Pancytopenia Leukopenia Thrombocytopenia - Leukopenia has resolved to normal.  Suspect secondary to chemo which is currently on hold - Continue to follow CBC  Chronic pain -Patient endorses that she has had difficulty following up with outpatient pain management.  She reports her doctors are located in multiple different locations and that she has been missing some appointments due to difficulty with transportation - Will refer to [facility] at discharge if so desired. - Will likely need a bridging Rx of oxycodone  to get to this appointment  Restless leg syndrome - Resume home meds  OSA - CPAP at night  PTSD Depression Schizophrenia - Patient has been alert and  oriented x 4 for all of our discussions.  She maintains capacity to make decisions.  No evidence of auditory or visual hallucinations during my evaluations - Resume all home meds  Tobacco abuse - Patient reports that she has been smoking since she is 58 years old.  She is down to 3 cigarettes a day.  Still endorses desire for cigarettes even while hospitalized.  Reports she has failed multiple prior attempts to quit.  She is grateful for opportunity of complete abstinence while admitted  DVT prophylaxis: Eliquis    Code Status: Full Code Disposition: Inpatient.  Respiratory status remains tenuous.  Continue close monitoring and medication titration as needed. Palliative care has been consulted.  We will continue with daily discussions to determine patient  goals Consultants:  Treatment Team:  Consulting Physician: Sheryle Donning, MD  Procedures:    Antimicrobials:  Anti-infectives (From admission, onward)    Start     Dose/Rate Route Frequency Ordered Stop   07/20/23 2345  cefTRIAXone  (ROCEPHIN ) 1 g in sodium chloride  0.9 % 100 mL IVPB  Status:  Discontinued        1 g 200 mL/hr over 30 Minutes Intravenous Daily at bedtime 07/20/23 2329 07/21/23 1154       Data Reviewed: I have personally reviewed following labs and imaging studies CBC: Recent Labs  Lab 07/20/23 2113 07/21/23 0450 07/22/23 0550  WBC 4.7 3.2* 4.6  HGB 12.4 12.2 11.3*  HCT 36.5 38.2 32.9*  MCV 94.3 99.5 94.3  PLT 97* 86* 87*   Basic Metabolic Panel: Recent Labs  Lab 07/22/23 0550 07/23/23 0457 07/24/23 0509 07/25/23 0320 07/26/23 0430  NA 139 136 140 138 141  K 3.8 3.7 3.6 3.8 3.7  CL 107 103 105 103 105  CO2 22 23 26 26 28   GLUCOSE 104* 107* 114* 144* 95  BUN 17 16 21* 32* 27*  CREATININE 0.68 0.61 0.68 0.94 0.66  CALCIUM 8.3* 8.5* 8.6* 8.7* 8.7*  MG  --   --   --  1.9  --    GFR: Estimated Creatinine Clearance: 99.5 mL/min (by C-G formula based on SCr of 0.66 mg/dL). Liver Function Tests: Recent Labs  Lab 07/20/23 2113  AST 30  ALT 39  ALKPHOS 67  BILITOT 0.8  PROT 6.6  ALBUMIN 3.0*   CBG: No results for input(s): "GLUCAP" in the last 168 hours.  Recent Results (from the past 240 hours)  Blood culture (routine x 2)     Status: None   Collection Time: 07/20/23  9:13 PM   Specimen: BLOOD  Result Value Ref Range Status   Specimen Description BLOOD BLOOD LEFT ARM  Final   Special Requests   Final    BOTTLES DRAWN AEROBIC AND ANAEROBIC Blood Culture results may not be optimal due to an inadequate volume of blood received in culture bottles   Culture   Final    NO GROWTH 5 DAYS Performed at [facility], [address] [place], [place] [address]    Report Status 07/25/2023 FINAL  Final  Blood culture (routine x  2)     Status: None   Collection Time: 07/20/23  9:23 PM   Specimen: BLOOD  Result Value Ref Range Status   Specimen Description BLOOD BLOOD RIGHT ARM  Final   Special Requests   Final    BOTTLES DRAWN AEROBIC AND ANAEROBIC Blood Culture results may not be optimal due to an inadequate volume of blood received in culture bottles   Culture  Final    NO GROWTH 5 DAYS Performed at [facility], [address] [place], [place] [address]    Report Status 07/25/2023 FINAL  Final  SARS Coronavirus 2 by RT PCR (hospital order, performed in [facility] hospital lab) *cepheid single result test* Anterior Nasal Swab     Status: Abnormal   Collection Time: 07/21/23  8:45 AM   Specimen: Anterior Nasal Swab  Result Value Ref Range Status   SARS Coronavirus 2 by RT PCR POSITIVE (A) NEGATIVE Final    Comment: (NOTE) SARS-CoV-2 target nucleic acids are DETECTED  SARS-CoV-2 RNA is generally detectable in upper respiratory specimens  during the acute phase of infection.  Positive results are indicative  of the presence of the identified virus, but do not rule out bacterial infection or co-infection with other pathogens not detected by the test.  Clinical correlation with patient history and  other diagnostic information is necessary to determine patient infection status.  The expected result is negative.  Fact Sheet for Patients:   RoadLapTop.co.za   Fact Sheet for Healthcare Providers:   http://kim-miller.com/    This test is not yet approved or cleared by the [place]  FDA and  has been authorized for detection and/or diagnosis of SARS-CoV-2 by FDA under an Emergency Use Authorization (EUA).  This EUA will remain in effect (meaning this test can be used) for the duration of  the COVID-19 declaration under Section 564(b)(1)  of the Act, 21 U.S.C. section 360-bbb-3(b)(1), unless the authorization is terminated or revoked  sooner.   Performed at [facility], [address] [place], [place] [address]   Respiratory (~20 pathogens) panel by PCR     Status: None   Collection Time: 07/21/23  9:15 AM   Specimen: Nasopharyngeal Swab; Respiratory  Result Value Ref Range Status   Adenovirus NOT DETECTED NOT DETECTED Final   Coronavirus 229E NOT DETECTED NOT DETECTED Final    Comment: (NOTE) The Coronavirus on the Respiratory Panel, DOES NOT test for the novel  Coronavirus (2019 nCoV)    Coronavirus HKU1 NOT DETECTED NOT DETECTED Final   Coronavirus NL63 NOT DETECTED NOT DETECTED Final   Coronavirus OC43 NOT DETECTED NOT DETECTED Final   Metapneumovirus NOT DETECTED NOT DETECTED Final   Rhinovirus / Enterovirus NOT DETECTED NOT DETECTED Final   Influenza A NOT DETECTED NOT DETECTED Final   Influenza B NOT DETECTED NOT DETECTED Final   Parainfluenza Virus 1 NOT DETECTED NOT DETECTED Final   Parainfluenza Virus 2 NOT DETECTED NOT DETECTED Final   Parainfluenza Virus 3 NOT DETECTED NOT DETECTED Final   Parainfluenza Virus 4 NOT DETECTED NOT DETECTED Final   Respiratory Syncytial Virus NOT DETECTED NOT DETECTED Final   Bordetella pertussis NOT DETECTED NOT DETECTED Final   Bordetella Parapertussis NOT DETECTED NOT DETECTED Final   Chlamydophila pneumoniae NOT DETECTED NOT DETECTED Final   Mycoplasma pneumoniae NOT DETECTED NOT DETECTED Final    Comment: Performed at [facility], [address] [place], [place] [address]     Radiology Studies: No results found.    Scheduled Meds:  amiodarone   400 mg Oral BID   Followed by   [name] ON 07/30/2023] amiodarone   200 mg Oral Daily   apixaban   5 mg Oral BID   budesonide -glycopyrrolate -formoterol   2 puff Inhalation BID   Chlorhexidine  Gluconate Cloth  6 each Topical Daily   Ensifentrine   1 vial Inhalation BID   guaiFENesin   600 mg Oral BID   ipratropium  2 puff Inhalation Q6H   levalbuterol   1 puff Inhalation Q6H   methylPREDNISolone   (SOLU-MEDROL ) injection  40 mg Intravenous Q12H   roflumilast   500 mcg Oral BH-q7a   rOPINIRole   1 mg Oral QHS   sodium chloride  flush  10-40 mL Intracatheter Q12H   Continuous Infusions:     LOS: 6 days  MDM: Patient is high risk for one or more organ failure.  They necessitate ongoing hospitalization for continued IV therapies and subsequent lab monitoring. Total time spent interpreting labs and vitals, coordinating care amongst consultants and care team members, directly assessing and discussing care with the patient and/or family: 55 min  Samora Jernberg, DO [facility]  To contact the attending physician between 7A-7P please use Epic Chat. To contact the covering physician during after hours 7P-7A, please review Amion.  07/26/2023, 4:05 PM   *This document has been created with the assistance of dictation software. Please excuse typographical errors. *

## 2023-07-26 NOTE — Progress Notes (Signed)
 Spoke to patients daughter Lynnie Saucier. Is concerned that her mom may be in a "schizophrenic episode" and is not understanding her care. Messaged Dr Marquette Sites to possibly call the daughter to get everyone on the same page.

## 2023-07-26 NOTE — TOC Progression Note (Signed)
 Transition of Care [facility]) - Progression Note    Patient Details  Name: Brandi Fowler MRN: [id number] Date of Birth: 01/02/1966  Transition of Care [facility]) CM/SW Contact  Odilia Bennett, LCSW Phone Number: 07/26/2023, 11:42 AM  Clinical Narrative: CSW faxed sats note to [facility].    Expected Discharge Plan: Home/Self Care Barriers to Discharge: Continued Medical Work up  Expected Discharge Plan and Services     Post Acute Care Choice: NA Living arrangements for the past 2 months: Single Family Home                                       Social Determinants of Health (SDOH) Interventions SDOH Screenings   Food Insecurity: No Food Insecurity (07/21/2023)  Housing: Low Risk  (07/21/2023)  Transportation Needs: No Transportation Needs (07/21/2023)  Utilities: Not At Risk (07/21/2023)  Financial Resource Strain: Low Risk  (03/29/2023)   Received from [facility]  Physical Activity: Insufficiently Active (08/31/2018)  Social Connections: Moderately Isolated (07/21/2023)  Stress: Stress Concern Present (08/31/2018)  Tobacco Use: High Risk (07/21/2023)  Health Literacy: Low Risk  (06/14/2020)   Received from [facility], [facility]    Readmission Risk Interventions    07/25/2023    3:29 PM 07/25/2023   11:01 AM  Readmission Risk Prevention Plan  Transportation Screening Complete Complete  PCP or Specialist Appt within 3-5 Days Complete Complete  Social Work Consult for Recovery Care Planning/Counseling Complete   Palliative Care Screening Not Applicable   Medication Review [employer]) Complete

## 2023-07-26 NOTE — Progress Notes (Signed)
 Initial visit pt had staff in room- upon rounding back pt appeared to be resting and did not respond to my knock on door. Let nurse know I am available this evening however I can also ensure someone will be by in the morning-which ever worked better for Brandi Fowler

## 2023-07-27 ENCOUNTER — Ambulatory Visit

## 2023-07-27 ENCOUNTER — Other Ambulatory Visit: Payer: Self-pay

## 2023-07-27 ENCOUNTER — Encounter: Payer: Self-pay | Admitting: Internal Medicine

## 2023-07-27 ENCOUNTER — Other Ambulatory Visit: Payer: Self-pay | Admitting: Internal Medicine

## 2023-07-27 DIAGNOSIS — Z515 Encounter for palliative care: Secondary | ICD-10-CM | POA: Diagnosis not present

## 2023-07-27 DIAGNOSIS — R000 Tachycardia, unspecified: Secondary | ICD-10-CM | POA: Diagnosis not present

## 2023-07-27 DIAGNOSIS — C3412 Malignant neoplasm of upper lobe, left bronchus or lung: Secondary | ICD-10-CM

## 2023-07-27 DIAGNOSIS — J441 Chronic obstructive pulmonary disease with (acute) exacerbation: Secondary | ICD-10-CM | POA: Diagnosis not present

## 2023-07-27 MED ORDER — APIXABAN 5 MG PO TABS
5.0000 mg | ORAL_TABLET | Freq: Two times a day (BID) | ORAL | 0 refills | Status: AC
  Filled 2023-07-27: qty 60, 30d supply, fill #0

## 2023-07-27 MED ORDER — TIZANIDINE HCL 4 MG PO TABS
4.0000 mg | ORAL_TABLET | Freq: Four times a day (QID) | ORAL | 0 refills | Status: AC | PRN
  Filled 2023-07-27: qty 30, 8d supply, fill #0

## 2023-07-27 MED ORDER — HEPARIN SOD (PORK) LOCK FLUSH 100 UNIT/ML IV SOLN
500.0000 [IU] | Freq: Once | INTRAVENOUS | Status: AC
  Administered 2023-07-27: 500 [IU] via INTRAVENOUS
  Filled 2023-07-27 (×2): qty 5

## 2023-07-27 MED ORDER — GUAIFENESIN ER 600 MG PO TB12
600.0000 mg | ORAL_TABLET | Freq: Two times a day (BID) | ORAL | 0 refills | Status: AC
  Filled 2023-07-27: qty 60, 30d supply, fill #0

## 2023-07-27 MED ORDER — ACETAMINOPHEN 325 MG PO TABS: 650.0000 mg | ORAL_TABLET | Freq: Four times a day (QID) | ORAL | Status: AC | PRN

## 2023-07-27 MED ORDER — LEVALBUTEROL TARTRATE 45 MCG/ACT IN AERO
1.0000 | INHALATION_SPRAY | Freq: Four times a day (QID) | RESPIRATORY_TRACT | 12 refills | Status: AC
  Filled 2023-07-27: qty 15, 30d supply, fill #0

## 2023-07-27 MED ORDER — IPRATROPIUM BROMIDE HFA 17 MCG/ACT IN AERS
2.0000 | INHALATION_SPRAY | Freq: Four times a day (QID) | RESPIRATORY_TRACT | 12 refills | Status: AC
  Filled 2023-07-27: qty 12.9, 30d supply, fill #0

## 2023-07-27 MED ORDER — AMIODARONE HCL 200 MG PO TABS
ORAL_TABLET | ORAL | 0 refills | Status: AC
  Filled 2023-07-27: qty 33, 24d supply, fill #0

## 2023-07-27 MED ORDER — OXYCODONE HCL 15 MG PO TABS
15.0000 mg | ORAL_TABLET | Freq: Four times a day (QID) | ORAL | 0 refills | Status: DC
  Filled 2023-07-27: qty 20, 5d supply, fill #0

## 2023-07-27 NOTE — Progress Notes (Signed)
LABS ADDED

## 2023-07-27 NOTE — Consult Note (Signed)
 Palliative Medicine [facility] [phone] [phone]   Name: Brandi Fowler Date: 07/27/2023 MRN: [id number]  DOB: [birth year]  Patient Care Team: Inc, [facility] as PCP - General Jerelene Monday, Deadra Everts, MD as PCP - Cardiology (Cardiology) Drake Gens, RN as Oncology Nurse Navigator Gwyn Leos, MD as Consulting Physician (Oncology)    REASON FOR CONSULTATION: Brandi Fowler is a 58 y.o. female with multiple medical problems including O2 dependent COPD, tobacco use, at least stage III squamous cell lung.  Plan is for concurrent chemoradiation.  She was admitted to hospital on 07/20/2023 with COPD exacerbation.  Also found to have COVID infection.  Hospitalization further complicated by A-fib with RVR requiring cardioversion.  Patient referred to palliative care to address goals. .   SOCIAL HISTORY:     reports that she has been smoking cigarettes. She has a 11.3 pack-year smoking history. She has quit using smokeless tobacco. She reports that she does not currently use alcohol after a past usage of about 1.0 standard drink of alcohol per week. She reports that she does not use drugs.  Patient is not married and lives at home alone.  However, she has a "fianc"/significant other whom she has been with for decades.  Patient also has a son and 2 daughters.  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  ADVANCE DIRECTIVES:  Does not have  CODE STATUS: DNR  PAST MEDICAL HISTORY: Past Medical History:  Diagnosis Date   BRBPR (bright red blood per rectum)    CAP (community acquired pneumonia)    Chronic diarrhea    Chronic kidney disease    Chronic myofascial pain    Chronic pain    Chronic venous insufficiency    Cigarette smoker    COPD (chronic obstructive pulmonary disease) (HCC)    Coronary artery disease    DDD (degenerative disc disease),  cervical    DDD (degenerative disc disease), thoracic    Depression    Elevated blood pressure reading without diagnosis of hypertension    Elevated hemoglobin A1c    Female hirsutism    GERD (gastroesophageal reflux disease)    Headache    migraines   History of adenomatous polyp of colon    History of kidney stones    History of sepsis    Kidney stones    Lung cancer (HCC)    Lymphedema    Mediastinal adenopathy    Morbid obesity (HCC)    Obesity, Class III, BMI 40-49.9 (morbid obesity)    OSA (obstructive sleep apnea)    Paranoid schizophrenia (HCC)    Peripheral vascular disease (HCC)    Primary cancer of left upper lobe of lung (HCC)    PTSD (post-traumatic stress disorder)    RLS (restless legs syndrome)    Seizures (HCC)    last seizure in Jan 2025   Sleep apnea    Supplemental oxygen  dependent    2-3 L [place]   Syncope     PAST SURGICAL HISTORY:  Past Surgical History:  Procedure Laterality Date   CERVICAL SPINE SURGERY     Fusion C6-7   CHOLECYSTECTOMY     COLONOSCOPY WITH PROPOFOL  N/A 11/18/2014   Procedure: COLONOSCOPY WITH PROPOFOL ;  Surgeon: Cassie Click, MD;  Location: [facility] ENDOSCOPY;  Service: Endoscopy;  Laterality: N/A;   ESOPHAGOGASTRODUODENOSCOPY     FLEXIBLE BRONCHOSCOPY N/A 05/13/2023   Procedure: BRONCHOSCOPY, FLEXIBLE;  Surgeon:  Erskin Hearing, MD;  Location: [facility] ORS;  Service: Thoracic;  Laterality: N/A;   IR IMAGING GUIDED PORT INSERTION  06/03/2023   KNEE SURGERY Left    polpectomy N/A    TUBAL LIGATION     VIDEO BRONCHOSCOPY WITH ENDOBRONCHIAL ULTRASOUND N/A 05/13/2023   Procedure: BRONCHOSCOPY, WITH EBUS;  Surgeon: Erskin Hearing, MD;  Location: [facility] ORS;  Service: Thoracic;  Laterality: N/A;    HEMATOLOGY/ONCOLOGY HISTORY:  Oncology History Overview Note  # MAY 2024- RUL presumed  [poor candidate for biopsy at the time] stage I NCSLC- s/p SBRT.     # PET scan-January/February 2025- the treated right upper lobe nodule demonstrates no  significant residual update. However new enlarging mediastinal and right hilar lymph nodes demonstrated on recent CT are hypermetabolic, consistent with metastatic disease.  No evidence of distant metastatic disease. MARCH 2025- SQUAMOUS CELL CA of LN Bx- [Dr.Aleskerov]  # April 11th, 2025- Carbo-Taxol - RT   # Chronic Respiratory failure/COPD- on 2 lits home O2.     Primary cancer of left upper lobe of lung (HCC)  04/24/2023 Initial Diagnosis   Primary cancer of left upper lobe of lung (HCC)   04/24/2023 Cancer Staging   Staging form: Lung, AJCC V9 - Pathologic: Stage IIIA (pT1c, pN2b, cM0) - Signed by Gwyn Leos, MD on 05/24/2023   06/17/2023 -  Chemotherapy   Patient is on Treatment Plan : LUNG Carboplatin  + Paclitaxel  + XRT q7d       ALLERGIES:  is allergic to aripiprazole, baclofen, buprenorphine-naloxone , clonazepam, cyclobenzaprine, doxepin, fentanyl , lurasidone, methadone, oxymorphone, penicillins, pregabalin , quetiapine, tolmetin, trazodone , acetaminophen , bupropion, colestipol, gabapentin, hydrocodone, paroxetine, sertraline, amitriptyline hcl, carbamazepine, ciprofloxacin hcl, codeine, divalproex  sodium, etodolac, haloperidol , hyaluronate sodium, hydrochlorothiazide, ibuprofen, indocin [indomethacin], ketoprofen, latuda [lurasidone hcl], lidocaine , meloxicam, metformin and related, methocarbamol , nabumetone, naproxen, ondansetron , opana [oxymorphone hcl], oxaprozin, perphenazine , risperidone  and related, seroquel [quetiapine fumarate], silver, suboxone [buprenorphine hcl-naloxone  hcl], tramadol, valproic acid, zoloft [sertraline hcl], bromfenac, buprenorphine, ciprofloxacin, clindamycin, clindamycin/lincomycin, flurbiprofen, metronidazole, mirtazapine, and other.  MEDICATIONS:  Current Facility-Administered Medications  Medication Dose Route Frequency Provider Last Rate Last Admin   acetaminophen  (TYLENOL ) tablet 650 mg  650 mg Oral Q6H PRN Patel, Kishan S, RPH       Or    acetaminophen  (TYLENOL ) suppository 650 mg  650 mg Rectal Q6H PRN Patel, Kishan S, RPH       amiodarone  (PACERONE ) tablet 400 mg  400 mg Oral BID Harrold Lincoln, MD   400 mg at 07/27/23 1610   Followed by   [name] ON 07/30/2023] amiodarone  (PACERONE ) tablet 200 mg  200 mg Oral Daily O'Neal, Cathay Clonts, MD       apixaban  (ELIQUIS ) tablet 5 mg  5 mg Oral BID Ronald Cockayne, NP   5 mg at 07/27/23 9604   budesonide -glycopyrrolate -formoterol  (BREZTRI ) 160-9-4.8 MCG/ACT inhaler 2 puff  2 puff Inhalation BID Elisabeth Guild, NP   2 puff at 07/27/23 5409   Chlorhexidine  Gluconate Cloth 2 % PADS 6 each  6 each Topical Daily Janeane Mealy, MD   6 each at 07/27/23 0919   diphenhydrAMINE  (BENADRYL ) capsule 25 mg  25 mg Oral Q6H PRN Mansy, Jan A, MD   25 mg at 07/27/23 1206   Ensifentrine  SUSP 1 vial  1 vial Inhalation BID Elisabeth Guild, NP   1 vial at 07/27/23 1126   guaiFENesin  (MUCINEX ) 12 hr tablet 600 mg  600 mg Oral BID Mansy, Jan A, MD   600 mg at 07/27/23 0919   ipratropium (ATROVENT   HFA) inhaler 2 puff  2 puff Inhalation Q6H Merrill, Kristin A, RPH   2 puff at 07/27/23 1451   levalbuterol  (XOPENEX  HFA) inhaler 1 puff  1 puff Inhalation Q6H Dezii, Alexandra, DO   1 puff at 07/27/23 1451   LORazepam  (ATIVAN ) tablet 1 mg  1 mg Oral BID PRN Dezii, Alexandra, DO       magnesium  hydroxide (MILK OF MAGNESIA) suspension 30 mL  30 mL Oral Daily PRN Mansy, Jan A, MD       methylPREDNISolone  sodium succinate (SOLU-MEDROL ) 40 mg/mL injection 40 mg  40 mg Intravenous Q12H Dezii, Alexandra, DO   40 mg at 07/27/23 0640   morphine  (PF) 2 MG/ML injection 2 mg  2 mg Intravenous Q2H PRN Mansy, Jan A, MD   2 mg at 07/27/23 1455   oxyCODONE  (Oxy IR/ROXICODONE ) immediate release tablet 15 mg  15 mg Oral Q6H Dezii, Alexandra, DO   15 mg at 07/27/23 1451   roflumilast  (DALIRESP ) tablet 500 mcg  500 mcg Oral Ricke Charleston, Cornelius Dill, NP   500 mcg at 07/27/23 0918   rOPINIRole  (REQUIP ) tablet 1 mg  1 mg Oral  QHS Mansy, Jan A, MD   1 mg at 07/26/23 2150   sodium chloride  flush (NS) 0.9 % injection 10-40 mL  10-40 mL Intracatheter Q12H Wouk, Haynes Lips, MD   10 mL at 07/27/23 0920   sodium chloride  flush (NS) 0.9 % injection 10-40 mL  10-40 mL Intracatheter PRN Wouk, Haynes Lips, MD       tiZANidine  (ZANAFLEX ) tablet 4 mg  4 mg Oral Q6H Dezii, Alexandra, DO   4 mg at 07/27/23 1127   traZODone  (DESYREL ) tablet 25 mg  25 mg Oral QHS PRN Mansy, Jan A, MD   25 mg at 07/21/23 2144    VITAL SIGNS: BP (!) 162/97 (BP Location: Right Wrist)   Pulse 91   Temp 97.9 F (36.6 C) (Oral)   Resp 20   Ht 5\' 5"  (1.651 m)   Wt 276 lb (125.2 kg)   LMP 08/13/2014 (LMP Unknown) Comment: tubal ligation  SpO2 98%   BMI 45.93 kg/m  Filed Weights   07/20/23 2110  Weight: 276 lb (125.2 kg)    Estimated body mass index is 45.93 kg/m as calculated from the following:   Height as of this encounter: 5\' 5"  (1.651 m).   Weight as of this encounter: 276 lb (125.2 kg).  LABS: CBC:    Component Value Date/Time   WBC 4.6 07/22/2023 0550   HGB 11.3 (L) 07/22/2023 0550   HGB 11.4 (L) 07/14/2023 0821   HGB 13.2 01/25/2012 1944   HCT 32.9 (L) 07/22/2023 0550   HCT 37.8 01/25/2012 1944   PLT 87 (L) 07/22/2023 0550   PLT 157 07/14/2023 0821   PLT 259 01/25/2012 1944   MCV 94.3 07/22/2023 0550   MCV 95 01/25/2012 1944   NEUTROABS 5.4 07/14/2023 0821   NEUTROABS 5.7 01/25/2012 1944   LYMPHSABS 0.6 (L) 07/14/2023 0821   LYMPHSABS 2.3 01/25/2012 1944   MONOABS 0.4 07/14/2023 0821   MONOABS 0.6 01/25/2012 1944   EOSABS 0.0 07/14/2023 0821   EOSABS 0.2 01/25/2012 1944   BASOSABS 0.0 07/14/2023 0821   BASOSABS 0.1 01/25/2012 1944   Comprehensive Metabolic Panel:    Component Value Date/Time   NA 141 07/26/2023 0430   NA 140 01/25/2012 1944   K 3.7 07/26/2023 0430   K 3.7 01/25/2012 1944   CL 105 07/26/2023 0430   CL  108 (H) 01/25/2012 1944   CO2 28 07/26/2023 0430   CO2 22 01/25/2012 1944   BUN 27 (H)  07/26/2023 0430   BUN 14 01/25/2012 1944   CREATININE 0.66 07/26/2023 0430   CREATININE 0.71 07/14/2023 0821   CREATININE 0.58 (L) 03/24/2012 0719   GLUCOSE 95 07/26/2023 0430   GLUCOSE 101 (H) 01/25/2012 1944   CALCIUM 8.7 (L) 07/26/2023 0430   CALCIUM 9.2 01/25/2012 1944   AST 30 07/20/2023 2113   AST 17 07/14/2023 0821   ALT 39 07/20/2023 2113   ALT 21 07/14/2023 0821   ALT 38 01/01/2012 0525   ALKPHOS 67 07/20/2023 2113   ALKPHOS 89 01/01/2012 0525   BILITOT 0.8 07/20/2023 2113   BILITOT 0.3 07/14/2023 0821   PROT 6.6 07/20/2023 2113   PROT 7.5 01/01/2012 0525   ALBUMIN 3.0 (L) 07/20/2023 2113   ALBUMIN 3.7 01/01/2012 0525    RADIOGRAPHIC STUDIES: DG Chest Port 1 View Result Date: 07/24/2023 CLINICAL DATA:  Dyspnea, cough. EXAM: PORTABLE CHEST 1 VIEW COMPARISON:  Jul 20, 2023. FINDINGS: Stable cardiomediastinal silhouette. Right internal jugular Port-A-Cath is unchanged. Minimal bibasilar subsegmental atelectasis is noted. Bony thorax is unremarkable. IMPRESSION: Minimal bibasilar subsegmental atelectasis. Electronically Signed   By: Rosalene Colon M.D.   On: 07/24/2023 10:24   CT Angio Chest/Abd/Pel for Dissection W and/or W/WO Result Date: 07/21/2023 CLINICAL DATA:  severe substernal pleuritic chest pain. History of lung cancer. COPD. * Tracking Code: BO * EXAM: CT ANGIOGRAPHY CHEST, ABDOMEN AND PELVIS TECHNIQUE: Non-contrast CT of the chest was initially obtained. Multidetector CT imaging through the chest, abdomen and pelvis was performed using the standard protocol during bolus administration of intravenous contrast. Multiplanar reconstructed images and MIPs were obtained and reviewed to evaluate the vascular anatomy. RADIATION DOSE REDUCTION: This exam was performed according to the departmental dose-optimization program which includes automated exposure control, adjustment of the mA and/or kV according to patient size and/or use of iterative reconstruction technique.  CONTRAST:  OMNIPAQUE  IOHEXOL  350 MG/ML SOLN COMPARISON:  CT Angiography chest from 07/12/2023 and nuclear medicine PET scan from 04/22/2023 FINDINGS: CTA CHEST FINDINGS Cardiovascular: No intramural hematoma noted in the thoracic aorta on the unenhanced images. Thoracic aorta is normal in caliber without aneurysm, dissection, vasculitis or significant stenosis. There is satisfactory opacification of pulmonary artery branches. There is no embolism up to the proximal subsegmental pulmonary artery level. Normal cardiac size. No pericardial effusion. No aortic aneurysm. There are coronary artery calcifications, in keeping with coronary artery disease. There are also mild peripheral atherosclerotic vascular calcifications of thoracic aorta and its major branches. Mediastinum/Nodes: Visualized thyroid gland appears grossly unremarkable. No solid / cystic mediastinal masses. The esophagus is nondistended precluding optimal assessment. There is an enlarged precarinal lymph node measuring 2.0 x 2.5 cm, slightly decreased in size since the prior study, compatible with metastases. There multiple additional mildly prominent mediastinal and bilateral hilar lymph nodes which do not meet the size criteria and appears slightly decreased since the prior study. No axillary lymphadenopathy by size criteria. Lungs/Pleura: The central tracheo-bronchial tree is patent. Mild upper lobe predominant centrilobular emphysematous changes. There are patchy areas of linear, plate-like atelectasis and/or scarring throughout bilateral lungs. No mass or consolidation. No pleural effusion or pneumothorax. There is a stable 4 x 6 mm noncalcified nodule in the right upper lobe (series 8, image 67). No new suspicious lung nodule. Musculoskeletal: A CT Port-a-Cath is seen in the right upper chest wall with the catheter terminating in the cavo-atrial  junction region. Visualized soft tissues of the chest wall are otherwise grossly unremarkable. No  suspicious osseous lesions. There are mild multilevel degenerative changes in the visualized spine. Partially seen lower cervical spinal fixation hardware. Review of the MIP images confirms the above findings. CTA ABDOMEN AND PELVIS FINDINGS VASCULAR Aorta: Normal caliber aorta without aneurysm, dissection, vasculitis or significant stenosis. Celiac: Patent without evidence of aneurysm, dissection, vasculitis or significant stenosis. SMA: Patent without evidence of aneurysm, dissection, vasculitis or significant stenosis. 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 All renal arteries are patent without evidence of aneurysm, dissection, vasculitis, fibromuscular dysplasia or significant stenosis. IMA: Patent without evidence of aneurysm, dissection, vasculitis or significant stenosis. Inflow: Patent without evidence of aneurysm, dissection, vasculitis or significant stenosis. Veins: No obvious venous abnormality within the limitations of this arterial phase study. Review of the MIP images confirms the above findings. NON-VASCULAR Hepatobiliary: The liver is normal in size. Non-cirrhotic configuration. No suspicious mass. No intrahepatic or extrahepatic bile duct dilation. Gallbladder is surgically absent. Pancreas: Unremarkable. No pancreatic ductal dilatation or surrounding inflammatory changes. Spleen: Within normal limits. No focal lesion. Adrenals/Urinary Tract: There is a stable 2.1 x 2.7 cm right adrenal adenoma. Stable left adrenal thickening without discrete nodule. No suspicious renal mass. There is a stable partially exophytic hypoattenuating structure arising from the right kidney upper pole, which is too small to adequately characterize but unchanged since the prior PET-CT scan and was not FDG avid, favoring proteinaceous/hemorrhagic cyst. There is a smaller simple cyst in the left kidney upper pole, anteriorly. No obstructive uropathy on either side. There is  small focus of dystrophic calcification along the right kidney lower pole, posterolaterally. Nephroureterolithiasis on either side. Urinary bladder is under distended, precluding optimal assessment. However, no large mass or stones identified. No perivesical fat stranding. Stomach/Bowel: There is a small diverticulum arising from the second part of duodenum. No disproportionate dilation of the small or large bowel loops. No evidence of abnormal bowel wall thickening or inflammatory changes. The appendix is unremarkable. There are scattered diverticula throughout the colon, without imaging signs of diverticulitis. Vascular/Lymphatic: No ascites or pneumoperitoneum. No abdominal or pelvic lymphadenopathy, by size criteria. No aneurysmal dilation of the major abdominal arteries. There are mild peripheral atherosclerotic vascular calcifications of the aorta and its major branches. Reproductive: The uterus is unremarkable. No large adnexal mass. Other: There is a tiny fat containing umbilical hernia. The soft tissues and abdominal wall are otherwise unremarkable. Musculoskeletal: No suspicious osseous lesions. There are mild multilevel degenerative changes in the visualized spine. Review of the MIP images confirms the above findings. IMPRESSION: 1. No acute aortic syndrome. No acute thoracic aortic intramural hematoma. No thoracoabdominal aortic aneurysm, dissection, penetrating atherosclerotic ulcer or vasculitis. No pulmonary embolism. 2. No acute inflammatory process identified within the chest, abdomen or pelvis. 3. There is a stable 4 x 6 mm noncalcified nodule in the right upper lobe. No new suspicious lung nodule. 4. Slight interval decrease in the precarinal lymph node, currently measuring 2.0 x 2.5 cm, compatible with improving metastasis. 5. Stable right adrenal adenoma. 6. Multiple other nonacute observations, as described above. Aortic Atherosclerosis (ICD10-I70.0). Electronically Signed   By: Beula Brunswick  M.D.   On: 07/21/2023 14:24   ECHOCARDIOGRAM COMPLETE Result Date: 07/21/2023    ECHOCARDIOGRAM REPORT   Patient Name:   Brandi Fowler Date of Exam: 07/21/2023 Medical Rec #:  [id number]        Height:  65.0 in Accession #:    [id number]       Weight:       276.0 lb Date of Birth:  [birth year]        BSA:          2.268 m Patient Age:    58 years         BP:           108/76 mmHg Patient Gender: F                HR:           83 bpm. Exam Location:  [facility] Procedure: 2D Echo, Cardiac Doppler and Color Doppler (Both Spectral and Color            Flow Doppler were utilized during procedure). Indications:     Atrial Fibrillation I48.91  History:         Patient has no prior history of Echocardiogram examinations.                  COPD. Lung cancer.  Sonographer:     Broadus Canes Referring Phys:  [id number] Devorah Fonder Diagnosing Phys: Belva Boyden MD  Sonographer Comments: Technically challenging study due to limited acoustic windows, suboptimal parasternal window and suboptimal apical window. IMPRESSIONS  1. Left ventricular ejection fraction, by estimation, is 60 to 65%. The left ventricle has normal function. The left ventricle has no regional wall motion abnormalities. Left ventricular diastolic parameters are indeterminate.  2. Right ventricular systolic function is normal. The right ventricular size is normal. There is normal pulmonary artery systolic pressure. The estimated right ventricular systolic pressure is 20.1 mmHg.  3. The mitral valve is normal in structure. No evidence of mitral valve regurgitation. No evidence of mitral stenosis.  4. The aortic valve is normal in structure. Aortic valve regurgitation is not visualized. No aortic stenosis is present.  5. The inferior vena cava is dilated in size with >50% respiratory variability, suggesting right atrial pressure of 8 mmHg.  6. Rhythm is atrial fibrillation with RVR FINDINGS  Left Ventricle: Left ventricular ejection fraction, by estimation, is 60  to 65%. The left ventricle has normal function. The left ventricle has no regional wall motion abnormalities. Strain was performed and the global longitudinal strain is indeterminate. The left ventricular internal cavity size was normal in size. There is no left ventricular hypertrophy. Left ventricular diastolic parameters are indeterminate. Right Ventricle: The right ventricular size is normal. No increase in right ventricular wall thickness. Right ventricular systolic function is normal. There is normal pulmonary artery systolic pressure. The tricuspid regurgitant velocity is 1.94 m/s, and  with an assumed right atrial pressure of 5 mmHg, the estimated right ventricular systolic pressure is 20.1 mmHg. Left Atrium: Left atrial size was normal in size. Right Atrium: Right atrial size was normal in size. Pericardium: There is no evidence of pericardial effusion. Mitral Valve: The mitral valve is normal in structure. No evidence of mitral valve regurgitation. No evidence of mitral valve stenosis. Tricuspid Valve: The tricuspid valve is normal in structure. Tricuspid valve regurgitation is mild . No evidence of tricuspid stenosis. Aortic Valve: The aortic valve is normal in structure. Aortic valve regurgitation is not visualized. No aortic stenosis is present. Aortic valve mean gradient measures 4.5 mmHg. Aortic valve peak gradient measures 7.6 mmHg. Aortic valve area, by VTI measures 3.64 cm. Pulmonic Valve: The pulmonic valve was normal in structure. Pulmonic valve regurgitation is not visualized. No evidence of  pulmonic stenosis. Aorta: The aortic root is normal in size and structure. Venous: The inferior vena cava is dilated in size with greater than 50% respiratory variability, suggesting right atrial pressure of 8 mmHg. IAS/Shunts: No atrial level shunt detected by color flow Doppler. Additional Comments: 3D was performed not requiring image post processing on an independent workstation and was indeterminate.   LEFT VENTRICLE PLAX 2D LVIDd:         3.50 cm LVIDs:         2.50 cm LV PW:         0.90 cm LV IVS:        1.20 cm LVOT diam:     2.10 cm LV SV:         66 LV SV Index:   29 LVOT Area:     3.46 cm  LEFT ATRIUM           Index LA Vol (A2C): 43.2 ml 19.04 ml/m LA Vol (A4C): 40.4 ml 17.81 ml/m  AORTIC VALVE AV Area (Vmax):    3.15 cm AV Area (Vmean):   3.09 cm AV Area (VTI):     3.64 cm AV Vmax:           137.50 cm/s AV Vmean:          91.900 cm/s AV VTI:            0.181 m AV Peak Grad:      7.6 mmHg AV Mean Grad:      4.5 mmHg LVOT Vmax:         125.00 cm/s LVOT Vmean:        82.100 cm/s LVOT VTI:          0.190 m LVOT/AV VTI ratio: 1.05 MITRAL VALVE                TRICUSPID VALVE MV Area (PHT): 4.99 cm     TR Peak grad:   15.1 mmHg MV Decel Time: 152 msec     TR Vmax:        194.00 cm/s MV E velocity: 139.00 cm/s                             SHUNTS                             Systemic VTI:  0.19 m                             Systemic Diam: 2.10 cm Belva Boyden MD Electronically signed by Belva Boyden MD Signature Date/Time: 07/21/2023/10:59:25 AM    Final    DG Chest Port 1 View Result Date: 07/20/2023 CLINICAL DATA:  Shortness of breath. EXAM: PORTABLE CHEST 1 VIEW COMPARISON:  Jul 14, 2023 FINDINGS: There stable right-sided venous Port-A-Cath positioning. The cardiac silhouette is mildly enlarged and unchanged in size. Mild atelectatic changes are noted within the bilateral lung bases. No pleural effusion or pneumothorax is identified. The visualized skeletal structures are unremarkable. IMPRESSION: Stable cardiomegaly with mild bibasilar atelectasis. Electronically Signed   By: Virgle Grime M.D.   On: 07/20/2023 21:43   DG Chest 2 View Result Date: 07/14/2023 CLINICAL DATA:  Chest pain and shortness of breath. EXAM: CHEST - 2 VIEW COMPARISON:  Chest radiograph dated 05/13/2023 FINDINGS: Right-sided Port-A-Cath with tip at the cavoatrial junction. There  is mild cardiomegaly and mild vascular  congestion. No focal consolidation, pleural effusion, pneumothorax. Lower cervical ACDF. No acute osseous pathology. IMPRESSION: Mild cardiomegaly and mild vascular congestion. No focal consolidation. Electronically Signed   By: Angus Bark M.D.   On: 07/14/2023 11:03   CT Angio Chest Pulmonary Embolism (PE) W or WO Contrast Result Date: 07/12/2023 CLINICAL DATA:  Mid sternal chest pain with hemoptysis. Evaluate for pulmonary embolism. On chemotherapy for lung cancer. EXAM: CT ANGIOGRAPHY CHEST WITH CONTRAST TECHNIQUE: Multidetector CT imaging of the chest was performed using the standard protocol during bolus administration of intravenous contrast. Multiplanar CT image reconstructions and MIPs were obtained to evaluate the vascular anatomy. RADIATION DOSE REDUCTION: This exam was performed according to the departmental dose-optimization program which includes automated exposure control, adjustment of the mA and/or kV according to patient size and/or use of iterative reconstruction technique. CONTRAST:  75mL OMNIPAQUE  IOHEXOL  350 MG/ML SOLN COMPARISON:  September 04, 2023 FINDINGS: Cardiovascular: Satisfactory opacification of the pulmonary arteries to the segmental level. No evidence of pulmonary embolism. Normal heart size. No pericardial effusion. Mediastinum/Nodes: Comparison with prior examination accounting for differences in techniques no change in the large retrocaval pretracheal mass which correlate with adenopathy measuring 2.9 x 2.6 cm. No other significant mediastinal or hilar adenopathy. Lungs/Pleura: Prominence of the interstitial markings with centrilobular emphysema. Comparison with prior examinations demonstrates stable 5 mm nodule in the right upper lobe anterior segment image 72. No other suspicious pulmonary nodules. Upper Abdomen: No change in the right adrenal gland low-attenuation mass that correlate with adrenal adenoma. Musculoskeletal: Choose 1.  No bony lesions along the sternum. Review  of the MIP images confirms the above findings. IMPRESSION: *No evidence of pulmonary embolism. *Stable mediastinal adenopathy. *Stable 5 mm nodule in the right upper lobe. *Stable right adrenal adenoma. *Emphysema. *No bony lesions along the sternum. *No change in the right adrenal gland low-attenuation mass that correlate with adrenal adenoma. Electronically Signed   By: Fredrich Jefferson M.D.   On: 07/12/2023 15:09    PERFORMANCE STATUS (ECOG) : 3 - Symptomatic, >50% confined to bed  Review of Systems Unless otherwise noted, a complete review of systems is negative.  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  After discussion with hospital team, patient has decided to pursue hospice.  Dr. Valentine Gasmen and I met with patient to clarify goals.  Patient was not aware that hospice would require foregoing further cancer treatments.  However, after further discussion, she says that she is still interested in pursuing hospice at home.  She would like to have outpatient patient oncology follow-up in the future and might consider further treatment in the event of clinical improvement.  Hospice does seem reasonable in light of her multiple advanced comorbidities and likelihood of decline.  Discussed CODE STATUS.  Patient states somewhat emphatically that she would not want to be intubated or have her life prolonged artificially on other machines.  She was less certain about cardiac resuscitation but after further discussion, she verbalized decision for DNR/DNI.  PLAN: -Best supportive care -Home with  hospice -Outpatient oncology follow up in 1 month -DNR/DNI  Case and plan discussed with Drs. Valentine Gasmen and Murrel Arnt  Time Total: 45 minutes  Visit consisted of counseling and education dealing with the complex and emotionally intense issues of symptom management and palliative care in the setting of serious and potentially life-threatening illness.Greater than 50%  of this time was spent counseling and coordinating care related to the above assessment and plan.  Signed by: Gerilyn Kobus, PhD, NP-C

## 2023-07-27 NOTE — Progress Notes (Signed)
 Discharge Documentation Discharge Request Details Per Wilfredo Hanly call with patient directly "Member shares she is in the hospital, is discharging home to hospice, has been given 6 months to live. Member is set to be transported home by ambulance. Hospice has contacted her, and begun coordination..."  Sent Discharge Plan to Specialist? Yes Sent Discharge Plan to Member? No  Name Delayla Hoffmaster  Date of Communication: 2023-07-27  Date of Birth 01/22/1966  Time of Communication: 3:30 PM  Contents: ? Discharge Plan Method of Communication: ? EHR messaging Action Items? No Member Status: Discharged  Discharge Plan Member has been discharged from [facility]. Please see below Discharge Plan for details. The Member has been provided a copy of their Discharge Plan via the [facility] Care portal. If you have any questions, please contact our Clinical Team at [phone].  Name Salvatore Poe  Date of Birth 06/26/1965  Discharge Date: 2023-07-27  Discharged Reason No longer eligible: Left Contracted Provider Panel  Discharging to: Referring Specialist Referring Provider: Gerilyn Kobus, NP  Current Psychiatric Medications N/A  Psychiatric Medication Recommendation Transition N/A (no CC med recs made)  Referrals: N/A  Discharge Summary Patient transitioning to hospice home care  [place] Care Provider: Corie Diamond, [facility] Health Care Manager

## 2023-07-27 NOTE — Plan of Care (Signed)
  Problem: Pain Managment: Goal: General experience of comfort will improve and/or be controlled Outcome: Progressing   Problem: Safety: Goal: Ability to remain free from injury will improve Outcome: Progressing   Problem: Clinical Measurements: Goal: Respiratory complications will improve Outcome: Progressing   Problem: Clinical Measurements: Goal: Cardiovascular complication will be avoided Outcome: Progressing

## 2023-07-27 NOTE — Progress Notes (Addendum)
 [facility] Liaison Note  Referral received from [facility], Anne Hebblethwaite, RN, for hospice follow up at home. Met with patient to initiate education related to hospice philosophy, services, and team approach to care.  Patient verbalized understanding of information given.   Patient would like to discharge home this evening. She stated that she will need EMS transport. Patient stated that she has a WC and shower chair. Patient also has home 02, nebulizer and CPAP provided by [facility]. She stated that the 02 concentrator from [employer] is not working properly, but she reported that she does have a portable tank at home that should last several hours. [facility] will have home 02 delivered this afternoon from a different provider. Patient does not have anyone to receive the home 02, so it will have to be delivered after she gets home, she stated. Referral submitted. TOC and hospital team aware.    Please send signed and completed DNR home with the patient/family.  Please provide prescriptions at discharge as needed to ensure ongoing symptom management.   [facility] information and contact numbers given to patient  Above information shared with Warren Haber, RN, Transitions of Care Manager.     Please call with any Hospice related questions or concerns.  Thank you for the opportunity to participate in this patient's care.  Helmut Lobe, [facility] Liaison [phone]

## 2023-07-27 NOTE — Consult Note (Signed)
 [facility] CONSULT NOTE  Patient Care Team: Inc, [facility] Services as PCP - General Jerelene Monday, Deadra Everts, MD as PCP - Cardiology (Cardiology) Drake Gens, RN as Oncology Nurse Navigator Gwyn Leos, MD as Consulting Physician (Oncology)  CHIEF COMPLAINTS/PURPOSE OF CONSULTATION: Lung cancer  HISTORY OF PRESENTING ILLNESS:  Brandi Fowler 58 y.o.  female pleasant patient with a medical history significant for COPD on chronic oxygen , lung cancer stage III on chemoradiation with, schizophrenia, GERD, urolithiasis, OSA, seizures, PVD, PTSD, depression, and DDD-chronic pain at a pain management is currently in the hospital for worsening shortness of breath and cough.  Patient noted to have A-fib with RVR needing cardioversion.  Patient also noted to have a decompensation of her respiratory status-COPD exacerbation/COVID.   She is currently improved.  She is currently using CPAP as she is not able to tolerate BiPAP well.  A CTA showed no evidence of any PE; in fact showed improvement of the mediastinal adenopathy.  With regards to history of lung cancer -patient has stage III non-small cell lung carcinoma currently on chemoradiation.  However treatments have been discontinued held because of recent admission to hospital.  Today oncology has been consulted to have discussion regarding goals of care.  Patient is clinically doing better than what she was at admission.   Review of Systems  Constitutional:  Positive for malaise/fatigue. Negative for chills, diaphoresis, fever and weight loss.  HENT:  Negative for nosebleeds and sore throat.   Eyes:  Negative for double vision.  Respiratory:  Positive for cough, sputum production and shortness of breath. Negative for wheezing.   Cardiovascular:  Negative for chest pain, palpitations, orthopnea and leg swelling.  Gastrointestinal:  Negative for abdominal pain, blood in stool, constipation, diarrhea, heartburn,  melena, nausea and vomiting.  Genitourinary:  Negative for dysuria, frequency and urgency.  Musculoskeletal:  Positive for back pain and joint pain.  Skin: Negative.  Negative for itching and rash.  Neurological:  Negative for dizziness, tingling, focal weakness, weakness and headaches.  Endo/Heme/Allergies:  Does not bruise/bleed easily.  Psychiatric/Behavioral:  Negative for depression. The patient is not nervous/anxious and does not have insomnia.     MEDICAL HISTORY:  Past Medical History:  Diagnosis Date   BRBPR (bright red blood per rectum)    CAP (community acquired pneumonia)    Chronic diarrhea    Chronic kidney disease    Chronic myofascial pain    Chronic pain    Chronic venous insufficiency    Cigarette smoker    COPD (chronic obstructive pulmonary disease) (HCC)    Coronary artery disease    DDD (degenerative disc disease), cervical    DDD (degenerative disc disease), thoracic    Depression    Elevated blood pressure reading without diagnosis of hypertension    Elevated hemoglobin A1c    Female hirsutism    GERD (gastroesophageal reflux disease)    Headache    migraines   History of adenomatous polyp of colon    History of kidney stones    History of sepsis    Kidney stones    Lung cancer (HCC)    Lymphedema    Mediastinal adenopathy    Morbid obesity (HCC)    Obesity, Class III, BMI 40-49.9 (morbid obesity)    OSA (obstructive sleep apnea)    Paranoid schizophrenia (HCC)    Peripheral vascular disease (HCC)    Primary cancer of left upper lobe of lung (HCC)    PTSD (post-traumatic stress disorder)  RLS (restless legs syndrome)    Seizures (HCC)    last seizure in Jan 2025   Sleep apnea    Supplemental oxygen  dependent    2-3 L [place]   Syncope     SURGICAL HISTORY: Past Surgical History:  Procedure Laterality Date   CERVICAL SPINE SURGERY     Fusion C6-7   CHOLECYSTECTOMY     COLONOSCOPY WITH PROPOFOL  N/A 11/18/2014   Procedure: COLONOSCOPY  WITH PROPOFOL ;  Surgeon: Cassie Click, MD;  Location: [facility] ENDOSCOPY;  Service: Endoscopy;  Laterality: N/A;   ESOPHAGOGASTRODUODENOSCOPY     FLEXIBLE BRONCHOSCOPY N/A 05/13/2023   Procedure: BRONCHOSCOPY, FLEXIBLE;  Surgeon: Erskin Hearing, MD;  Location: [facility] ORS;  Service: Thoracic;  Laterality: N/A;   IR IMAGING GUIDED PORT INSERTION  06/03/2023   KNEE SURGERY Left    polpectomy N/A    TUBAL LIGATION     VIDEO BRONCHOSCOPY WITH ENDOBRONCHIAL ULTRASOUND N/A 05/13/2023   Procedure: BRONCHOSCOPY, WITH EBUS;  Surgeon: Erskin Hearing, MD;  Location: [facility] ORS;  Service: Thoracic;  Laterality: N/A;    SOCIAL HISTORY: Social History   Socioeconomic History   Marital status: Married    Spouse name: Not on file   Number of children: Not on file   Years of education: Not on file   Highest education level: Not on file  Occupational History   Not on file  Tobacco Use   Smoking status: Every Day    Current packs/day: 0.25    Average packs/day: 0.3 packs/day for 45.0 years (11.3 ttl pk-yrs)    Types: Cigarettes   Smokeless tobacco: Former  Building services engineer status: Never Used  Substance and Sexual Activity   Alcohol use: Not Currently    Alcohol/week: 1.0 standard drink of alcohol    Types: 1 Standard drinks or equivalent per week   Drug use: No   Sexual activity: Yes  Other Topics Concern   Not on file  Social History Narrative   Not on file   Social Drivers of Health   Financial Resource Strain: Low Risk  (03/29/2023)   Received [facility]   Overall Financial Resource Strain (CARDIA)    Difficulty of Paying Living Expenses: Not hard at all  Food Insecurity: No Food Insecurity (07/21/2023)   Hunger Vital Sign    Worried About Running Out of Food in the Last Year: Never true    Ran Out of Food in the Last Year: Never true  Transportation Needs: No Transportation Needs (07/21/2023)   PRAPARE - Administrator, Civil Service (Medical): No     Lack of Transportation (Non-Medical): No  Physical Activity: Insufficiently Active (08/31/2018)   Exercise Vital Sign    Days of Exercise per Week: 7 days    Minutes of Exercise per Session: 10 min  Stress: Stress Concern Present (08/31/2018)   [employer] of Occupational Health - Occupational Stress Questionnaire    Feeling of Stress : Very much  Social Connections: Moderately Isolated (07/21/2023)   Social Connection and Isolation Panel [NHANES]    Frequency of Communication with Friends and Family: More than three times a week    Frequency of Social Gatherings with Friends and Family: Once a week    Attends Religious Services: Never    Database administrator or Organizations: No    Attends Banker Meetings: Never    Marital Status: Living with partner  Intimate Partner Violence: Not At Risk (07/21/2023)  Humiliation, Afraid, Rape, and Kick questionnaire    Fear of Current or Ex-Partner: No    Emotionally Abused: No    Physically Abused: No    Sexually Abused: No    FAMILY HISTORY: Family History  Problem Relation Age of Onset   Alcohol abuse Mother    Cancer Mother    COPD Mother    Hearing loss Mother    Vision loss Mother    Alcohol abuse Father    Depression Father    Early death Father    Mental illness Father     ALLERGIES:  is allergic to aripiprazole, baclofen, buprenorphine-naloxone , clonazepam, cyclobenzaprine, doxepin, fentanyl , lurasidone, methadone, oxymorphone, penicillins, pregabalin , quetiapine, tolmetin, trazodone , acetaminophen , bupropion, colestipol, gabapentin, hydrocodone, paroxetine, sertraline, amitriptyline hcl, carbamazepine, ciprofloxacin hcl, codeine, divalproex  sodium, etodolac, haloperidol , hyaluronate sodium, hydrochlorothiazide, ibuprofen, indocin [indomethacin], ketoprofen, latuda [lurasidone hcl], lidocaine , meloxicam, metformin and related, methocarbamol , nabumetone, naproxen, ondansetron , opana [oxymorphone hcl], oxaprozin,  perphenazine , risperidone  and related, seroquel [quetiapine fumarate], silver, suboxone [buprenorphine hcl-naloxone  hcl], tramadol, valproic acid, zoloft [sertraline hcl], bromfenac, buprenorphine, ciprofloxacin, clindamycin, clindamycin/lincomycin, flurbiprofen, metronidazole, mirtazapine, and other.  MEDICATIONS:  Current Facility-Administered Medications  Medication Dose Route Frequency Provider Last Rate Last Admin   acetaminophen  (TYLENOL ) tablet 650 mg  650 mg Oral Q6H PRN Patel, Kishan S, RPH       Or   acetaminophen  (TYLENOL ) suppository 650 mg  650 mg Rectal Q6H PRN Patel, Kishan S, RPH       amiodarone  (PACERONE ) tablet 400 mg  400 mg Oral BID Harrold Lincoln, MD   400 mg at 07/27/23 8657   Followed by   [name] ON 07/30/2023] amiodarone  (PACERONE ) tablet 200 mg  200 mg Oral Daily O'Neal, Cathay Clonts, MD       apixaban  (ELIQUIS ) tablet 5 mg  5 mg Oral BID Ronald Cockayne, NP   5 mg at 07/27/23 8469   budesonide -glycopyrrolate -formoterol  (BREZTRI ) 160-9-4.8 MCG/ACT inhaler 2 puff  2 puff Inhalation BID Elisabeth Guild, NP   2 puff at 07/27/23 6295   Chlorhexidine  Gluconate Cloth 2 % PADS 6 each  6 each Topical Daily Janeane Mealy, MD   6 each at 07/27/23 0919   diphenhydrAMINE  (BENADRYL ) capsule 25 mg  25 mg Oral Q6H PRN Mansy, Jan A, MD   25 mg at 07/27/23 1206   Ensifentrine  SUSP 1 vial  1 vial Inhalation BID Elisabeth Guild, NP   1 vial at 07/27/23 1126   guaiFENesin  (MUCINEX ) 12 hr tablet 600 mg  600 mg Oral BID Mansy, Jan A, MD   600 mg at 07/27/23 0919   ipratropium (ATROVENT  HFA) inhaler 2 puff  2 puff Inhalation Q6H Merrill, Kristin A, RPH   2 puff at 07/27/23 0921   levalbuterol  (XOPENEX  HFA) inhaler 1 puff  1 puff Inhalation Q6H Dezii, Alexandra, DO   1 puff at 07/27/23 2841   LORazepam  (ATIVAN ) tablet 1 mg  1 mg Oral BID PRN Dezii, Alexandra, DO       magnesium  hydroxide (MILK OF MAGNESIA) suspension 30 mL  30 mL Oral Daily PRN Mansy, Jan A, MD        methylPREDNISolone  sodium succinate (SOLU-MEDROL ) 40 mg/mL injection 40 mg  40 mg Intravenous Q12H Dezii, Alexandra, DO   40 mg at 07/27/23 0640   morphine  (PF) 2 MG/ML injection 2 mg  2 mg Intravenous Q2H PRN Mansy, Jan A, MD   2 mg at 07/27/23 1127   oxyCODONE  (Oxy IR/ROXICODONE ) immediate release tablet 15 mg  15 mg Oral  Q6H Dezii, Alexandra, DO   15 mg at 07/27/23 0919   roflumilast  (DALIRESP ) tablet 500 mcg  500 mcg Oral Jerone Moorman, NP   500 mcg at 07/27/23 0918   rOPINIRole  (REQUIP ) tablet 1 mg  1 mg Oral QHS Mansy, Jan A, MD   1 mg at 07/26/23 2150   sodium chloride  flush (NS) 0.9 % injection 10-40 mL  10-40 mL Intracatheter Q12H Wouk, Haynes Lips, MD   10 mL at 07/27/23 0920   sodium chloride  flush (NS) 0.9 % injection 10-40 mL  10-40 mL Intracatheter PRN Wouk, Haynes Lips, MD       tiZANidine  (ZANAFLEX ) tablet 4 mg  4 mg Oral Q6H Dezii, Alexandra, DO   4 mg at 07/27/23 1127   traZODone  (DESYREL ) tablet 25 mg  25 mg Oral QHS PRN Mansy, Jan A, MD   25 mg at 07/21/23 2144    PHYSICAL EXAMINATION:   Vitals:   07/27/23 1211 07/27/23 1228  BP: (!) 196/126 (!) 162/97  Pulse: (!) 102 91  Resp:  20  Temp:  97.9 F (36.6 C)  SpO2: (!) 86% 98%   Filed Weights   07/20/23 2110  Weight: 276 lb (125.2 kg)    Physical Exam Vitals and nursing note reviewed.  HENT:     Head: Normocephalic and atraumatic.     Mouth/Throat:     Pharynx: Oropharynx is clear.  Eyes:     Extraocular Movements: Extraocular movements intact.     Pupils: Pupils are equal, round, and reactive to light.  Cardiovascular:     Rate and Rhythm: Normal rate and regular rhythm.  Pulmonary:     Comments: Decreased breath sounds bilaterally.  Abdominal:     Palpations: Abdomen is soft.  Musculoskeletal:        General: Normal range of motion.     Cervical back: Normal range of motion.  Skin:    General: Skin is warm.  Neurological:     General: No focal deficit present.     Mental Status: She is  alert and oriented to person, place, and time.  Psychiatric:        Behavior: Behavior normal.        Judgment: Judgment normal.     LABORATORY DATA:  I have reviewed the data as listed Lab Results  Component Value Date   WBC 4.6 07/22/2023   HGB 11.3 (L) 07/22/2023   HCT 32.9 (L) 07/22/2023   MCV 94.3 07/22/2023   PLT 87 (L) 07/22/2023   Recent Labs    07/07/23 0814 07/14/23 0821 07/14/23 1020 07/20/23 2113 07/21/23 0450 07/24/23 0509 07/25/23 0320 07/26/23 0430  NA 137 138   < > 135   < > 140 138 141  K 4.2 3.7   < > 3.9   < > 3.6 3.8 3.7  CL 103 106   < > 103   < > 105 103 105  CO2 24 26   < > 18*   < > 26 26 28   GLUCOSE 117* 118*   < > 161*   < > 114* 144* 95  BUN 22* 20   < > 27*   < > 21* 32* 27*  CREATININE 0.86 0.71   < > 1.18*   < > 0.68 0.94 0.66  CALCIUM 9.2 8.7*   < > 8.3*   < > 8.6* 8.7* 8.7*  GFRNONAA >60 >60   < > 53*   < > >60 >60 >60  PROT  7.2 6.2*  --  6.6  --   --   --   --   ALBUMIN 3.7 3.2*  --  3.0*  --   --   --   --   AST 19 17  --  30  --   --   --   --   ALT 23 21  --  39  --   --   --   --   ALKPHOS 85 64  --  67  --   --   --   --   BILITOT 0.3 0.3  --  0.8  --   --   --   --    < > = values in this interval not displayed.    RADIOGRAPHIC STUDIES: I have personally reviewed the radiological images as listed and agreed with the findings in the report. DG Chest Port 1 View Result Date: 07/24/2023 CLINICAL DATA:  Dyspnea, cough. EXAM: PORTABLE CHEST 1 VIEW COMPARISON:  Jul 20, 2023. FINDINGS: Stable cardiomediastinal silhouette. Right internal jugular Port-A-Cath is unchanged. Minimal bibasilar subsegmental atelectasis is noted. Bony thorax is unremarkable. IMPRESSION: Minimal bibasilar subsegmental atelectasis. Electronically Signed   By: Rosalene Colon M.D.   On: 07/24/2023 10:24   CT Angio Chest/Abd/Pel for Dissection W and/or W/WO Result Date: 07/21/2023 CLINICAL DATA:  severe substernal pleuritic chest pain. History of lung cancer.  COPD. * Tracking Code: BO * EXAM: CT ANGIOGRAPHY CHEST, ABDOMEN AND PELVIS TECHNIQUE: Non-contrast CT of the chest was initially obtained. Multidetector CT imaging through the chest, abdomen and pelvis was performed using the standard protocol during bolus administration of intravenous contrast. Multiplanar reconstructed images and MIPs were obtained and reviewed to evaluate the vascular anatomy. RADIATION DOSE REDUCTION: This exam was performed according to the departmental dose-optimization program which includes automated exposure control, adjustment of the mA and/or kV according to patient size and/or use of iterative reconstruction technique. CONTRAST:  OMNIPAQUE  IOHEXOL  350 MG/ML SOLN COMPARISON:  CT Angiography chest from 07/12/2023 and nuclear medicine PET scan from 04/22/2023 FINDINGS: CTA CHEST FINDINGS Cardiovascular: No intramural hematoma noted in the thoracic aorta on the unenhanced images. Thoracic aorta is normal in caliber without aneurysm, dissection, vasculitis or significant stenosis. There is satisfactory opacification of pulmonary artery branches. There is no embolism up to the proximal subsegmental pulmonary artery level. Normal cardiac size. No pericardial effusion. No aortic aneurysm. There are coronary artery calcifications, in keeping with coronary artery disease. There are also mild peripheral atherosclerotic vascular calcifications of thoracic aorta and its major branches. Mediastinum/Nodes: Visualized thyroid gland appears grossly unremarkable. No solid / cystic mediastinal masses. The esophagus is nondistended precluding optimal assessment. There is an enlarged precarinal lymph node measuring 2.0 x 2.5 cm, slightly decreased in size since the prior study, compatible with metastases. There multiple additional mildly prominent mediastinal and bilateral hilar lymph nodes which do not meet the size criteria and appears slightly decreased since the prior study. No axillary  lymphadenopathy by size criteria. Lungs/Pleura: The central tracheo-bronchial tree is patent. Mild upper lobe predominant centrilobular emphysematous changes. There are patchy areas of linear, plate-like atelectasis and/or scarring throughout bilateral lungs. No mass or consolidation. No pleural effusion or pneumothorax. There is a stable 4 x 6 mm noncalcified nodule in the right upper lobe (series 8, image 67). No new suspicious lung nodule. Musculoskeletal: A CT Port-a-Cath is seen in the right upper chest wall with the catheter terminating in the cavo-atrial junction region. Visualized soft tissues of the  chest wall are otherwise grossly unremarkable. No suspicious osseous lesions. There are mild multilevel degenerative changes in the visualized spine. Partially seen lower cervical spinal fixation hardware. Review of the MIP images confirms the above findings. CTA ABDOMEN AND PELVIS FINDINGS VASCULAR Aorta: Normal caliber aorta without aneurysm, dissection, vasculitis or significant stenosis. Celiac: Patent without evidence of aneurysm, dissection, vasculitis or significant stenosis. SMA: Patent without evidence of aneurysm, dissection, vasculitis or significant stenosis. 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 All renal arteries are patent without evidence of aneurysm, dissection, vasculitis, fibromuscular dysplasia or significant stenosis. IMA: Patent without evidence of aneurysm, dissection, vasculitis or significant stenosis. Inflow: Patent without evidence of aneurysm, dissection, vasculitis or significant stenosis. Veins: No obvious venous abnormality within the limitations of this arterial phase study. Review of the MIP images confirms the above findings. NON-VASCULAR Hepatobiliary: The liver is normal in size. Non-cirrhotic configuration. No suspicious mass. No intrahepatic or extrahepatic bile duct dilation. Gallbladder is surgically absent. Pancreas:  Unremarkable. No pancreatic ductal dilatation or surrounding inflammatory changes. Spleen: Within normal limits. No focal lesion. Adrenals/Urinary Tract: There is a stable 2.1 x 2.7 cm right adrenal adenoma. Stable left adrenal thickening without discrete nodule. No suspicious renal mass. There is a stable partially exophytic hypoattenuating structure arising from the right kidney upper pole, which is too small to adequately characterize but unchanged since the prior PET-CT scan and was not FDG avid, favoring proteinaceous/hemorrhagic cyst. There is a smaller simple cyst in the left kidney upper pole, anteriorly. No obstructive uropathy on either side. There is small focus of dystrophic calcification along the right kidney lower pole, posterolaterally. Nephroureterolithiasis on either side. Urinary bladder is under distended, precluding optimal assessment. However, no large mass or stones identified. No perivesical fat stranding. Stomach/Bowel: There is a small diverticulum arising from the second part of duodenum. No disproportionate dilation of the small or large bowel loops. No evidence of abnormal bowel wall thickening or inflammatory changes. The appendix is unremarkable. There are scattered diverticula throughout the colon, without imaging signs of diverticulitis. Vascular/Lymphatic: No ascites or pneumoperitoneum. No abdominal or pelvic lymphadenopathy, by size criteria. No aneurysmal dilation of the major abdominal arteries. There are mild peripheral atherosclerotic vascular calcifications of the aorta and its major branches. Reproductive: The uterus is unremarkable. No large adnexal mass. Other: There is a tiny fat containing umbilical hernia. The soft tissues and abdominal wall are otherwise unremarkable. Musculoskeletal: No suspicious osseous lesions. There are mild multilevel degenerative changes in the visualized spine. Review of the MIP images confirms the above findings. IMPRESSION: 1. No acute aortic  syndrome. No acute thoracic aortic intramural hematoma. No thoracoabdominal aortic aneurysm, dissection, penetrating atherosclerotic ulcer or vasculitis. No pulmonary embolism. 2. No acute inflammatory process identified within the chest, abdomen or pelvis. 3. There is a stable 4 x 6 mm noncalcified nodule in the right upper lobe. No new suspicious lung nodule. 4. Slight interval decrease in the precarinal lymph node, currently measuring 2.0 x 2.5 cm, compatible with improving metastasis. 5. Stable right adrenal adenoma. 6. Multiple other nonacute observations, as described above. Aortic Atherosclerosis (ICD10-I70.0). Electronically Signed   By: Beula Brunswick M.D.   On: 07/21/2023 14:24   ECHOCARDIOGRAM COMPLETE Result Date: 07/21/2023    ECHOCARDIOGRAM REPORT   Patient Name:   Curlie Doughty Date of Exam: 07/21/2023 Medical Rec #:  [id number]        Height:       65.0 in Accession #:  [id number]       Weight:       276.0 lb Date of Birth:  04/05/1965        BSA:          2.268 m Patient Age:    58 years         BP:           108/76 mmHg Patient Gender: F                HR:           83 bpm. Exam Location:  [facility] Procedure: 2D Echo, Cardiac Doppler and Color Doppler (Both Spectral and Color            Flow Doppler were utilized during procedure). Indications:     Atrial Fibrillation I48.91  History:         Patient has no prior history of Echocardiogram examinations.                  COPD. Lung cancer.  Sonographer:     Broadus Canes Referring Phys:  [id number] Devorah Fonder Diagnosing Phys: Belva Boyden MD  Sonographer Comments: Technically challenging study due to limited acoustic windows, suboptimal parasternal window and suboptimal apical window. IMPRESSIONS  1. Left ventricular ejection fraction, by estimation, is 60 to 65%. The left ventricle has normal function. The left ventricle has no regional wall motion abnormalities. Left ventricular diastolic parameters are indeterminate.  2. Right ventricular  systolic function is normal. The right ventricular size is normal. There is normal pulmonary artery systolic pressure. The estimated right ventricular systolic pressure is 20.1 mmHg.  3. The mitral valve is normal in structure. No evidence of mitral valve regurgitation. No evidence of mitral stenosis.  4. The aortic valve is normal in structure. Aortic valve regurgitation is not visualized. No aortic stenosis is present.  5. The inferior vena cava is dilated in size with >50% respiratory variability, suggesting right atrial pressure of 8 mmHg.  6. Rhythm is atrial fibrillation with RVR FINDINGS  Left Ventricle: Left ventricular ejection fraction, by estimation, is 60 to 65%. The left ventricle has normal function. The left ventricle has no regional wall motion abnormalities. Strain was performed and the global longitudinal strain is indeterminate. The left ventricular internal cavity size was normal in size. There is no left ventricular hypertrophy. Left ventricular diastolic parameters are indeterminate. Right Ventricle: The right ventricular size is normal. No increase in right ventricular wall thickness. Right ventricular systolic function is normal. There is normal pulmonary artery systolic pressure. The tricuspid regurgitant velocity is 1.94 m/s, and  with an assumed right atrial pressure of 5 mmHg, the estimated right ventricular systolic pressure is 20.1 mmHg. Left Atrium: Left atrial size was normal in size. Right Atrium: Right atrial size was normal in size. Pericardium: There is no evidence of pericardial effusion. Mitral Valve: The mitral valve is normal in structure. No evidence of mitral valve regurgitation. No evidence of mitral valve stenosis. Tricuspid Valve: The tricuspid valve is normal in structure. Tricuspid valve regurgitation is mild . No evidence of tricuspid stenosis. Aortic Valve: The aortic valve is normal in structure. Aortic valve regurgitation is not visualized. No aortic stenosis is  present. Aortic valve mean gradient measures 4.5 mmHg. Aortic valve peak gradient measures 7.6 mmHg. Aortic valve area, by VTI measures 3.64 cm. Pulmonic Valve: The pulmonic valve was normal in structure. Pulmonic valve regurgitation is not visualized. No evidence of pulmonic stenosis. Aorta: The aortic root  is normal in size and structure. Venous: The inferior vena cava is dilated in size with greater than 50% respiratory variability, suggesting right atrial pressure of 8 mmHg. IAS/Shunts: No atrial level shunt detected by color flow Doppler. Additional Comments: 3D was performed not requiring image post processing on an independent workstation and was indeterminate.  LEFT VENTRICLE PLAX 2D LVIDd:         3.50 cm LVIDs:         2.50 cm LV PW:         0.90 cm LV IVS:        1.20 cm LVOT diam:     2.10 cm LV SV:         66 LV SV Index:   29 LVOT Area:     3.46 cm  LEFT ATRIUM           Index LA Vol (A2C): 43.2 ml 19.04 ml/m LA Vol (A4C): 40.4 ml 17.81 ml/m  AORTIC VALVE AV Area (Vmax):    3.15 cm AV Area (Vmean):   3.09 cm AV Area (VTI):     3.64 cm AV Vmax:           137.50 cm/s AV Vmean:          91.900 cm/s AV VTI:            0.181 m AV Peak Grad:      7.6 mmHg AV Mean Grad:      4.5 mmHg LVOT Vmax:         125.00 cm/s LVOT Vmean:        82.100 cm/s LVOT VTI:          0.190 m LVOT/AV VTI ratio: 1.05 MITRAL VALVE                TRICUSPID VALVE MV Area (PHT): 4.99 cm     TR Peak grad:   15.1 mmHg MV Decel Time: 152 msec     TR Vmax:        194.00 cm/s MV E velocity: 139.00 cm/s                             SHUNTS                             Systemic VTI:  0.19 m                             Systemic Diam: 2.10 cm Belva Boyden MD Electronically signed by Belva Boyden MD Signature Date/Time: 07/21/2023/10:59:25 AM    Final    DG Chest Port 1 View Result Date: 07/20/2023 CLINICAL DATA:  Shortness of breath. EXAM: PORTABLE CHEST 1 VIEW COMPARISON:  Jul 14, 2023 FINDINGS: There stable right-sided venous  Port-A-Cath positioning. The cardiac silhouette is mildly enlarged and unchanged in size. Mild atelectatic changes are noted within the bilateral lung bases. No pleural effusion or pneumothorax is identified. The visualized skeletal structures are unremarkable. IMPRESSION: Stable cardiomegaly with mild bibasilar atelectasis. Electronically Signed   By: Virgle Grime M.D.   On: 07/20/2023 21:43   DG Chest 2 View Result Date: 07/14/2023 CLINICAL DATA:  Chest pain and shortness of breath. EXAM: CHEST - 2 VIEW COMPARISON:  Chest radiograph dated 05/13/2023 FINDINGS: Right-sided Port-A-Cath with tip at the cavoatrial junction. There is mild cardiomegaly and mild vascular  congestion. No focal consolidation, pleural effusion, pneumothorax. Lower cervical ACDF. No acute osseous pathology. IMPRESSION: Mild cardiomegaly and mild vascular congestion. No focal consolidation. Electronically Signed   By: Angus Bark M.D.   On: 07/14/2023 11:03   CT Angio Chest Pulmonary Embolism (PE) W or WO Contrast Result Date: 07/12/2023 CLINICAL DATA:  Mid sternal chest pain with hemoptysis. Evaluate for pulmonary embolism. On chemotherapy for lung cancer. EXAM: CT ANGIOGRAPHY CHEST WITH CONTRAST TECHNIQUE: Multidetector CT imaging of the chest was performed using the standard protocol during bolus administration of intravenous contrast. Multiplanar CT image reconstructions and MIPs were obtained to evaluate the vascular anatomy. RADIATION DOSE REDUCTION: This exam was performed according to the departmental dose-optimization program which includes automated exposure control, adjustment of the mA and/or kV according to patient size and/or use of iterative reconstruction technique. CONTRAST:  75mL OMNIPAQUE  IOHEXOL  350 MG/ML SOLN COMPARISON:  September 04, 2023 FINDINGS: Cardiovascular: Satisfactory opacification of the pulmonary arteries to the segmental level. No evidence of pulmonary embolism. Normal heart size. No pericardial  effusion. Mediastinum/Nodes: Comparison with prior examination accounting for differences in techniques no change in the large retrocaval pretracheal mass which correlate with adenopathy measuring 2.9 x 2.6 cm. No other significant mediastinal or hilar adenopathy. Lungs/Pleura: Prominence of the interstitial markings with centrilobular emphysema. Comparison with prior examinations demonstrates stable 5 mm nodule in the right upper lobe anterior segment image 72. No other suspicious pulmonary nodules. Upper Abdomen: No change in the right adrenal gland low-attenuation mass that correlate with adrenal adenoma. Musculoskeletal: Choose 1.  No bony lesions along the sternum. Review of the MIP images confirms the above findings. IMPRESSION: *No evidence of pulmonary embolism. *Stable mediastinal adenopathy. *Stable 5 mm nodule in the right upper lobe. *Stable right adrenal adenoma. *Emphysema. *No bony lesions along the sternum. *No change in the right adrenal gland low-attenuation mass that correlate with adrenal adenoma. Electronically Signed   By: Fredrich Jefferson M.D.   On: 07/12/2023 19:60    # 58 year old female patient with multiple medical problems including COPD end-stage-chronic oxygen  use; stage III lung cancer chronic pain-admitted hospital for COPD exacerbation/A-fib with RVR.  # Stage III lung cancer-currently chemoradiation-interrupted because of hospitalization.  CTA-in the hospital showed improvement of the mediastinal adenopathy.  # Acute on chronic respiratory failure-COPD exacerbation/COVID/A-fib RVR-s/p cardioversion improved.  # Chronic pain  # Psychiatric: Schizophrenia  # Recommendations/plan:  # Prolonged discussion with the patient regarding overall guarded prognosis of lung cancer in the context of her multiple medical problems including advanced COPD/A-fib with RVR.  Patient feels that she is not ready for any active therapies at this time given her respiratory decompensation/and  also the need for back an fourth transportation/logistics etc.  However patient explicitly states that "she is not ready to give up".  # I think it is reasonable to involve hospice at her own home given the acute decompensation of respiratory status-which seems a separate issue from her underlying lung cancer.  However she understands that the treatment of lung cancer is obviously intertwined with the stability of her respiratory and cardiac status.   # DNR/DNI was discussed with patient- patient decided on limited CPR.   # I will follow the patient in the clinic in approximately a month or so-to evaluate the patient's clinical status.   Thank you Dr. Murrel Arnt for allowing me to participate in the care of your pleasant patient. Please do not hesitate to contact me with questions or concerns in the interim.  Discussed with Dr.  Murrel Arnt; and [employer]. Will inform radiation oncology also.      Gwyn Leos, MD 07/27/2023 1:26 PM

## 2023-07-27 NOTE — Progress Notes (Signed)
 Given patient's recent admission to hospital-acute decompensation of respiratory status- on hospice.  Will hold therapies.  Please have patient follow-up in approximately 1 month-MD labs CBC CMP port.  No chemotherapy.  GB

## 2023-07-27 NOTE — Progress Notes (Signed)
  Chaplain On-Call attempted to visit patient in response to Spiritual Care Consult Order from Roise Cleaver, MD.  The patient was being attended to by the care team and unavailable for a visit.  Will refer to Evening Chaplain for follow up and completion of the [facility] Order.  Chaplain Charlie Drelyn Pistilli Marquita Situ., BCC

## 2023-07-27 NOTE — Discharge Summary (Addendum)
 Physician Discharge Summary   Patient: Brandi Fowler MRN: [id number] DOB: [birth year]  Admit date:     07/20/2023  Discharge date: 07/27/23  Discharge Physician: Lorita Rosa   PCP: [facility]   Recommendations at discharge:   You are being discharged to home with hospice services Continue current medications, discuss tapering off medications with hospice after arrival at home Continue home O2/CPAP as needed Out of facility DNR form is signed  Discharge Diagnoses: Principal Problem:   COPD exacerbation (HCC) Active Problems:   Paroxysmal atrial fibrillation with RVR (HCC)   Seizure disorder (HCC)   Schizophrenia (HCC)   Morbid obesity (HCC)   Obstructive sleep apnea syndrome   Chronic pain   Lung cancer (HCC)   Pulmonary hypertension (HCC)   Restless leg syndrome   COVID-19    Hospital Course: 58yo with h/o COPD, lung CA, schizophrenia, OSA, seizure d/o, and PVD who presented on 5/14 with SOB, cough.  She was found to be in afib with RVR into the 200s and was cardioverted with return to sinus rhythm.  She was found to have acute hypoxic respiratory failure associated with COVID-19 infection.  She is considering a transition to full comfort measures.  Assessment and Plan:  A-fib RVR, new Initial rate 200s Cardioverted with EMS, reportedly converted to sinus rhythm but went back to RVR RVR likely triggered by respiratory distress and COVID-19 Cardiology consulted Amio drip -> PO Amio Cardiology recommends 1 month course of amiodarone  while respiratory illness resolves Currently 400 mg x 7 days followed by 200 daily x 21 days then stop Continues to have some breakthrough tachycardia, mostly sinus, occasionally A-fib.  Avoid beta-blockers if able due to concerns of bronchospasm Cardiology reports she is not a candidate for A-fib ablation due to lung disease and lung cancer Currently on Eliquis , will continue Echocardiogram: EF 60 to 65%, no RWMA,  diastolic parameters indeterminate.  No noted valvular disease TSH within normal limits   COPD with exacerbation COVID-19 Acute on chronic hypoxic respiratory failure COPD appears to be at end-stage Chronically on 2 L nasal cannula, maintains O2 sats above 92% on nasal cannula but is very dyspneic and tachypneic and has work of breathing with minimal exertion She has been using her home CPAP for most of the day; this is providing significant relief Repeat chest x-ray without acute changes Continue with current dose of steroids Continue with ipratropium only given RVR.  Xopenex  as needed Continue home Daliresp , ensifentrine , and Trelegy    End of life care In the setting of chronic respiratory failure, end-stage COPD, recurrence of stage III lung cancer, chronic pain, and now acute infection with COVID-19 patient's prognosis is overall poor She is able to maintain O2 sats with nasal cannula but spends the majority of her day requiring CPAP due to profound work of breathing and desaturation when on nasal cannula alone Upon further discussion she now wants to go home with home hospice Palliative care has been consulted She requests dc today with coordination with hospice   Substernal chest pain Intermittent, chronic Cardiac testing has been negative. CTA PE and CT dissection negative Pulmonology was consulted and believes this may be secondary to mass effect or radiation from malignancy Pain has been very responsive to combination of oxycodone  and Zanaflex , which are now scheduled   Stage III SCC lung cancer Initially stage I status post radiation, now with recurrence CT this admission shows enlarged precarinal lymph node 2.0 x 2.5 cm, slightly decreased in the  prior studies.  Compatible with metastasis.  Multiple additional mildly prominent mediastinal and bilateral hilar lymph nodes.  No new suspicious lung nodules.  Stable 4 x 6 mm noncalcified nodule in right upper lobe. She has  decided to proceed with home hospice   Class 3 obesity Body mass index is 45.93 kg/m.Brandi Fowler  Significantly low or high BMI is associated with higher medical risk including morbidity and mortality    Chronic pain Patient endorses that she has had difficulty following up with outpatient pain management.  She reports her doctors are located in multiple different locations and that she has been missing some appointments due to difficulty with transportation She reports constant pain No pain meds chronically in PDMP She prefers to transition to home hospice   Restless leg syndrome Continue Requip , tizanidine    OSA Continue CPAP at night   PTSD/Depression/Schizophrenia Alert and oriented x 4 throughout hospitalization without apparent internal stimuli, maintains capacity to make decisions She does not appear to be taking medications for this issue   Tobacco abuse Patient reports that she has been smoking since she is 58 years old, currently down to 3 cigarettes a day Multiple failed attempts at cessation in the past  DNR Confirmed by Lilian Register with palliative care Out of facility DNR form signed prior to dc        Consultants: Cardiology Palliative care RT   Procedures: Echocardiogram 5/15   Antibiotics: Ceftriaxone  x 1    Pain control - [place]  Controlled Substance Reporting System database was reviewed. and patient was instructed, not to drive, operate heavy machinery, perform activities at heights, swimming or participation in water activities or provide baby-sitting services while on Pain, Sleep and Anxiety Medications; until their outpatient Physician has advised to do so again. Also recommended to not to take more than prescribed Pain, Sleep and Anxiety Medications.    Disposition: Home with hospice Diet recommendation:  Regular diet DISCHARGE MEDICATION: Allergies as of 07/27/2023       Reactions   Aripiprazole Swelling   Other Reaction(s): Other (See  Comments) Other reaction(s): Edema   Baclofen Swelling   Buprenorphine-naloxone  Other (See Comments)   Other reaction(s): Other (See Comments), Other (See Comments), seizure  SEIZURE   Clonazepam Rash   Cyclobenzaprine Nausea And Vomiting, Swelling   Other reaction(s): swelling, vomiting   Doxepin Rash   Fentanyl  Nausea And Vomiting   Other reaction(s): Other (See Comments)   Lurasidone Swelling, Nausea Only   Other reaction(s): unknown   Methadone Swelling   Other reaction(s): Angioedema, n/v   Oxymorphone Other (See Comments)   Other reaction(s): Other (See Comments), Other (See Comments), Other (See Comments), seizure  SEIZURE  seizures   Penicillins Swelling   Other reaction(s): Throat swells  Other reaction(s): Edema, Other (See Comments), UNKNOWN   Pregabalin  Nausea Only, Swelling   Quetiapine Other (See Comments), Diarrhea, Nausea Only   Other reaction(s): Other (See Comments), swelling   Tolmetin Rash   Trazodone  Itching, Hives   Acetaminophen  Nausea And Vomiting, Diarrhea, Nausea Only   Other reaction(s): diarrhea, nausea, vomiting, Other (See Comments)   Bupropion Other (See Comments)   Other reaction(s): Abdominal Pain   Colestipol    Other reaction(s): blood in urine, tingling in hands, feet, legs, Other (See Comments)  Urine in blood tingling in hands feet legs   Gabapentin Nausea Only   Hydrocodone Itching, Nausea Only   Other reaction(s): rash, swelling, vomiting   Paroxetine Nausea Only   Other reaction(s): unknown   Sertraline  Diarrhea, Nausea And Vomiting   Amitriptyline Hcl    Carbamazepine Hives   Ciprofloxacin Hcl Itching   Codeine    Divalproex  Sodium Diarrhea   Etodolac Swelling   Haloperidol     Other reaction(s): Headache   Hyaluronate Sodium    Hydrochlorothiazide    unknown   Ibuprofen Other (See Comments)   Bleeding in stomach   Indocin [indomethacin]    Ketoprofen    Latuda [lurasidone Hcl]    Lidocaine  Hives   Meloxicam     Metformin And Related    unknown   Methocarbamol     unknown   Nabumetone    Naproxen Hives, Nausea Only   Ondansetron     Opana [oxymorphone Hcl] Other (See Comments)   seizures   Oxaprozin Hives   Perphenazine  Hives   Risperidone  And Related Itching   Seroquel [quetiapine Fumarate]    Silver    Suboxone [buprenorphine Hcl-naloxone  Hcl] Other (See Comments)   seizures   Tramadol    Valproic Acid Diarrhea   Zoloft [sertraline Hcl] Nausea And Vomiting   Bromfenac Rash   Buprenorphine Rash   Ciprofloxacin Itching, Other (See Comments)   Other reaction(s): Unknown   Clindamycin Rash   Clindamycin/lincomycin Rash   Flurbiprofen Swelling, Rash   Metronidazole    Other reaction(s): blood in urine, tingling in hands, legs, feet, Other (See Comments)  Blood in urine tingling in hands feet legs   Mirtazapine Itching   Other reaction(s): unknown   Other Itching, Rash   Patient reports itching and rash after oral contrast. No issues with IV contrast in the past. She has not required pre-meds (07/12/23)        Medication List     STOP taking these medications    albuterol  (2.5 MG/3ML) 0.083% nebulizer solution Commonly known as: PROVENTIL    albuterol  1.25 MG/3ML nebulizer solution Commonly known as: ACCUNEB    albuterol  108 (90 Base) MCG/ACT inhaler Commonly known as: VENTOLIN  HFA   Benzocaine  (Topical) 20 % Oint   diphenhydrAMINE  50 MG tablet Commonly known as: BENADRYL    EPINEPHrine 0.3 mg/0.3 mL Soaj injection Commonly known as: EPI-PEN   Narcan  4 MG/0.1ML Liqd nasal spray kit Generic drug: naloxone    prochlorperazine  10 MG tablet Commonly known as: COMPAZINE    triamcinolone  cream 0.1 % Commonly known as: KENALOG        TAKE these medications    acetaminophen  325 MG tablet Commonly known as: TYLENOL  Take 2 tablets (650 mg total) by mouth every 6 (six) hours as needed for mild pain (pain score 1-3) or moderate pain (pain score 4-6) (or Fever >/= 101).    amiodarone  200 MG tablet Commonly known as: PACERONE  Take 2 tablets (400 mg total) by mouth 2 (two) times daily for 3 days, THEN 1 tablet (200 mg total) daily for 21 days. Start taking on: Jul 27, 2023   Eliquis  5 MG Tabs tablet Generic drug: apixaban  Take 1 tablet (5 mg total) by mouth 2 (two) times daily.   guaiFENesin  600 MG 12 hr tablet Commonly known as: MUCINEX  Take 1 tablet (600 mg total) by mouth 2 (two) times daily.   ipratropium 17 MCG/ACT inhaler Commonly known as: ATROVENT  HFA Inhale 2 puffs into the lungs every 6 (six) hours.   levalbuterol  45 MCG/ACT inhaler Commonly known as: XOPENEX  HFA Inhale 1 puff into the lungs every 6 (six) hours.   Ohtuvayre  3 MG/2.5ML Susp Generic drug: Ensifentrine  Inhale 1 vial into the lungs in the morning and at bedtime.   oxyCODONE  15  MG immediate release tablet Commonly known as: ROXICODONE  Take 1 tablet (15 mg total) by mouth every 6 (six) hours for 5 days. What changed:  when to take this reasons to take this   OXYGEN  Inhale 2-3 L into the lungs continuous.   predniSONE  5 MG tablet Commonly known as: DELTASONE  Take 1 tablet by mouth daily with breakfast. What changed: Another medication with the same name was removed. Continue taking this medication, and follow the directions you see here.   roflumilast  500 MCG Tabs tablet Commonly known as: DALIRESP  Take 1 tablet by mouth every morning.   rOPINIRole  0.5 MG tablet Commonly known as: Requip  Take 1 tablet (0.5 mg total) by mouth at bedtime. What changed: how much to take   sulfamethoxazole-trimethoprim 400-80 MG tablet Commonly known as: BACTRIM Take 1 tablet by mouth 3 (three) times a week.   tiZANidine  4 MG tablet Commonly known as: ZANAFLEX  Take 1 tablet (4 mg total) by mouth every 6 (six) hours as needed for muscle spasms.   Trelegy Ellipta 100-62.5-25 MCG/ACT Aepb Generic drug: Fluticasone -Umeclidin-Vilant Inhale 1 puff into the lungs every morning.         Discharge Exam:   Subjective: Breathing is better currently.  She wants to go home today with hospice.  Reports chronic pain.  Her boyfriend stays with her sometimes, otherwise lives alone.   Objective: Vitals:   07/27/23 1211 07/27/23 1228  BP: (!) 196/126 (!) 162/97  Pulse: (!) 102 91  Resp:  20  Temp:  97.9 F (36.6 C)  SpO2: (!) 86% 98%    Intake/Output Summary (Last 24 hours) at 07/27/2023 1530 Last data filed at 07/27/2023 0230 Gross per 24 hour  Intake 200 ml  Output --  Net 200 ml   Filed Weights   07/20/23 2110  Weight: 125.2 kg    Exam:  General:  Appears calm and comfortable and is in NAD, chronically ill appearing, on RA Eyes:  normal lids, iris ENT:  grossly normal hearing, lips & tongue, mmm Cardiovascular:  RRR, no m/r/g. No LE edema.  Respiratory:   CTA bilaterally with no wheezes/rales/rhonchi.  Normal respiratory effort. Abdomen:  soft, NT, ND Skin:  no rash or induration seen on limited exam Musculoskeletal:  grossly normal tone BUE/BLE, good ROM, no bony abnormality Psychiatric:  blunted mood and affect, speech fluent and appropriate, AOx3 Neurologic:  CN 2-12 grossly intact, moves all extremities in coordinated fashion  Data Reviewed: I have reviewed the patient's lab results since admission.  Pertinent labs for today include:   None today    Condition at discharge: poor  The results of significant diagnostics from this hospitalization (including imaging, microbiology, ancillary and laboratory) are listed below for reference.   Imaging Studies: DG Chest Port 1 View Result Date: 07/24/2023 CLINICAL DATA:  Dyspnea, cough. EXAM: PORTABLE CHEST 1 VIEW COMPARISON:  Jul 20, 2023. FINDINGS: Stable cardiomediastinal silhouette. Right internal jugular Port-A-Cath is unchanged. Minimal bibasilar subsegmental atelectasis is noted. Bony thorax is unremarkable. IMPRESSION: Minimal bibasilar subsegmental atelectasis. Electronically Signed   By: Rosalene Colon M.D.   On: 07/24/2023 10:24   CT Angio Chest/Abd/Pel for Dissection W and/or W/WO Result Date: 07/21/2023 CLINICAL DATA:  severe substernal pleuritic chest pain. History of lung cancer. COPD. * Tracking Code: BO * EXAM: CT ANGIOGRAPHY CHEST, ABDOMEN AND PELVIS TECHNIQUE: Non-contrast CT of the chest was initially obtained. Multidetector CT imaging through the chest, abdomen and pelvis was performed using the standard protocol during bolus administration of  intravenous contrast. Multiplanar reconstructed images and MIPs were obtained and reviewed to evaluate the vascular anatomy. RADIATION DOSE REDUCTION: This exam was performed according to the departmental dose-optimization program which includes automated exposure control, adjustment of the mA and/or kV according to patient size and/or use of iterative reconstruction technique. CONTRAST:  OMNIPAQUE  IOHEXOL  350 MG/ML SOLN COMPARISON:  CT Angiography chest from 07/12/2023 and nuclear medicine PET scan from 04/22/2023 FINDINGS: CTA CHEST FINDINGS Cardiovascular: No intramural hematoma noted in the thoracic aorta on the unenhanced images. Thoracic aorta is normal in caliber without aneurysm, dissection, vasculitis or significant stenosis. There is satisfactory opacification of pulmonary artery branches. There is no embolism up to the proximal subsegmental pulmonary artery level. Normal cardiac size. No pericardial effusion. No aortic aneurysm. There are coronary artery calcifications, in keeping with coronary artery disease. There are also mild peripheral atherosclerotic vascular calcifications of thoracic aorta and its major branches. Mediastinum/Nodes: Visualized thyroid gland appears grossly unremarkable. No solid / cystic mediastinal masses. The esophagus is nondistended precluding optimal assessment. There is an enlarged precarinal lymph node measuring 2.0 x 2.5 cm, slightly decreased in size since the prior study, compatible with metastases.  There multiple additional mildly prominent mediastinal and bilateral hilar lymph nodes which do not meet the size criteria and appears slightly decreased since the prior study. No axillary lymphadenopathy by size criteria. Lungs/Pleura: The central tracheo-bronchial tree is patent. Mild upper lobe predominant centrilobular emphysematous changes. There are patchy areas of linear, plate-like atelectasis and/or scarring throughout bilateral lungs. No mass or consolidation. No pleural effusion or pneumothorax. There is a stable 4 x 6 mm noncalcified nodule in the right upper lobe (series 8, image 67). No new suspicious lung nodule. Musculoskeletal: A CT Port-a-Cath is seen in the right upper chest wall with the catheter terminating in the cavo-atrial junction region. Visualized soft tissues of the chest wall are otherwise grossly unremarkable. No suspicious osseous lesions. There are mild multilevel degenerative changes in the visualized spine. Partially seen lower cervical spinal fixation hardware. Review of the MIP images confirms the above findings. CTA ABDOMEN AND PELVIS FINDINGS VASCULAR Aorta: Normal caliber aorta without aneurysm, dissection, vasculitis or significant stenosis. Celiac: Patent without evidence of aneurysm, dissection, vasculitis or significant stenosis. SMA: Patent without evidence of aneurysm, dissection, vasculitis or significant stenosis. 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 All renal arteries are patent without evidence of aneurysm, dissection, vasculitis, fibromuscular dysplasia or significant stenosis. IMA: Patent without evidence of aneurysm, dissection, vasculitis or significant stenosis. Inflow: Patent without evidence of aneurysm, dissection, vasculitis or significant stenosis. Veins: No obvious venous abnormality within the limitations of this arterial phase study. Review of the MIP images confirms the above findings. NON-VASCULAR  Hepatobiliary: The liver is normal in size. Non-cirrhotic configuration. No suspicious mass. No intrahepatic or extrahepatic bile duct dilation. Gallbladder is surgically absent. Pancreas: Unremarkable. No pancreatic ductal dilatation or surrounding inflammatory changes. Spleen: Within normal limits. No focal lesion. Adrenals/Urinary Tract: There is a stable 2.1 x 2.7 cm right adrenal adenoma. Stable left adrenal thickening without discrete nodule. No suspicious renal mass. There is a stable partially exophytic hypoattenuating structure arising from the right kidney upper pole, which is too small to adequately characterize but unchanged since the prior PET-CT scan and was not FDG avid, favoring proteinaceous/hemorrhagic cyst. There is a smaller simple cyst in the left kidney upper pole, anteriorly. No obstructive uropathy on either side. There is small focus of dystrophic calcification along the right kidney  lower pole, posterolaterally. Nephroureterolithiasis on either side. Urinary bladder is under distended, precluding optimal assessment. However, no large mass or stones identified. No perivesical fat stranding. Stomach/Bowel: There is a small diverticulum arising from the second part of duodenum. No disproportionate dilation of the small or large bowel loops. No evidence of abnormal bowel wall thickening or inflammatory changes. The appendix is unremarkable. There are scattered diverticula throughout the colon, without imaging signs of diverticulitis. Vascular/Lymphatic: No ascites or pneumoperitoneum. No abdominal or pelvic lymphadenopathy, by size criteria. No aneurysmal dilation of the major abdominal arteries. There are mild peripheral atherosclerotic vascular calcifications of the aorta and its major branches. Reproductive: The uterus is unremarkable. No large adnexal mass. Other: There is a tiny fat containing umbilical hernia. The soft tissues and abdominal wall are otherwise unremarkable.  Musculoskeletal: No suspicious osseous lesions. There are mild multilevel degenerative changes in the visualized spine. Review of the MIP images confirms the above findings. IMPRESSION: 1. No acute aortic syndrome. No acute thoracic aortic intramural hematoma. No thoracoabdominal aortic aneurysm, dissection, penetrating atherosclerotic ulcer or vasculitis. No pulmonary embolism. 2. No acute inflammatory process identified within the chest, abdomen or pelvis. 3. There is a stable 4 x 6 mm noncalcified nodule in the right upper lobe. No new suspicious lung nodule. 4. Slight interval decrease in the precarinal lymph node, currently measuring 2.0 x 2.5 cm, compatible with improving metastasis. 5. Stable right adrenal adenoma. 6. Multiple other nonacute observations, as described above. Aortic Atherosclerosis (ICD10-I70.0). Electronically Signed   By: Beula Brunswick M.D.   On: 07/21/2023 14:24   ECHOCARDIOGRAM COMPLETE Result Date: 07/21/2023    ECHOCARDIOGRAM REPORT   Patient Name:   Brandi Fowler Date of Exam: 07/21/2023 Medical Rec #:  [id number]        Height:       65.0 in Accession #:    [id number]       Weight:       276.0 lb Date of Birth:  1965/08/18        BSA:          2.268 m Patient Age:    58 years         BP:           108/76 mmHg Patient Gender: F                HR:           83 bpm. Exam Location:  [facility] Procedure: 2D Echo, Cardiac Doppler and Color Doppler (Both Spectral and Color            Flow Doppler were utilized during procedure). Indications:     Atrial Fibrillation I48.91  History:         Patient has no prior history of Echocardiogram examinations.                  COPD. Lung cancer.  Sonographer:     Broadus Canes Referring Phys:  [id number] Devorah Fonder Diagnosing Phys: Belva Boyden MD  Sonographer Comments: Technically challenging study due to limited acoustic windows, suboptimal parasternal window and suboptimal apical window. IMPRESSIONS  1. Left ventricular ejection fraction, by  estimation, is 60 to 65%. The left ventricle has normal function. The left ventricle has no regional wall motion abnormalities. Left ventricular diastolic parameters are indeterminate.  2. Right ventricular systolic function is normal. The right ventricular size is normal. There is normal pulmonary artery systolic pressure. The estimated right ventricular systolic pressure is 20.1  mmHg.  3. The mitral valve is normal in structure. No evidence of mitral valve regurgitation. No evidence of mitral stenosis.  4. The aortic valve is normal in structure. Aortic valve regurgitation is not visualized. No aortic stenosis is present.  5. The inferior vena cava is dilated in size with >50% respiratory variability, suggesting right atrial pressure of 8 mmHg.  6. Rhythm is atrial fibrillation with RVR FINDINGS  Left Ventricle: Left ventricular ejection fraction, by estimation, is 60 to 65%. The left ventricle has normal function. The left ventricle has no regional wall motion abnormalities. Strain was performed and the global longitudinal strain is indeterminate. The left ventricular internal cavity size was normal in size. There is no left ventricular hypertrophy. Left ventricular diastolic parameters are indeterminate. Right Ventricle: The right ventricular size is normal. No increase in right ventricular wall thickness. Right ventricular systolic function is normal. There is normal pulmonary artery systolic pressure. The tricuspid regurgitant velocity is 1.94 m/s, and  with an assumed right atrial pressure of 5 mmHg, the estimated right ventricular systolic pressure is 20.1 mmHg. Left Atrium: Left atrial size was normal in size. Right Atrium: Right atrial size was normal in size. Pericardium: There is no evidence of pericardial effusion. Mitral Valve: The mitral valve is normal in structure. No evidence of mitral valve regurgitation. No evidence of mitral valve stenosis. Tricuspid Valve: The tricuspid valve is normal in  structure. Tricuspid valve regurgitation is mild . No evidence of tricuspid stenosis. Aortic Valve: The aortic valve is normal in structure. Aortic valve regurgitation is not visualized. No aortic stenosis is present. Aortic valve mean gradient measures 4.5 mmHg. Aortic valve peak gradient measures 7.6 mmHg. Aortic valve area, by VTI measures 3.64 cm. Pulmonic Valve: The pulmonic valve was normal in structure. Pulmonic valve regurgitation is not visualized. No evidence of pulmonic stenosis. Aorta: The aortic root is normal in size and structure. Venous: The inferior vena cava is dilated in size with greater than 50% respiratory variability, suggesting right atrial pressure of 8 mmHg. IAS/Shunts: No atrial level shunt detected by color flow Doppler. Additional Comments: 3D was performed not requiring image post processing on an independent workstation and was indeterminate.  LEFT VENTRICLE PLAX 2D LVIDd:         3.50 cm LVIDs:         2.50 cm LV PW:         0.90 cm LV IVS:        1.20 cm LVOT diam:     2.10 cm LV SV:         66 LV SV Index:   29 LVOT Area:     3.46 cm  LEFT ATRIUM           Index LA Vol (A2C): 43.2 ml 19.04 ml/m LA Vol (A4C): 40.4 ml 17.81 ml/m  AORTIC VALVE AV Area (Vmax):    3.15 cm AV Area (Vmean):   3.09 cm AV Area (VTI):     3.64 cm AV Vmax:           137.50 cm/s AV Vmean:          91.900 cm/s AV VTI:            0.181 m AV Peak Grad:      7.6 mmHg AV Mean Grad:      4.5 mmHg LVOT Vmax:         125.00 cm/s LVOT Vmean:        82.100 cm/s LVOT VTI:  0.190 m LVOT/AV VTI ratio: 1.05 MITRAL VALVE                TRICUSPID VALVE MV Area (PHT): 4.99 cm     TR Peak grad:   15.1 mmHg MV Decel Time: 152 msec     TR Vmax:        194.00 cm/s MV E velocity: 139.00 cm/s                             SHUNTS                             Systemic VTI:  0.19 m                             Systemic Diam: 2.10 cm Belva Boyden MD Electronically signed by Belva Boyden MD Signature Date/Time:  07/21/2023/10:59:25 AM    Final    DG Chest Port 1 View Result Date: 07/20/2023 CLINICAL DATA:  Shortness of breath. EXAM: PORTABLE CHEST 1 VIEW COMPARISON:  Jul 14, 2023 FINDINGS: There stable right-sided venous Port-A-Cath positioning. The cardiac silhouette is mildly enlarged and unchanged in size. Mild atelectatic changes are noted within the bilateral lung bases. No pleural effusion or pneumothorax is identified. The visualized skeletal structures are unremarkable. IMPRESSION: Stable cardiomegaly with mild bibasilar atelectasis. Electronically Signed   By: Virgle Grime M.D.   On: 07/20/2023 21:43   DG Chest 2 View Result Date: 07/14/2023 CLINICAL DATA:  Chest pain and shortness of breath. EXAM: CHEST - 2 VIEW COMPARISON:  Chest radiograph dated 05/13/2023 FINDINGS: Right-sided Port-A-Cath with tip at the cavoatrial junction. There is mild cardiomegaly and mild vascular congestion. No focal consolidation, pleural effusion, pneumothorax. Lower cervical ACDF. No acute osseous pathology. IMPRESSION: Mild cardiomegaly and mild vascular congestion. No focal consolidation. Electronically Signed   By: Angus Bark M.D.   On: 07/14/2023 11:03   CT Angio Chest Pulmonary Embolism (PE) W or WO Contrast Result Date: 07/12/2023 CLINICAL DATA:  Mid sternal chest pain with hemoptysis. Evaluate for pulmonary embolism. On chemotherapy for lung cancer. EXAM: CT ANGIOGRAPHY CHEST WITH CONTRAST TECHNIQUE: Multidetector CT imaging of the chest was performed using the standard protocol during bolus administration of intravenous contrast. Multiplanar CT image reconstructions and MIPs were obtained to evaluate the vascular anatomy. RADIATION DOSE REDUCTION: This exam was performed according to the departmental dose-optimization program which includes automated exposure control, adjustment of the mA and/or kV according to patient size and/or use of iterative reconstruction technique. CONTRAST:  75mL OMNIPAQUE  IOHEXOL  350  MG/ML SOLN COMPARISON:  September 04, 2023 FINDINGS: Cardiovascular: Satisfactory opacification of the pulmonary arteries to the segmental level. No evidence of pulmonary embolism. Normal heart size. No pericardial effusion. Mediastinum/Nodes: Comparison with prior examination accounting for differences in techniques no change in the large retrocaval pretracheal mass which correlate with adenopathy measuring 2.9 x 2.6 cm. No other significant mediastinal or hilar adenopathy. Lungs/Pleura: Prominence of the interstitial markings with centrilobular emphysema. Comparison with prior examinations demonstrates stable 5 mm nodule in the right upper lobe anterior segment image 72. No other suspicious pulmonary nodules. Upper Abdomen: No change in the right adrenal gland low-attenuation mass that correlate with adrenal adenoma. Musculoskeletal: Choose 1.  No bony lesions along the sternum. Review of the MIP images confirms the above findings. IMPRESSION: *No evidence of pulmonary embolism. *Stable mediastinal  adenopathy. *Stable 5 mm nodule in the right upper lobe. *Stable right adrenal adenoma. *Emphysema. *No bony lesions along the sternum. *No change in the right adrenal gland low-attenuation mass that correlate with adrenal adenoma. Electronically Signed   By: Fredrich Jefferson M.D.   On: 07/12/2023 15:09    Microbiology: Results for orders placed or performed during the hospital encounter of 07/20/23  Blood culture (routine x 2)     Status: None   Collection Time: 07/20/23  9:13 PM   Specimen: BLOOD  Result Value Ref Range Status   Specimen Description BLOOD BLOOD LEFT ARM  Final   Special Requests   Final    BOTTLES DRAWN AEROBIC AND ANAEROBIC Blood Culture results may not be optimal due to an inadequate volume of blood received in culture bottles   Culture   Final    NO GROWTH 5 DAYS Performed at [facility], [address] [place], [place] [address]    Report Status 07/25/2023 FINAL  Final  Blood  culture (routine x 2)     Status: None   Collection Time: 07/20/23  9:23 PM   Specimen: BLOOD  Result Value Ref Range Status   Specimen Description BLOOD BLOOD RIGHT ARM  Final   Special Requests   Final    BOTTLES DRAWN AEROBIC AND ANAEROBIC Blood Culture results may not be optimal due to an inadequate volume of blood received in culture bottles   Culture   Final    NO GROWTH 5 DAYS Performed at [facility], [address] [place], [place] [address]    Report Status 07/25/2023 FINAL  Final  SARS Coronavirus 2 by RT PCR (hospital order, performed in [facility] hospital lab) *cepheid single result test* Anterior Nasal Swab     Status: Abnormal   Collection Time: 07/21/23  8:45 AM   Specimen: Anterior Nasal Swab  Result Value Ref Range Status   SARS Coronavirus 2 by RT PCR POSITIVE (A) NEGATIVE Final    Comment: (NOTE) SARS-CoV-2 target nucleic acids are DETECTED  SARS-CoV-2 RNA is generally detectable in upper respiratory specimens  during the acute phase of infection.  Positive results are indicative  of the presence of the identified virus, but do not rule out bacterial infection or co-infection with other pathogens not detected by the test.  Clinical correlation with patient history and  other diagnostic information is necessary to determine patient infection status.  The expected result is negative.  Fact Sheet for Patients:   RoadLapTop.co.za   Fact Sheet for Healthcare Providers:   http://kim-miller.com/    This test is not yet approved or cleared by the [place]  FDA and  has been authorized for detection and/or diagnosis of SARS-CoV-2 by FDA under an Emergency Use Authorization (EUA).  This EUA will remain in effect (meaning this test can be used) for the duration of  the COVID-19 declaration under Section 564(b)(1)  of the Act, 21 U.S.C. section 360-bbb-3(b)(1), unless the authorization is terminated or  revoked sooner.   Performed at [facility], [address] [place], [place] [address]   Respiratory (~20 pathogens) panel by PCR     Status: None   Collection Time: 07/21/23  9:15 AM   Specimen: Nasopharyngeal Swab; Respiratory  Result Value Ref Range Status   Adenovirus NOT DETECTED NOT DETECTED Final   Coronavirus 229E NOT DETECTED NOT DETECTED Final    Comment: (NOTE) The Coronavirus on the Respiratory Panel, DOES NOT test for the novel  Coronavirus (  2019 nCoV)    Coronavirus HKU1 NOT DETECTED NOT DETECTED Final   Coronavirus NL63 NOT DETECTED NOT DETECTED Final   Coronavirus OC43 NOT DETECTED NOT DETECTED Final   Metapneumovirus NOT DETECTED NOT DETECTED Final   Rhinovirus / Enterovirus NOT DETECTED NOT DETECTED Final   Influenza A NOT DETECTED NOT DETECTED Final   Influenza B NOT DETECTED NOT DETECTED Final   Parainfluenza Virus 1 NOT DETECTED NOT DETECTED Final   Parainfluenza Virus 2 NOT DETECTED NOT DETECTED Final   Parainfluenza Virus 3 NOT DETECTED NOT DETECTED Final   Parainfluenza Virus 4 NOT DETECTED NOT DETECTED Final   Respiratory Syncytial Virus NOT DETECTED NOT DETECTED Final   Bordetella pertussis NOT DETECTED NOT DETECTED Final   Bordetella Parapertussis NOT DETECTED NOT DETECTED Final   Chlamydophila pneumoniae NOT DETECTED NOT DETECTED Final   Mycoplasma pneumoniae NOT DETECTED NOT DETECTED Final    Comment: Performed at [facility], [address], [place], [place] [address]    Labs: CBC: Recent Labs  Lab 07/20/23 2113 07/21/23 0450 07/22/23 0550  WBC 4.7 3.2* 4.6  HGB 12.4 12.2 11.3*  HCT 36.5 38.2 32.9*  MCV 94.3 99.5 94.3  PLT 97* 86* 87*   Basic Metabolic Panel: Recent Labs  Lab 07/22/23 0550 07/23/23 0457 07/24/23 0509 07/25/23 0320 07/26/23 0430  NA 139 136 140 138 141  K 3.8 3.7 3.6 3.8 3.7  CL 107 103 105 103 105  CO2 22 23 26 26 28   GLUCOSE 104* 107* 114* 144* 95  BUN 17 16 21* 32* 27*  CREATININE 0.68  0.61 0.68 0.94 0.66  CALCIUM 8.3* 8.5* 8.6* 8.7* 8.7*  MG  --   --   --  1.9  --    Liver Function Tests: Recent Labs  Lab 07/20/23 2113  AST 30  ALT 39  ALKPHOS 67  BILITOT 0.8  PROT 6.6  ALBUMIN 3.0*   CBG: No results for input(s): "GLUCAP" in the last 168 hours.  Discharge time spent: greater than 30 minutes.  Signed: Lorita Rosa, MD [facility] 07/27/2023

## 2023-07-27 NOTE — TOC Progression Note (Signed)
 Transition of Care [facility]) - Progression Note    Patient Details  Name: Brandi Fowler MRN: [id number] Date of Birth: [birth year]  [facility] CM/SW Contact  Alexandra Ice, RN Phone Number: 07/27/2023, 3:48 PM  Clinical Narrative:    Patient to discharge today, home with hospice services. TOC contacted patient via phone confirmed patient today. Will need EMS transport. She stated her daughter will be here at 5pm to pick up her belongings. TOC spoke with Blaise Bumps at [facility], scheduled for pick up around 5:00pm.    Expected Discharge Plan: Home/Self Care Barriers to Discharge: Continued Medical Work up  Expected Discharge Plan and Services     Post Acute Care Choice: NA Living arrangements for the past 2 months: Single Family Home Expected Discharge Date: 07/27/23                                     Social Determinants of Health (SDOH) Interventions SDOH Screenings   Food Insecurity: No Food Insecurity (07/21/2023)  Housing: Low Risk  (07/21/2023)  Transportation Needs: No Transportation Needs (07/21/2023)  Utilities: Not At Risk (07/21/2023)  Financial Resource Strain: Low Risk  (03/29/2023)   Received from [facility]  Physical Activity: Insufficiently Active (08/31/2018)  Social Connections: Moderately Isolated (07/21/2023)  Stress: Stress Concern Present (08/31/2018)  Tobacco Use: High Risk (07/21/2023)  Health Literacy: Low Risk  (06/14/2020)   Received from [facility]    Readmission Risk Interventions    07/25/2023    3:29 PM 07/25/2023   11:01 AM  Readmission Risk Prevention Plan  Transportation Screening Complete Complete  PCP or Specialist Appt within 3-5 Days Complete Complete  Social Work Consult for Recovery Care Planning/Counseling Complete   Palliative Care Screening Not Applicable   Medication Review Oceanographer) Complete

## 2023-07-27 NOTE — Hospital Course (Signed)
 58yo with h/o COPD, lung CA, schizophrenia, OSA, seizure d/o, and PVD who presented on 5/14 with SOB, cough.  She was found to be in afib with RVR into the 200s and was cardioverted with return to sinus rhythm.  She was found to have acute hypoxic respiratory failure associated with COVID-19 infection.  She is considering a transition to full comfort measures.

## 2023-07-27 NOTE — Progress Notes (Signed)
   07/27/23 1536  Assess: MEWS Score  BP (!) 152/114  MAP (mmHg) 124  Pulse Rate (!) 150  ECG Heart Rate (!) 151  Resp 14  Level of Consciousness Alert  SpO2 92 %  O2 Device Nasal Cannula  Patient Activity (if Appropriate) In bed  O2 Flow Rate (L/min) 4 L/min  Assess: MEWS Score  MEWS Temp 0  MEWS Systolic 0  MEWS Pulse 3  MEWS RR 0  MEWS LOC 0  MEWS Score 3  MEWS Score Color Yellow  Assess: if the MEWS score is Yellow or Red  Were vital signs accurate and taken at a resting state? Yes  Does the patient meet 2 or more of the SIRS criteria? Yes  Does the patient have a confirmed or suspected source of infection? Yes  MEWS guidelines implemented  Yes, yellow  Treat  MEWS Interventions Considered administering scheduled or prn medications/treatments as ordered  Take Vital Signs  Increase Vital Sign Frequency  Yellow: Q2hr x1, continue Q4hrs until patient remains green for 12hrs  Escalate  MEWS: Escalate Yellow: Discuss with charge nurse and consider notifying provider and/or RRT  Notify: Charge Nurse/RN  Name of Charge Nurse/RN Wilburn Handler, RN  Provider Notification  Provider Name/Title Lorita Rosa  Date Provider Notified 07/27/23  Time Provider Notified 1536  Method of Notification Page  Notification Reason Other (Comment) (Yellow MEWS, Dysrhythmia)  Provider response No new orders  Date of Provider Response 07/27/23  Time of Provider Response 1538  Assess: SIRS CRITERIA  SIRS Temperature  0  SIRS Respirations  0  SIRS Pulse 1  SIRS WBC 0  SIRS Score Sum  1

## 2023-07-27 NOTE — TOC Progression Note (Addendum)
 Transition of Care [facility]) - Progression Note    Patient Details  Name: Brandi Fowler MRN: [id number] Date of Birth: [birth year]  [facility]) CM/SW Contact  Arminda Landmark, RN Phone Number: 07/27/2023, 12:08 PM  Clinical Narrative:    Received epic chat from Dr. Lorita Rosa that this pt wants to go home with hospice and asked this RNCM to speak with her. Pt states she was told she probably as less than 6 months to live and is in constant severe pain. She has lung CA. She wants to go home with hospice care and feels she's able to manage getting to the bathroom, kitchen, and back to the chair or bed. She's unsure which hospice company to use and stated, "please pick a good one". Came out of the room and her oncologist NP is here. They didn't know she was consulted for hospice and will speak with her first. If she moves forward they are recommending [facility] hospice. 1230: Oncologist NP Melodie Spry is in agreement with hospice and Fredrik Jensen from [facility] will come evaluate her today. TOC to continue to follow.    Expected Discharge Plan: Home/Self Care Barriers to Discharge: Continued Medical Work up  Expected Discharge Plan and Services     Post Acute Care Choice: NA Living arrangements for the past 2 months: Single Family Home                                       Social Determinants of Health (SDOH) Interventions SDOH Screenings   Food Insecurity: No Food Insecurity (07/21/2023)  Housing: Low Risk  (07/21/2023)  Transportation Needs: No Transportation Needs (07/21/2023)  Utilities: Not At Risk (07/21/2023)  Financial Resource Strain: Low Risk  (03/29/2023)   [facility]  Physical Activity: Insufficiently Active (08/31/2018)  Social Connections: Moderately Isolated (07/21/2023)  Stress: Stress Concern Present (08/31/2018)  Tobacco Use: High Risk (07/21/2023)  Health Literacy: Low Risk  (06/14/2020)   [facility] from [facility]    Readmission Risk Interventions    07/25/2023    3:29 PM 07/25/2023   11:01 AM  Readmission Risk Prevention Plan  Transportation Screening Complete Complete  PCP or Specialist Appt within 3-5 Days Complete Complete  Social Work Consult for Recovery Care Planning/Counseling Complete   Palliative Care Screening Not Applicable   Medication Review Oceanographer) Complete

## 2023-07-27 NOTE — Plan of Care (Signed)
  Problem: Clinical Measurements: Goal: Respiratory complications will improve Outcome: Progressing   Problem: Activity: Goal: Risk for activity intolerance will decrease Outcome: Progressing   Problem: Nutrition: Goal: Adequate nutrition will be maintained Outcome: Progressing   Problem: Coping: Goal: Level of anxiety will decrease Outcome: Progressing   Problem: Pain Managment: Goal: General experience of comfort will improve and/or be controlled Outcome: Progressing   Problem: Safety: Goal: Ability to remain free from injury will improve Outcome: Progressing

## 2023-07-28 ENCOUNTER — Inpatient Hospital Stay

## 2023-07-28 ENCOUNTER — Encounter: Payer: Self-pay | Admitting: *Deleted

## 2023-07-28 ENCOUNTER — Ambulatory Visit

## 2023-07-28 ENCOUNTER — Inpatient Hospital Stay: Admitting: Nurse Practitioner

## 2023-07-29 ENCOUNTER — Ambulatory Visit

## 2023-07-29 ENCOUNTER — Inpatient Hospital Stay (HOSPITAL_BASED_OUTPATIENT_CLINIC_OR_DEPARTMENT_OTHER): Admitting: Hospice and Palliative Medicine

## 2023-07-29 ENCOUNTER — Other Ambulatory Visit: Payer: Self-pay | Admitting: Hospice and Palliative Medicine

## 2023-07-29 DIAGNOSIS — C3412 Malignant neoplasm of upper lobe, left bronchus or lung: Secondary | ICD-10-CM

## 2023-07-29 MED ORDER — OXYCODONE HCL 15 MG PO TABS: 15.0000 mg | ORAL_TABLET | Freq: Four times a day (QID) | ORAL | 0 refills | Status: AC

## 2023-07-29 NOTE — Progress Notes (Signed)
 I spoke with hospice nurse, Sondra. Rx refilled for oxycodone .

## 2023-07-29 NOTE — Progress Notes (Signed)
VM left for patient. Will reschedule.

## 2023-08-02 ENCOUNTER — Ambulatory Visit

## 2023-08-03 ENCOUNTER — Ambulatory Visit

## 2023-08-03 NOTE — Radiation Completion Notes (Signed)
 Patient Name: LITTLE, BASHORE MRN: [id number] Date of Birth: [birth year] Referring Physician: Emaline Handsome, M.D. Date of Service: 2023-08-03 Radiation Oncologist: Glenis Langdon, M.D. [facility]                             RADIATION ONCOLOGY END OF TREATMENT NOTE     Diagnosis: C34.11 Malignant neoplasm of upper lobe, right bronchus or lung Staging on 2023-04-24: Primary cancer of left upper lobe of lung (HCC) T=pT1c, N=pN2b, M=cM0 Intent: Curative     HPI: Patient is a 58 year old female now out 7 months having completed SBRT to right upper lobe for stage I non-small cell lung cancer..  She now has developed increasing and enlarging mediastinal and right hilar adenopathy which is on PET scan and February hypermetabolic and showing progressive disease with her chest.  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  At this time she is stage IIIa disease.  She also has a history of chronic respiratory failure is on 2 L of home oxygen  continuously.  She has been seen by medical oncology with recommendation for concurrent chemoradiation.  She is seen today for radiation oncology evaluation.  Her breathing is stable she has been having a chronic cough with occasional intermittent hemoptysis.  Her p.o. intake is good her weight appears stable.      ==========DELIVERED PLANS==========  First Treatment Date: 2023-06-14 Last Treatment Date: 2023-07-18   Plan Name: Lung_R Site: Lung, Right Technique: IMRT Mode: Photon Dose Per Fraction: 2 Gy Prescribed Dose (Delivered / Prescribed): 42 Gy / 66 Gy Prescribed Fxs (Delivered / Prescribed): 21 / 33     ==========ON TREATMENT VISIT DATES========== 2023-06-14, 2023-06-21, 2023-06-28, 2023-07-06, 2023-07-12     ==========UPCOMING VISITS========== 08/29/2023 [facility]ALLI[facility]    Borders, Carlene Che, NP  08/29/2023 [facility] EST PT  15 Gwyn Leos, MD  08/29/2023 [facility] PORT FLUSH W/LAB [facility]-PORT FLUSH        ==========APPENDIX - ON TREATMENT VISIT NOTES==========   See weekly On Treatment Notes in Epic for details in the Media tab (listed as Progress notes on the On Treatment Visit Dates listed above).

## 2023-08-04 ENCOUNTER — Ambulatory Visit

## 2023-08-04 ENCOUNTER — Ambulatory Visit: Admitting: Internal Medicine

## 2023-08-04 ENCOUNTER — Encounter: Payer: Self-pay | Admitting: Internal Medicine

## 2023-08-04 ENCOUNTER — Other Ambulatory Visit

## 2023-08-04 NOTE — Progress Notes (Signed)
 Patient enrolled in services on 07/08/2023.  Spoke with patient on 07/27/2023 and she informed us  she was transferring to hospice care. Patient has been discharged from [facility].

## 2023-08-05 ENCOUNTER — Ambulatory Visit

## 2023-08-06 ENCOUNTER — Other Ambulatory Visit: Payer: Self-pay | Admitting: Radiation Oncology

## 2023-08-06 DIAGNOSIS — C3412 Malignant neoplasm of upper lobe, left bronchus or lung: Secondary | ICD-10-CM

## 2023-08-06 DIAGNOSIS — F209 Schizophrenia, unspecified: Secondary | ICD-10-CM | POA: Diagnosis not present

## 2023-08-08 ENCOUNTER — Ambulatory Visit

## 2023-08-09 ENCOUNTER — Encounter

## 2023-08-09 ENCOUNTER — Ambulatory Visit

## 2023-08-10 ENCOUNTER — Encounter

## 2023-08-10 ENCOUNTER — Ambulatory Visit

## 2023-08-11 ENCOUNTER — Encounter

## 2023-08-12 ENCOUNTER — Ambulatory Visit

## 2023-08-15 ENCOUNTER — Ambulatory Visit

## 2023-08-29 ENCOUNTER — Inpatient Hospital Stay: Admitting: Internal Medicine

## 2023-08-29 ENCOUNTER — Inpatient Hospital Stay: Admitting: Hospice and Palliative Medicine

## 2023-08-29 ENCOUNTER — Inpatient Hospital Stay

## 2023-08-31 ENCOUNTER — Other Ambulatory Visit: Payer: Self-pay | Admitting: Internal Medicine

## 2023-10-19 ENCOUNTER — Encounter: Payer: Self-pay | Admitting: Internal Medicine

## 2023-11-30 ENCOUNTER — Other Ambulatory Visit: Payer: Self-pay | Admitting: Internal Medicine

## 2024-01-24 ENCOUNTER — Telehealth: Payer: Self-pay | Admitting: Internal Medicine

## 2024-01-24 NOTE — Telephone Encounter (Signed)
 Haley-I got a message from Dr. DELENA- [facility] the patient was to follow-up with us .  If I remember she was in hospice-please check with her any reason why she wants to follow-up with us  at this time.  Please let me know we can schedule appointment-based on her requirement/preference  GB
